# Patient Record
Sex: Female | Born: 1935 | Race: White | Hispanic: No | Smoking: Former smoker
Health system: Southern US, Community
[De-identification: ages and names within clinical notes are randomized; demographics above are authoritative.]

## PROBLEM LIST (undated history)

## (undated) DIAGNOSIS — K759 Inflammatory liver disease, unspecified: Secondary | ICD-10-CM

## (undated) DIAGNOSIS — F419 Anxiety disorder, unspecified: Secondary | ICD-10-CM

## (undated) DIAGNOSIS — K219 Gastro-esophageal reflux disease without esophagitis: Secondary | ICD-10-CM

## (undated) DIAGNOSIS — I10 Essential (primary) hypertension: Secondary | ICD-10-CM

## (undated) DIAGNOSIS — F32A Depression, unspecified: Secondary | ICD-10-CM

## (undated) DIAGNOSIS — M1990 Unspecified osteoarthritis, unspecified site: Secondary | ICD-10-CM

## (undated) DIAGNOSIS — J45909 Unspecified asthma, uncomplicated: Secondary | ICD-10-CM

## (undated) DIAGNOSIS — N390 Urinary tract infection, site not specified: Secondary | ICD-10-CM

## (undated) DIAGNOSIS — R011 Cardiac murmur, unspecified: Secondary | ICD-10-CM

## (undated) DIAGNOSIS — F329 Major depressive disorder, single episode, unspecified: Secondary | ICD-10-CM

## (undated) DIAGNOSIS — J189 Pneumonia, unspecified organism: Secondary | ICD-10-CM

## (undated) HISTORY — PX: OTHER SURGICAL HISTORY: SHX169

## (undated) HISTORY — PX: JOINT REPLACEMENT: SHX530

## (undated) HISTORY — PX: EYE SURGERY: SHX253

---

## 2013-10-04 DIAGNOSIS — J189 Pneumonia, unspecified organism: Secondary | ICD-10-CM

## 2013-10-04 HISTORY — DX: Pneumonia, unspecified organism: J18.9

## 2014-04-25 ENCOUNTER — Ambulatory Visit (INDEPENDENT_AMBULATORY_CARE_PROVIDER_SITE_OTHER): Payer: Medicare Other | Admitting: Sports Medicine

## 2014-04-25 ENCOUNTER — Telehealth: Payer: Self-pay | Admitting: *Deleted

## 2014-04-25 ENCOUNTER — Ambulatory Visit (INDEPENDENT_AMBULATORY_CARE_PROVIDER_SITE_OTHER): Payer: Medicare Other

## 2014-04-25 ENCOUNTER — Encounter: Payer: Self-pay | Admitting: Sports Medicine

## 2014-04-25 VITALS — BP 125/85 | HR 86 | Ht 66.0 in | Wt 167.0 lb

## 2014-04-25 DIAGNOSIS — M47816 Spondylosis without myelopathy or radiculopathy, lumbar region: Secondary | ICD-10-CM | POA: Insufficient documentation

## 2014-04-25 DIAGNOSIS — M47817 Spondylosis without myelopathy or radiculopathy, lumbosacral region: Secondary | ICD-10-CM

## 2014-04-25 DIAGNOSIS — M1710 Unilateral primary osteoarthritis, unspecified knee: Secondary | ICD-10-CM

## 2014-04-25 DIAGNOSIS — M25469 Effusion, unspecified knee: Secondary | ICD-10-CM

## 2014-04-25 DIAGNOSIS — M1712 Unilateral primary osteoarthritis, left knee: Secondary | ICD-10-CM | POA: Insufficient documentation

## 2014-04-25 DIAGNOSIS — M545 Low back pain, unspecified: Secondary | ICD-10-CM

## 2014-04-25 DIAGNOSIS — M439 Deforming dorsopathy, unspecified: Secondary | ICD-10-CM

## 2014-04-25 DIAGNOSIS — IMO0002 Reserved for concepts with insufficient information to code with codable children: Secondary | ICD-10-CM

## 2014-04-25 DIAGNOSIS — M5137 Other intervertebral disc degeneration, lumbosacral region: Secondary | ICD-10-CM

## 2014-04-25 DIAGNOSIS — Z299 Encounter for prophylactic measures, unspecified: Secondary | ICD-10-CM | POA: Insufficient documentation

## 2014-04-25 MED ORDER — PREGABALIN 300 MG PO CAPS: 300.0000 mg | ORAL_CAPSULE | Freq: Two times a day (BID) | ORAL | Status: DC

## 2014-04-25 MED ORDER — OXYCODONE-ACETAMINOPHEN 10-325 MG PO TABS: 1.0000 | ORAL_TABLET | Freq: Three times a day (TID) | ORAL | Status: DC | PRN

## 2014-04-25 NOTE — Telephone Encounter (Signed)
[facility] [id number] [facility] valid 04/25/14-06/09/14. [phone]. Corliss SkainsJamie Kaori Jumper, CMA

## 2014-04-25 NOTE — Progress Notes (Signed)
  Subjective:    CC: Establish care.   HPI:  Low back pain: Carney BernJean has a history of lumbar degenerative disc disease, she has had epidurals in the past in her radicular symptoms have resolved, she is unable to tell me where they were placed. Unfortunately she continues to have axial back pain it's worse when standing up and better with sitting and flexion. Pain is moderate, persistent without radiation.  Left knee osteoarthritis : history of a right knee replacement, continues to have pain and swelling in the left knee, moderate, persistent, hasn't had an injection for years. Pain is localized to the joint line, without mechanical symptoms.  Past medical history, Surgical history, Family history not pertinant except as noted below, Social history, Allergies, and medications have been entered into the medical record, reviewed, and no changes needed.   Review of Systems: No headache, visual changes, nausea, vomiting, diarrhea, constipation, dizziness, abdominal pain, skin rash, fevers, chills, night sweats, swollen lymph nodes, weight loss, chest pain, body aches, joint swelling, muscle aches, shortness of breath, mood changes, visual or auditory hallucinations.  Objective:    General: Well Developed, well nourished, and in no acute distress.  Neuro: Alert and oriented x3, extra-ocular muscles intact, sensation grossly intact.  HEENT: Normocephalic, atraumatic, pupils equal round reactive to light, neck supple, no masses, no lymphadenopathy, thyroid nonpalpable.  Skin: Warm and dry, no rashes noted.  Cardiac: Regular rate and rhythm, no murmurs rubs or gallops.  Respiratory: Clear to auscultation bilaterally. Not using accessory muscles, speaking in full sentences.  Abdominal: Soft, nontender, nondistended, positive bowel sounds, no masses, no organomegaly.  Left Knee: Normal to inspection with no erythema or effusion or obvious bony abnormalities. Palpation normal with no warmth or joint line  tenderness or patellar tenderness or condyle tenderness. ROM normal in flexion and extension and lower leg rotation. Ligaments with solid consistent endpoints including ACL, PCL, LCL, MCL. Negative Mcmurray's and provocative meniscal tests. Non painful patellar compression. Patellar and quadriceps tendons unremarkable. Hamstring and quadriceps strength is normal.  Procedure: Real-time Ultrasound Guided Injection of left knee Device: GE Logiq E  Verbal informed consent obtained.  Time-out conducted.  Noted no overlying erythema, induration, or other signs of local infection.  Skin prepped in a sterile fashion.  Local anesthesia: Topical Ethyl chloride.  With sterile technique and under real time ultrasound guidance:  2 cc kenalog 40, 4 cc lidocaine injected easily, syringe switched and 30 mg/2 mL of OrthoVisc (sodium hyaluronate) in a prefilled syringe was injected easily into the knee through a 22-gauge needle. Completed without difficulty  Pain immediately resolved suggesting accurate placement of the medication.  Advised to call if fevers/chills, erythema, induration, drainage, or persistent bleeding.  Images permanently stored and available for review in the ultrasound unit.  Impression: Technically successful ultrasound guided injection.  Impression and Recommendations:    The patient was counselled, risk factors were discussed, anticipatory guidance given.

## 2014-04-25 NOTE — Assessment & Plan Note (Signed)
Starting Visco supplementation and steroid injection today. Return in one week for OrthoVisc injection #2.

## 2014-04-25 NOTE — Assessment & Plan Note (Signed)
Symptoms do sound like spinal stenosis. She has been taking Lyrica and oxycodone. Advised to minimize use of Percocet, we will increase Lyrica 300 mg. We are going to also obtain a new MRI, she has been getting epidurals, I do suspect there is also an element of facet arthritis and spinal stenosis. Return to follow up results of the MRI.

## 2014-04-25 NOTE — Assessment & Plan Note (Signed)
We will discuss this at a future visit 

## 2014-05-02 ENCOUNTER — Ambulatory Visit (INDEPENDENT_AMBULATORY_CARE_PROVIDER_SITE_OTHER): Payer: Medicare Other | Admitting: Sports Medicine

## 2014-05-02 ENCOUNTER — Encounter: Payer: Self-pay | Admitting: Sports Medicine

## 2014-05-02 VITALS — BP 112/75 | HR 92 | Ht 66.0 in | Wt 171.0 lb

## 2014-05-02 DIAGNOSIS — M545 Low back pain, unspecified: Secondary | ICD-10-CM

## 2014-05-02 DIAGNOSIS — M1710 Unilateral primary osteoarthritis, unspecified knee: Secondary | ICD-10-CM

## 2014-05-02 DIAGNOSIS — M1712 Unilateral primary osteoarthritis, left knee: Secondary | ICD-10-CM

## 2014-05-02 NOTE — Progress Notes (Signed)
  Procedure:  Injection of left knee Consent obtained and verified. Time-out conducted. Noted no overlying erythema, induration, or other signs of local infection. Skin prepped in a sterile fashion. Topical analgesic spray: Ethyl chloride. Completed without difficulty. Meds: 30 mg/2 mL of OrthoVisc (sodium hyaluronate) in a prefilled syringe was injected easily into the knee through a 22-gauge needle. Pain immediately improved suggesting accurate placement of the medication. Advised to call if fevers/chills, erythema, induration, drainage, or persistent bleeding.  

## 2014-05-02 NOTE — Assessment & Plan Note (Signed)
Doing extremely well on increased dose of Lyrica, we should be able to discontinue her Percocet same. Awaiting MRI. If continues to have such pain relief with Lyrica we can avoid interventional treatment.

## 2014-05-02 NOTE — Assessment & Plan Note (Signed)
Currently pain-free. OrthoVisc injection #2 of 4. Return in one week for #3.

## 2014-05-10 ENCOUNTER — Ambulatory Visit (INDEPENDENT_AMBULATORY_CARE_PROVIDER_SITE_OTHER): Payer: Medicare Other | Admitting: Sports Medicine

## 2014-05-10 ENCOUNTER — Encounter: Payer: Self-pay | Admitting: Sports Medicine

## 2014-05-10 VITALS — BP 120/78 | HR 82 | Wt 172.0 lb

## 2014-05-10 DIAGNOSIS — R059 Cough, unspecified: Secondary | ICD-10-CM

## 2014-05-10 DIAGNOSIS — M1710 Unilateral primary osteoarthritis, unspecified knee: Secondary | ICD-10-CM

## 2014-05-10 DIAGNOSIS — M1712 Unilateral primary osteoarthritis, left knee: Secondary | ICD-10-CM

## 2014-05-10 DIAGNOSIS — Z299 Encounter for prophylactic measures, unspecified: Secondary | ICD-10-CM

## 2014-05-10 DIAGNOSIS — L821 Other seborrheic keratosis: Secondary | ICD-10-CM | POA: Insufficient documentation

## 2014-05-10 DIAGNOSIS — R05 Cough: Secondary | ICD-10-CM

## 2014-05-10 MED ORDER — GUAIFENESIN ER 1200 MG PO TB12: 1.0000 | ORAL_TABLET | Freq: Two times a day (BID) | ORAL | Status: DC

## 2014-05-10 NOTE — Progress Notes (Signed)
  Subjective:    CC: Knee arthritis  HPI: Left knee osteoarthritis: Doing well, here for third OrthoVisc injection.  Skin lesion: Localized on the face, present for years, not changing. Nontender.  Cough: Desires prescription for maximum strength Mucinex.  Past medical history, Surgical history, Family history not pertinant except as noted below, Social history, Allergies, and medications have been entered into the medical record, reviewed, and no changes needed.   Review of Systems: No fevers, chills, night sweats, weight loss, chest pain, or shortness of breath.   Objective:    General: Well Developed, well nourished, and in no acute distress.  Neuro: Alert and oriented x3, extra-ocular muscles intact, sensation grossly intact.  HEENT: Normocephalic, atraumatic, pupils equal round reactive to light, neck supple, no masses, no lymphadenopathy, thyroid nonpalpable.  Skin: Warm and dry, no rashes. Cardiac: Regular rate and rhythm, no murmurs rubs or gallops, no lower extremity edema.  Respiratory: Clear to auscultation bilaterally. Not using accessory muscles, speaking in full sentences.  Procedure: Real-time Ultrasound Guided Injection of left knee Device: GE Logiq E  Verbal informed consent obtained.  Time-out conducted.  Noted no overlying erythema, induration, or other signs of local infection.  Skin prepped in a sterile fashion.  Local anesthesia: Topical Ethyl chloride.  With sterile technique and under real time ultrasound guidance:   30 mg/2 mL of OrthoVisc (sodium hyaluronate) in a prefilled syringe was injected easily into the knee through a 22-gauge needle. Completed without difficulty  Pain immediately resolved suggesting accurate placement of the medication.  Advised to call if fevers/chills, erythema, induration, drainage, or persistent bleeding.  Images permanently stored and available for review in the ultrasound unit.  Impression: Technically successful ultrasound  guided injection.  Impression and Recommendations:

## 2014-05-10 NOTE — Assessment & Plan Note (Signed)
Cryotherapy at the next visit.

## 2014-05-10 NOTE — Assessment & Plan Note (Signed)
Calling in Mucinex per patient request.

## 2014-05-10 NOTE — Assessment & Plan Note (Signed)
Doing well, OrthoVisc injection #3. Return in one week for #4.

## 2014-05-15 ENCOUNTER — Telehealth: Payer: Self-pay

## 2014-05-15 NOTE — Telephone Encounter (Signed)
Patient daughter called stated that her mother was doubling up the Oxycodone and the Lyrica she has appt on 05/17/2014, she is requesting a refill on both medication. Please advise both Rx were filled on 04/25/2014. Rhonda Cunningham,CMA

## 2014-05-15 NOTE — Telephone Encounter (Signed)
No refills on the oxycodone, I am happy to help her down taper the medication, also it is too soon to refill the Lyrica, I will see her on the 14th.

## 2014-05-15 NOTE — Telephone Encounter (Signed)
Spoke to patient daughter gave her instructions as noted below. Kwinton Maahs,CMA

## 2014-05-17 ENCOUNTER — Encounter: Payer: Self-pay | Admitting: Sports Medicine

## 2014-05-17 ENCOUNTER — Ambulatory Visit (INDEPENDENT_AMBULATORY_CARE_PROVIDER_SITE_OTHER): Payer: Medicare Other | Admitting: Sports Medicine

## 2014-05-17 VITALS — BP 124/77 | HR 95 | Wt 174.0 lb

## 2014-05-17 DIAGNOSIS — L909 Atrophic disorder of skin, unspecified: Secondary | ICD-10-CM

## 2014-05-17 DIAGNOSIS — M1710 Unilateral primary osteoarthritis, unspecified knee: Secondary | ICD-10-CM

## 2014-05-17 DIAGNOSIS — M545 Low back pain, unspecified: Secondary | ICD-10-CM

## 2014-05-17 DIAGNOSIS — L821 Other seborrheic keratosis: Secondary | ICD-10-CM

## 2014-05-17 DIAGNOSIS — M1712 Unilateral primary osteoarthritis, left knee: Secondary | ICD-10-CM

## 2014-05-17 DIAGNOSIS — L918 Other hypertrophic disorders of the skin: Secondary | ICD-10-CM | POA: Insufficient documentation

## 2014-05-17 DIAGNOSIS — L919 Hypertrophic disorder of the skin, unspecified: Secondary | ICD-10-CM

## 2014-05-17 MED ORDER — PREGABALIN 300 MG PO CAPS
300.0000 mg | ORAL_CAPSULE | Freq: Three times a day (TID) | ORAL | Status: DC
Start: 2014-05-17 — End: 2014-05-21

## 2014-05-17 NOTE — Assessment & Plan Note (Signed)
OrthoVisc injection #4 into the left knee. Return in one month.

## 2014-05-17 NOTE — Progress Notes (Signed)
  Subjective:    CC:  Followup  HPI: Left knee osteoarthritis: Here for OrthoVisc injection #4, doing well.  Seborrheic keratoses: One on the right and left sides of her face, desires treatment.  Skin tags: Desires removal.  Low back pain: Tells me that they lost some of her Lyrica and her oxycodone, I advised I did refill the Lyrica but we would not be refilling her oxycodone, I want her off of this medication anyway.  Past medical history, Surgical history, Family history not pertinant except as noted below, Social history, Allergies, and medications have been entered into the medical record, reviewed, and no changes needed.   Review of Systems: No fevers, chills, night sweats, weight loss, chest pain, or shortness of breath.   Objective:    General: Well Developed, well nourished, and in no acute distress.  Neuro: Alert and oriented x3, extra-ocular muscles intact, sensation grossly intact.  HEENT: Normocephalic, atraumatic, pupils equal round reactive to light, neck supple, no masses, no lymphadenopathy, thyroid nonpalpable.  Skin: Warm and dry, no rashes. Several skin tags on the neck and two seborrheic keratoses on the face. Cardiac: Regular rate and rhythm, no murmurs rubs or gallops, no lower extremity edema.  Respiratory: Clear to auscultation bilaterally. Not using accessory muscles, speaking in full sentences.  Procedure: Real-time Ultrasound Guided Injection of left knee Device: GE Logiq E  Verbal informed consent obtained.  Time-out conducted.  Noted no overlying erythema, induration, or other signs of local infection.  Skin prepped in a sterile fashion.  Local anesthesia: Topical Ethyl chloride.  With sterile technique and under real time ultrasound guidance:  20 cc of straw-colored fluid was aspirated, switched and 30 mg/2 mL of OrthoVisc (sodium hyaluronate) in a prefilled syringe was injected easily into the knee through a 18-gauge needle. Completed without  difficulty  Pain immediately resolved suggesting accurate placement of the medication.  Advised to call if fevers/chills, erythema, induration, drainage, or persistent bleeding.  Images permanently stored and available for review in the ultrasound unit.  Impression: Technically successful ultrasound guided injection.  Procedure:  Cryodestruction of 2 seborrheic keratoses on the face. Consent obtained and verified. Time-out conducted. Noted no overlying erythema, induration, or other signs of local infection. Completed without difficulty using Cryo-Gun. Advised to call if fevers/chills, erythema, induration, drainage, or persistent bleeding.  Impression and Recommendations:

## 2014-05-17 NOTE — Assessment & Plan Note (Signed)
Cryotherapy of 2 seborrheic keratoses on her face.

## 2014-05-17 NOTE — Assessment & Plan Note (Signed)
She will make an appointment for excision at a future date.

## 2014-05-17 NOTE — Assessment & Plan Note (Signed)
Discontinue oxycodone, refilling Lyrica

## 2014-05-20 ENCOUNTER — Other Ambulatory Visit: Payer: Self-pay | Admitting: Sports Medicine

## 2014-05-21 ENCOUNTER — Other Ambulatory Visit: Payer: Self-pay | Admitting: Sports Medicine

## 2014-05-21 ENCOUNTER — Telehealth: Payer: Self-pay | Admitting: Sports Medicine

## 2014-05-21 DIAGNOSIS — M545 Low back pain, unspecified: Secondary | ICD-10-CM

## 2014-05-21 NOTE — Telephone Encounter (Signed)
Insurance company is only willing to pay for 300 mg twice a day, this is acceptable, I have already sent a message to the pharmacy, go and pick it up for use twice a day.

## 2014-05-21 NOTE — Telephone Encounter (Signed)
Ms. Ericka PontiffMontgomery called today and said that her mom is on Lyrica for back pain and now the insurance wants a authorization before the script can be filled. Ms. Ericka PontiffMontgomery stated that her mom has been without the medicine for a few days and can not get out of bed due to pain. She is asking if another pain medicine can be described for her mom until they can get the lyrica through the insurance. -cf

## 2014-05-22 ENCOUNTER — Telehealth: Payer: Self-pay | Admitting: *Deleted

## 2014-05-22 ENCOUNTER — Other Ambulatory Visit: Payer: Self-pay | Admitting: Sports Medicine

## 2014-05-22 ENCOUNTER — Telehealth: Payer: Self-pay

## 2014-05-22 ENCOUNTER — Ambulatory Visit (INDEPENDENT_AMBULATORY_CARE_PROVIDER_SITE_OTHER): Payer: Medicare Other

## 2014-05-22 DIAGNOSIS — M545 Low back pain, unspecified: Secondary | ICD-10-CM

## 2014-05-22 DIAGNOSIS — M5126 Other intervertebral disc displacement, lumbar region: Secondary | ICD-10-CM

## 2014-05-22 DIAGNOSIS — M47817 Spondylosis without myelopathy or radiculopathy, lumbosacral region: Secondary | ICD-10-CM

## 2014-05-22 MED ORDER — TRAMADOL HCL 50 MG PO TABS: ORAL_TABLET | ORAL | Status: DC

## 2014-05-22 NOTE — Telephone Encounter (Signed)
Cindy Briggs's script for Lyrica was denied from her insurance company. She describes that she needs something else to help her. Lyrica is not on her formulary. Please advise. Cindy Briggs, CMA

## 2014-05-22 NOTE — Telephone Encounter (Signed)
spoek to patient advised her about the lyrica and to take it twice a day.Bjorn Loser. Rhonda Cunningham,CMA

## 2014-05-22 NOTE — Telephone Encounter (Signed)
Rx for tramadol as in my box.

## 2014-05-22 NOTE — Telephone Encounter (Signed)
Spoke to patient advised her that no Oxycodone would be prescribed and she wanted to know if she could get any pain medication called in that will help with her back pain. Estelle JuneRhonda Jkwon Treptow,CMA '

## 2014-05-22 NOTE — Telephone Encounter (Signed)
This will be handled through the other phone note today.

## 2014-05-22 NOTE — Telephone Encounter (Signed)
We had discussed discontinuing the oxycodone, is not a good long-term solution, Lyrica is on the formulary but they will only cover it for use twice a day, she needs to use Lyrica twice a day, I can provide a new prescription if needed. They simply do not want to cover where at 3 times a day.

## 2014-05-22 NOTE — Telephone Encounter (Signed)
Rx has been faxed to [facility]. Madoline Bhatt,CMA  

## 2014-05-22 NOTE — Telephone Encounter (Signed)
Cindy Briggs was inquiring about a script for oxycodone to help with the back pain she experiences. She already picked up the Lyrica before I called her. Corliss SkainsJamie Mase Dhondt, CMA

## 2014-05-22 NOTE — Telephone Encounter (Signed)
Patient request a Rx for Oxycodone. Cindy Briggs,CMA

## 2014-05-22 NOTE — Telephone Encounter (Signed)
Lyrica is on the formulary but they will only cover it for use twice a day, she needs to use Lyrica twice a day, I can provide a new prescription if needed. They simply do not want to cover where at 3 times a day.

## 2014-05-27 ENCOUNTER — Ambulatory Visit (INDEPENDENT_AMBULATORY_CARE_PROVIDER_SITE_OTHER): Payer: Medicare Other | Admitting: Sports Medicine

## 2014-05-27 ENCOUNTER — Encounter: Payer: Self-pay | Admitting: Sports Medicine

## 2014-05-27 VITALS — BP 103/68 | HR 82 | Ht 66.0 in | Wt 172.0 lb

## 2014-05-27 DIAGNOSIS — L919 Hypertrophic disorder of the skin, unspecified: Secondary | ICD-10-CM

## 2014-05-27 DIAGNOSIS — M545 Low back pain, unspecified: Secondary | ICD-10-CM

## 2014-05-27 DIAGNOSIS — L909 Atrophic disorder of skin, unspecified: Secondary | ICD-10-CM

## 2014-05-27 DIAGNOSIS — L918 Other hypertrophic disorders of the skin: Secondary | ICD-10-CM

## 2014-05-27 NOTE — Progress Notes (Signed)
  Procedure:  Removal of  15 cutaneous skin tag(s). Risks, benefits, alternatives explained to patient. Consent obtained. Time out conducted. Noted no overlying induration or erythema at site of injection. A small amount of lidocaine with epinephrine infiltrated under the skin tag(s) for local anesthesia. Hemostat used to clamp the neck of the skin tag(s). Scalpel then used to excise the skin tag(s), and subsequent electrocautery with a Hyfrecator used to control minor bleeding. Antibiotic ointment applied. Wound dressed. Advised to return if increased redness, swelling, drainage, fevers, or chills.

## 2014-05-27 NOTE — Assessment & Plan Note (Signed)
Removal of approximately 15 skin tags around the neck, return as needed for this.

## 2014-05-27 NOTE — Assessment & Plan Note (Signed)
Return to go over MRI results and for interventional injection planning.

## 2014-06-03 ENCOUNTER — Ambulatory Visit: Payer: Medicare Other | Admitting: Sports Medicine

## 2014-06-13 ENCOUNTER — Ambulatory Visit (INDEPENDENT_AMBULATORY_CARE_PROVIDER_SITE_OTHER): Payer: Medicare Other | Admitting: Sports Medicine

## 2014-06-13 ENCOUNTER — Other Ambulatory Visit: Payer: Self-pay | Admitting: Sports Medicine

## 2014-06-13 ENCOUNTER — Encounter: Payer: Self-pay | Admitting: Sports Medicine

## 2014-06-13 VITALS — BP 106/69 | HR 91 | Wt 174.0 lb

## 2014-06-13 DIAGNOSIS — M545 Low back pain, unspecified: Secondary | ICD-10-CM

## 2014-06-13 DIAGNOSIS — M1710 Unilateral primary osteoarthritis, unspecified knee: Secondary | ICD-10-CM

## 2014-06-13 DIAGNOSIS — M1712 Unilateral primary osteoarthritis, left knee: Secondary | ICD-10-CM

## 2014-06-13 NOTE — Progress Notes (Signed)
  Subjective:    CC: Followup MRI  HPI: He has severe scoliosis and lumbar radiculitis, bilateral but left worse than right, radiating down the left leg and L4 versus L5 distribution, she doesn't worse a burning searing sensation in the left anterolateral lower leg as well as in the heel and bottom of her foot. She has failed physical therapy, steroids, NSAIDs, muscle relaxers, Lyrica.  Past medical history, Surgical history, Family history not pertinant except as noted below, Social history, Allergies, and medications have been entered into the medical record, reviewed, and no changes needed.   Review of Systems: No fevers, chills, night sweats, weight loss, chest pain, or shortness of breath.   Objective:    General: Well Developed, well nourished, and in no acute distress.  Neuro: Alert and oriented x3, extra-ocular muscles intact, sensation grossly intact.  HEENT: Normocephalic, atraumatic, pupils equal round reactive to light, neck supple, no masses, no lymphadenopathy, thyroid nonpalpable.  Skin: Warm and dry, no rashes. Cardiac: Regular rate and rhythm, no murmurs rubs or gallops, no lower extremity edema.  Respiratory: Clear to auscultation bilaterally. Not using accessory muscles, speaking in full sentences.  Lumbar spine MRI shows severe scoliosis with multilevel degenerative changes and facet arthrosis and nearly every level. Certainly there is a left-sided L5-S1 disc protrusion with a foraminal component.  Impression and Recommendations:

## 2014-06-13 NOTE — Assessment & Plan Note (Signed)
Persistent left-sided L5 radicular symptoms, at this point she is failed conservative measures and we are going to proceed with an L5-S1 interlaminar epidural. Return to see me 2 weeks after the injection.

## 2014-06-13 NOTE — Assessment & Plan Note (Signed)
Overall doing well after OrthoVisc.

## 2014-06-17 ENCOUNTER — Ambulatory Visit
Admission: RE | Admit: 2014-06-17 | Discharge: 2014-06-17 | Disposition: A | Discharge status: Home / Self Care | Payer: Medicare Other | Source: Ambulatory Visit | Attending: Sports Medicine | Admitting: Sports Medicine

## 2014-06-17 VITALS — BP 132/64 | HR 79

## 2014-06-17 DIAGNOSIS — M5442 Lumbago with sciatica, left side: Secondary | ICD-10-CM

## 2014-06-17 MED ORDER — METHYLPREDNISOLONE ACETATE 40 MG/ML INJ SUSP (RADIOLOG
120.0000 mg | Freq: Once | INTRAMUSCULAR | Status: AC
  Administered 2014-06-17: 120 mg via EPIDURAL

## 2014-06-17 MED ORDER — IOHEXOL 180 MG/ML  SOLN
1.0000 mL | Freq: Once | INTRAMUSCULAR | Status: AC | PRN
  Administered 2014-06-17: 1 mL via EPIDURAL

## 2014-06-17 NOTE — Discharge Instructions (Signed)

## 2014-06-27 ENCOUNTER — Telehealth: Payer: Self-pay

## 2014-06-27 DIAGNOSIS — M545 Low back pain, unspecified: Secondary | ICD-10-CM

## 2014-06-27 MED ORDER — PREGABALIN 300 MG PO CAPS: 300.0000 mg | ORAL_CAPSULE | Freq: Two times a day (BID) | ORAL | Status: DC

## 2014-06-27 NOTE — Telephone Encounter (Signed)
Patient request refill for Lyrica. Rhonda Cunningham,CMA   

## 2014-06-27 NOTE — Telephone Encounter (Signed)
Rx in box. 

## 2014-06-28 NOTE — Telephone Encounter (Signed)
Rx has been faxed to [facility]. Mercy Malena,CMA  

## 2014-07-05 ENCOUNTER — Other Ambulatory Visit: Payer: Self-pay | Admitting: Sports Medicine

## 2014-07-06 ENCOUNTER — Other Ambulatory Visit: Payer: Self-pay | Admitting: Sports Medicine

## 2014-07-11 ENCOUNTER — Ambulatory Visit: Payer: Medicare Other | Admitting: Sports Medicine

## 2014-07-15 ENCOUNTER — Telehealth: Payer: Self-pay

## 2014-07-15 MED ORDER — TRAMADOL HCL 50 MG PO TABS: 50.0000 mg | ORAL_TABLET | Freq: Three times a day (TID) | ORAL | Status: DC | PRN

## 2014-07-15 NOTE — Telephone Encounter (Signed)
Patient daughter has been informed. Alvy Alsop,CMA 0-

## 2014-07-15 NOTE — Telephone Encounter (Signed)
Patient  daughter called wanted a refill of Tramadol for her mother. I advised patient that 90 tablets were given on 06/13/2014 and it was too early to refill patient stated that she will continue to give her Ibuprofen. Rhonda Cunningham,CMA

## 2014-07-15 NOTE — Telephone Encounter (Signed)
Tramadol is ok, it was written as 1-2 tabs TID, so #90 should last her about a month.  I will refill it.

## 2014-07-29 ENCOUNTER — Other Ambulatory Visit: Payer: Self-pay | Admitting: Sports Medicine

## 2014-07-29 ENCOUNTER — Other Ambulatory Visit: Payer: Self-pay

## 2014-07-29 DIAGNOSIS — M545 Low back pain, unspecified: Secondary | ICD-10-CM

## 2014-07-29 MED ORDER — PREGABALIN 300 MG PO CAPS: 300.0000 mg | ORAL_CAPSULE | Freq: Two times a day (BID) | ORAL | Status: DC

## 2014-07-31 ENCOUNTER — Ambulatory Visit (INDEPENDENT_AMBULATORY_CARE_PROVIDER_SITE_OTHER): Payer: Medicare Other | Admitting: Sports Medicine

## 2014-07-31 ENCOUNTER — Encounter: Payer: Self-pay | Admitting: Sports Medicine

## 2014-07-31 VITALS — BP 114/74 | HR 95 | Ht 66.0 in | Wt 170.0 lb

## 2014-07-31 DIAGNOSIS — M1712 Unilateral primary osteoarthritis, left knee: Secondary | ICD-10-CM

## 2014-07-31 DIAGNOSIS — M79671 Pain in right foot: Secondary | ICD-10-CM | POA: Insufficient documentation

## 2014-07-31 DIAGNOSIS — M47896 Other spondylosis, lumbar region: Secondary | ICD-10-CM

## 2014-07-31 DIAGNOSIS — M79672 Pain in left foot: Secondary | ICD-10-CM

## 2014-07-31 NOTE — Assessment & Plan Note (Signed)
Good response to radicular symptoms with a left-sided L5-S1 interlaminar epidural, radicular symptoms have resolved. She does have multilevel facet arthritis, and persistent axial low back pain worse with extension. Next line we are now going to proceed with facet blocks of the lower 3 levels. We will be looking for concordant pain. Return to see me to go over experience 2 weeks after injection, and eligibility for medial branch blocks and radiofrequency ablation.

## 2014-07-31 NOTE — Assessment & Plan Note (Signed)
Failed steroid injections and viscous supplementation. At this point she is a candidate for total knee arthroplasty. She would be a candidate for mesenchymal stem cell implantation here in the office however this is too expensive at this time.

## 2014-07-31 NOTE — Assessment & Plan Note (Signed)
Return for custom orthotics and likely trimming of hypertrophic calluses.

## 2014-07-31 NOTE — Progress Notes (Signed)
  Subjective:    CC: Followup  HPI: Carney BernJean returns, she had an L5/S1 epidural, she reports relief of her radicular pain but persistent axial symptoms.  Pain is moderate, persistent, worse with standing.  Knee OA:  Only had a month or so of response to viscosupplementation.  Now amenable to considering TKA.  Bilateral foot pain:  Has been seeing a podiatrist, orthotics are ~78 years old.  They have been shaving hypertrophic callouses.  Amenable to continue care here for that.  Past medical history, Surgical history, Family history not pertinant except as noted below, Social history, Allergies, and medications have been entered into the medical record, reviewed, and no changes needed.   Review of Systems: No fevers, chills, night sweats, weight loss, chest pain, or shortness of breath.   Objective:    General: Well Developed, well nourished, and in no acute distress.  Neuro: Alert and oriented x3, extra-ocular muscles intact, sensation grossly intact.  HEENT: Normocephalic, atraumatic, pupils equal round reactive to light, neck supple, no masses, no lymphadenopathy, thyroid nonpalpable.  Skin: Warm and dry, no rashes. Cardiac: Regular rate and rhythm, no murmurs rubs or gallops, no lower extremity edema.  Respiratory: Clear to auscultation bilaterally. Not using accessory muscles, speaking in full sentences.  Impression and Recommendations:

## 2014-08-01 ENCOUNTER — Other Ambulatory Visit: Payer: Self-pay | Admitting: Sports Medicine

## 2014-08-06 DIAGNOSIS — Z96651 Presence of right artificial knee joint: Secondary | ICD-10-CM | POA: Diagnosis not present

## 2014-08-06 DIAGNOSIS — M1712 Unilateral primary osteoarthritis, left knee: Secondary | ICD-10-CM | POA: Diagnosis not present

## 2014-08-20 ENCOUNTER — Ambulatory Visit (INDEPENDENT_AMBULATORY_CARE_PROVIDER_SITE_OTHER): Payer: Medicare Other | Admitting: Sports Medicine

## 2014-08-20 ENCOUNTER — Encounter: Payer: Self-pay | Admitting: Sports Medicine

## 2014-08-20 VITALS — BP 102/67 | HR 91 | Ht 66.0 in | Wt 173.0 lb

## 2014-08-20 DIAGNOSIS — M79671 Pain in right foot: Secondary | ICD-10-CM

## 2014-08-20 DIAGNOSIS — M79672 Pain in left foot: Secondary | ICD-10-CM

## 2014-08-20 NOTE — Assessment & Plan Note (Signed)
Custom orthotics as above. Return in one month. 

## 2014-08-20 NOTE — Progress Notes (Signed)

## 2014-09-02 ENCOUNTER — Other Ambulatory Visit: Payer: Self-pay | Admitting: Sports Medicine

## 2014-09-03 ENCOUNTER — Other Ambulatory Visit: Payer: Self-pay | Admitting: Sports Medicine

## 2014-09-05 ENCOUNTER — Inpatient Hospital Stay (HOSPITAL_COMMUNITY)
Admission: RE | Admit: 2014-09-05 | Discharge: 2014-09-05 | Disposition: A | Discharge status: Home / Self Care | Payer: Medicare Other | Source: Ambulatory Visit

## 2014-09-05 ENCOUNTER — Other Ambulatory Visit: Payer: Self-pay | Admitting: Orthopedic Surgery

## 2014-09-05 NOTE — Pre-Procedure Instructions (Signed)
Earnstine RegalJean Chap  09/05/2014   Your procedure is scheduled on:  Friday, Dec. 11th   [facility]:30 AM.   Call this number if you have problems the morning of surgery: [phone]   Remember:   Do not eat food or drink liquids after midnight Thursday.   Take these medicines the morning of surgery with A SIP OF WATER: Cymbalta, Hydrocodone, Prevacid, Lyrica               STOP TAKING ANY ANTI-INFLAMMATORIES, ASPIRIN, HERBAL MEDICATIONS 4-5 DAYS PRIOR TO SURGERY.   Do not wear jewelry, make-up or nail polish.  Do not wear lotions, powders, or perfumes. You may wear deodorant.  Do not shave underarms & legs 48 hours prior to surgery.   Do not bring valuables to the hospital.  [facility] not [facility] for any belongings or valuables.               Contacts, dentures or bridgework may not be worn into surgery.  Leave suitcase in the car. After surgery it may be brought to your room.  For patients admitted to the hospital, discharge time is determined by your treatment team.    Name and phone number of your driver:    Special Instructions: "Preparing for Surgery" instruction sheet.   Please read over the following fact sheets that you were given: Pain Booklet, Coughing and Deep Breathing, MRSA Information and Surgical Site Infection Prevention

## 2014-09-16 ENCOUNTER — Telehealth: Payer: Self-pay

## 2014-09-16 ENCOUNTER — Other Ambulatory Visit: Payer: Self-pay | Admitting: Sports Medicine

## 2014-09-16 NOTE — Telephone Encounter (Signed)
Gabapentin, or amitriptyline.

## 2014-09-16 NOTE — Telephone Encounter (Signed)
Patient stopped paying her medical insurance and they have discontinued her coverage. She cannot afford Lyrica. Is there something cheaper?

## 2014-09-17 NOTE — Telephone Encounter (Signed)
Patient advised. She will call back after she checks the price.

## 2014-10-03 ENCOUNTER — Other Ambulatory Visit: Payer: Self-pay | Admitting: Sports Medicine

## 2014-10-14 ENCOUNTER — Inpatient Hospital Stay (HOSPITAL_COMMUNITY): Admission: RE | Admit: 2014-10-14 | Payer: Medicare Other | Source: Ambulatory Visit

## 2014-10-16 ENCOUNTER — Encounter (HOSPITAL_COMMUNITY): Payer: Self-pay

## 2014-10-16 ENCOUNTER — Encounter (HOSPITAL_COMMUNITY)
Admission: RE | Admit: 2014-10-16 | Discharge: 2014-10-16 | Disposition: A | Discharge status: Home / Self Care | Payer: Medicare Other | Source: Ambulatory Visit | Attending: Orthopedic Surgery | Admitting: Orthopedic Surgery

## 2014-10-16 HISTORY — DX: Inflammatory liver disease, unspecified: K75.9

## 2014-10-16 HISTORY — DX: Unspecified asthma, uncomplicated: J45.909

## 2014-10-16 HISTORY — DX: Gastro-esophageal reflux disease without esophagitis: K21.9

## 2014-10-16 HISTORY — DX: Essential (primary) hypertension: I10

## 2014-10-16 HISTORY — DX: Unspecified osteoarthritis, unspecified site: M19.90

## 2014-10-16 NOTE — Progress Notes (Signed)
Pt called and states that she will not be able to make it today for her pre-op visit. Instructions given over the phone along with health history taken. Pt Voiced understanding of instructions. Informed her she will still need to come in for lab work.  PCP is Dr Fulton Reekhekkekardam Denies seeing a cardiologist. Denies ever having a card cath, stress test, or echo.

## 2014-10-16 NOTE — Pre-Procedure Instructions (Signed)
Earnstine RegalJean Waldschmidt  10/16/2014   Your procedure is scheduled on:  Jan. 22  [facility] at 800[facility]  Call this number if you have problems the morning of surgery: [phone]   Remember:   Do not eat food or drink liquids after midnight.   Take these medicines the morning of surgery with A SIP OF WATER: Chlorthalidone (Hygroton), Duloxetine (Cymbalta), Hydrocodone-acetaminophen (Norco) or tramadol (Ultram) if needed, Ondansetron (Zofran) if needed, pregabalin (Lyrica)  Stop taking Aspirin, Ibuprofen, Aleve, BC's, Goody's Herbal medications, Fish Oil, Meloxicam (mobic) 7 days before your surgery.    Do not wear jewelry, make-up or nail polish.  Do not wear lotions, powders, or perfumes. You may wear deodorant.  Do not shave 48 hours prior to surgery. Men may shave face and neck.  Do not bring valuables to the hospital.  [facility] is not responsible  for any belongings or valuables.               Contacts, dentures or bridgework may not be worn into surgery.  Leave suitcase in the car. After surgery it may be brought to your room.  For patients admitted to the hospital, discharge time is determined by your  treatment team.               Patients discharged the day of surgery will not be allowed to drive home.    Special Instructions: [place] - Preparing for Surgery  Before surgery, you can play an important role.  Because skin is not sterile, your skin needs to be as free of germs as possible.  You can reduce the number of germs on you skin by washing with CHG (chlorahexidine gluconate) soap before surgery.  CHG is an antiseptic cleaner which kills germs and bonds with the skin to continue killing germs even after washing.  Please DO NOT use if you have an allergy to CHG or antibacterial soaps.  If your skin becomes reddened/irritated stop using the CHG and inform your nurse when you arrive at Short Stay.  Do not shave (including legs and underarms) for at least  48 hours prior to the first CHG shower.  You may shave your face.  Please follow these instructions carefully:   1.  Shower with CHG Soap the night before surgery and the  morning of Surgery.  2.  If you choose to wash your hair, wash your hair first as usual with your normal shampoo.  3.  After you shampoo, rinse your hair and body thoroughly to remove the  Shampoo.  4.  Use CHG as you would any other liquid soap.  You can apply chg directly to the skin and wash gently with scrungie or a clean washcloth.  5.  Apply the CHG Soap to your body ONLY FROM THE NECK DOWN. Do not use on open wounds or open sores.  Avoid contact with your eyes, ears, mouth and genitals (private parts).  Wash genitals (private parts)       with your normal soap.  6.  Wash thoroughly, paying special attention to the area where your surgery  will be performed.  7.  Thoroughly rinse your body with warm water from the neck down.  8.  DO NOT shower/wash with your normal soap after using and rinsing off the CHG Soap.  9.  Pat yourself dry with a clean towel.            10.  Wear clean pajamas.  11.  Place clean sheets on your bed the night of your first shower and do not sleep with pets.  Day of Surgery  Do not apply any lotions/deoderants the morning of surgery.  Please wear clean clothes to the hospital/surgery center.      Please read over the following fact sheets that you were given: Pain Booklet, Coughing and Deep Breathing, Blood Transfusion Information, MRSA Information and Surgical Site Infection Prevention

## 2014-10-17 ENCOUNTER — Ambulatory Visit: Payer: Medicare Other | Admitting: Sports Medicine

## 2014-10-17 DIAGNOSIS — Z0289 Encounter for other administrative examinations: Secondary | ICD-10-CM

## 2014-10-18 ENCOUNTER — Ambulatory Visit (HOSPITAL_COMMUNITY)
Admission: RE | Admit: 2014-10-18 | Discharge: 2014-10-18 | Disposition: A | Discharge status: Home / Self Care | Payer: Medicare Other | Source: Ambulatory Visit | Attending: Orthopedic Surgery | Admitting: Orthopedic Surgery

## 2014-10-18 ENCOUNTER — Encounter (HOSPITAL_COMMUNITY): Payer: Self-pay

## 2014-10-18 ENCOUNTER — Encounter (HOSPITAL_COMMUNITY)
Admission: RE | Admit: 2014-10-18 | Discharge: 2014-10-18 | Disposition: A | Discharge status: Home / Self Care | Payer: Medicare Other | Source: Ambulatory Visit | Attending: Orthopedic Surgery | Admitting: Orthopedic Surgery

## 2014-10-18 DIAGNOSIS — M1712 Unilateral primary osteoarthritis, left knee: Secondary | ICD-10-CM | POA: Insufficient documentation

## 2014-10-18 DIAGNOSIS — Z01818 Encounter for other preprocedural examination: Secondary | ICD-10-CM | POA: Diagnosis not present

## 2014-10-18 DIAGNOSIS — J984 Other disorders of lung: Secondary | ICD-10-CM | POA: Diagnosis not present

## 2014-10-18 LAB — URINALYSIS, ROUTINE W REFLEX MICROSCOPIC
Bilirubin Urine: NEGATIVE
Glucose, UA: NEGATIVE mg/dL
Hgb urine dipstick: NEGATIVE
Ketones, ur: NEGATIVE mg/dL
LEUKOCYTES UA: NEGATIVE
Nitrite: NEGATIVE
PH: 5 (ref 5.0–8.0)
Protein, ur: NEGATIVE mg/dL
Specific Gravity, Urine: 1.019 (ref 1.005–1.030)
UROBILINOGEN UA: 0.2 mg/dL (ref 0.0–1.0)

## 2014-10-18 LAB — CBC WITH DIFFERENTIAL/PLATELET
BASOS PCT: 1 % (ref 0–1)
Basophils Absolute: 0.1 10*3/uL (ref 0.0–0.1)
Eosinophils Absolute: 0.4 10*3/uL (ref 0.0–0.7)
Eosinophils Relative: 5 % (ref 0–5)
HEMATOCRIT: 44.8 % (ref 36.0–46.0)
Hemoglobin: 14.6 g/dL (ref 12.0–15.0)
LYMPHS PCT: 28 % (ref 12–46)
Lymphs Abs: 2.4 10*3/uL (ref 0.7–4.0)
MCH: 29.8 pg (ref 26.0–34.0)
MCHC: 32.6 g/dL (ref 30.0–36.0)
MCV: 91.4 fL (ref 78.0–100.0)
Monocytes Absolute: 0.5 10*3/uL (ref 0.1–1.0)
Monocytes Relative: 6 % (ref 3–12)
Neutro Abs: 5.1 10*3/uL (ref 1.7–7.7)
Neutrophils Relative %: 60 % (ref 43–77)
PLATELETS: 296 10*3/uL (ref 150–400)
RBC: 4.9 MIL/uL (ref 3.87–5.11)
RDW: 14 % (ref 11.5–15.5)
WBC: 8.4 10*3/uL (ref 4.0–10.5)

## 2014-10-18 LAB — COMPREHENSIVE METABOLIC PANEL
ALBUMIN: 4.1 g/dL (ref 3.5–5.2)
ALT: 16 U/L (ref 0–35)
AST: 25 U/L (ref 0–37)
Alkaline Phosphatase: 107 U/L (ref 39–117)
Anion gap: 12 (ref 5–15)
BILIRUBIN TOTAL: 0.5 mg/dL (ref 0.3–1.2)
BUN: 21 mg/dL (ref 6–23)
CO2: 28 mmol/L (ref 19–32)
CREATININE: 0.89 mg/dL (ref 0.50–1.10)
Calcium: 9.8 mg/dL (ref 8.4–10.5)
Chloride: 104 mEq/L (ref 96–112)
GFR calc Af Amer: 69 mL/min — ABNORMAL LOW (ref >90)
GFR, EST NON AFRICAN AMERICAN: 59 mL/min — AB (ref >90)
GLUCOSE: 101 mg/dL — AB (ref 70–99)
Potassium: 3.8 mmol/L (ref 3.5–5.1)
Sodium: 144 mmol/L (ref 135–145)
Total Protein: 6.5 g/dL (ref 6.0–8.3)

## 2014-10-18 LAB — ABO/RH: ABO/RH(D): O POS

## 2014-10-18 LAB — APTT: APTT: 42 s — AB (ref 24–37)

## 2014-10-18 LAB — SURGICAL PCR SCREEN
MRSA, PCR: NEGATIVE
STAPHYLOCOCCUS AUREUS: NEGATIVE

## 2014-10-18 LAB — PROTIME-INR
INR: 1.05 (ref 0.00–1.49)
PROTHROMBIN TIME: 13.8 s (ref 11.6–15.2)

## 2014-10-18 LAB — TYPE AND SCREEN
ABO/RH(D): O POS
ANTIBODY SCREEN: NEGATIVE

## 2014-10-18 MED ORDER — CHLORHEXIDINE GLUCONATE 4 % EX LIQD: 60.0000 mL | Freq: Once | CUTANEOUS | Status: DC

## 2014-10-19 ENCOUNTER — Other Ambulatory Visit: Payer: Self-pay | Admitting: Sports Medicine

## 2014-10-21 ENCOUNTER — Encounter (HOSPITAL_COMMUNITY): Payer: Self-pay | Admitting: Anesthesiology

## 2014-10-21 ENCOUNTER — Encounter (HOSPITAL_COMMUNITY): Payer: Self-pay | Admitting: Vascular Surgery

## 2014-10-21 NOTE — Progress Notes (Signed)
Anesthesia Chart Review:  Patient is a 81 year old female scheduled for left TKR on 10/25/14 by Dr. Luiz BlareGraves.  History includes recent former smoker (quit 10/09/14), HTN, asthma, GERD, arthritis, hepatitis B, right TKA. PCP is [facility]    10/18/14 EKG: NSR, minimal voltage for LVH, may be a normal variant. Anterior infarct (age undetermined).  Currently, there is no comparison EKGs available. No CV symptoms documented from her PAT visit.   10/18/14 CXR: 1. No radiographic evidence of acute cardiopulmonary disease. 2. Nodular density in the right mid to upper lung. This could represent an area of scarring, however, the possibility of a neoplastic nodule warrants consideration. Comparison with any prior outside chest x-rays is recommended. If none are available, further evaluation with noncontrast chest CT would be suggested in the near future. 3. Atherosclerosis in the thoracic aorta. 4. Old granulomatous disease. I spoke with Darl PikesSusan at Dr. Luiz BlareGraves' office.  They are aware of results and will try to schedule her CT prior to her surgery date--otherwise may be done during her admission.  Preoperative labs noted.  PT/INR WNL.  PTT elevated at 42.  PLT count, AST/ALT WNL. Will recheck PTT on the day of surgery to confirm. No anti-coagulation medications (only ASA 81 mg) are listed on her medication list.  No reported blood dyscrasias.    If no acute cardiopulmonary symptoms then anesthesiology does not typically require preoperative chest CT for evaluations of possible lung nodules prior to surgery, so will defer timing of further evaluation and recommendations for her abnormal chest CT to Dr. Luiz BlareGraves.    Velna Ochsllison Glynna Failla, PA-C [facility]/Anesthesiology Phone [phone] 10/21/2014 6:45 PM

## 2014-10-23 ENCOUNTER — Other Ambulatory Visit: Payer: Self-pay | Admitting: Orthopedic Surgery

## 2014-10-23 DIAGNOSIS — R9389 Abnormal findings on diagnostic imaging of other specified body structures: Secondary | ICD-10-CM

## 2014-10-24 ENCOUNTER — Ambulatory Visit
Admission: RE | Admit: 2014-10-24 | Discharge: 2014-10-24 | Disposition: A | Discharge status: Home / Self Care | Payer: Medicare Other | Source: Ambulatory Visit | Attending: Orthopedic Surgery | Admitting: Orthopedic Surgery

## 2014-10-24 DIAGNOSIS — J8410 Pulmonary fibrosis, unspecified: Secondary | ICD-10-CM | POA: Diagnosis not present

## 2014-10-24 DIAGNOSIS — R9389 Abnormal findings on diagnostic imaging of other specified body structures: Secondary | ICD-10-CM

## 2014-10-24 DIAGNOSIS — R911 Solitary pulmonary nodule: Secondary | ICD-10-CM | POA: Diagnosis not present

## 2014-10-24 DIAGNOSIS — R918 Other nonspecific abnormal finding of lung field: Secondary | ICD-10-CM | POA: Diagnosis not present

## 2014-10-24 MED ORDER — CEFAZOLIN SODIUM-DEXTROSE 2-3 GM-% IV SOLR: 2.0000 g | INTRAVENOUS | Status: DC

## 2014-10-25 ENCOUNTER — Inpatient Hospital Stay (HOSPITAL_COMMUNITY): Admission: RE | Admit: 2014-10-25 | Payer: Medicare Other | Source: Ambulatory Visit | Admitting: Orthopedic Surgery

## 2014-10-25 ENCOUNTER — Encounter (HOSPITAL_COMMUNITY): Admission: RE | Payer: Self-pay | Source: Ambulatory Visit

## 2014-10-25 SURGERY — ARTHROPLASTY, KNEE, TOTAL
Anesthesia: General | Site: Knee | Laterality: Left

## 2014-10-28 ENCOUNTER — Telehealth: Payer: Self-pay | Admitting: Sports Medicine

## 2014-10-28 ENCOUNTER — Encounter (HOSPITAL_COMMUNITY)
Admission: AD | Disposition: A | Discharge status: Home / Self Care | Payer: Self-pay | Source: Ambulatory Visit | Attending: Orthopedic Surgery

## 2014-10-28 ENCOUNTER — Ambulatory Visit (HOSPITAL_COMMUNITY)
Admission: AD | Admit: 2014-10-28 | Discharge: 2014-10-28 | Disposition: A | Discharge status: Home / Self Care | Payer: Medicare Other | Source: Ambulatory Visit | Attending: Orthopedic Surgery | Admitting: Orthopedic Surgery

## 2014-10-28 ENCOUNTER — Encounter: Payer: Self-pay | Admitting: Sports Medicine

## 2014-10-28 ENCOUNTER — Encounter (HOSPITAL_COMMUNITY): Payer: Self-pay | Admitting: Certified Registered Nurse Anesthetist

## 2014-10-28 DIAGNOSIS — M1712 Unilateral primary osteoarthritis, left knee: Secondary | ICD-10-CM | POA: Insufficient documentation

## 2014-10-28 DIAGNOSIS — R911 Solitary pulmonary nodule: Secondary | ICD-10-CM | POA: Insufficient documentation

## 2014-10-28 DIAGNOSIS — Z87891 Personal history of nicotine dependence: Secondary | ICD-10-CM | POA: Diagnosis not present

## 2014-10-28 DIAGNOSIS — Z79899 Other long term (current) drug therapy: Secondary | ICD-10-CM | POA: Diagnosis not present

## 2014-10-28 DIAGNOSIS — Z96651 Presence of right artificial knee joint: Secondary | ICD-10-CM | POA: Insufficient documentation

## 2014-10-28 DIAGNOSIS — M25562 Pain in left knee: Secondary | ICD-10-CM | POA: Diagnosis present

## 2014-10-28 DIAGNOSIS — Z91040 Latex allergy status: Secondary | ICD-10-CM | POA: Diagnosis not present

## 2014-10-28 DIAGNOSIS — Z5309 Procedure and treatment not carried out because of other contraindication: Secondary | ICD-10-CM | POA: Insufficient documentation

## 2014-10-28 DIAGNOSIS — K219 Gastro-esophageal reflux disease without esophagitis: Secondary | ICD-10-CM | POA: Diagnosis not present

## 2014-10-28 DIAGNOSIS — I10 Essential (primary) hypertension: Secondary | ICD-10-CM | POA: Diagnosis not present

## 2014-10-28 DIAGNOSIS — N390 Urinary tract infection, site not specified: Secondary | ICD-10-CM | POA: Insufficient documentation

## 2014-10-28 DIAGNOSIS — J45909 Unspecified asthma, uncomplicated: Secondary | ICD-10-CM | POA: Insufficient documentation

## 2014-10-28 DIAGNOSIS — Z7982 Long term (current) use of aspirin: Secondary | ICD-10-CM | POA: Insufficient documentation

## 2014-10-28 LAB — URINE MICROSCOPIC-ADD ON

## 2014-10-28 LAB — SYNOVIAL CELL COUNT + DIFF, W/ CRYSTALS
Crystals, Fluid: NONE SEEN
Eosinophils-Synovial: NONE SEEN % (ref 0–1)
LYMPHOCYTES-SYNOVIAL FLD: 7 % (ref 0–20)
Monocyte-Macrophage-Synovial Fluid: 24 % — ABNORMAL LOW (ref 50–90)
Neutrophil, Synovial: 69 % — ABNORMAL HIGH (ref 0–25)
WBC, Synovial: 7963 /mm3 — ABNORMAL HIGH (ref 0–200)

## 2014-10-28 LAB — POCT I-STAT 4, (NA,K, GLUC, HGB,HCT)
GLUCOSE: 103 mg/dL — AB (ref 70–99)
HCT: 37 % (ref 36.0–46.0)
HEMOGLOBIN: 12.6 g/dL (ref 12.0–15.0)
Potassium: 3.9 mmol/L (ref 3.5–5.1)
Sodium: 138 mmol/L (ref 135–145)

## 2014-10-28 LAB — URINALYSIS, ROUTINE W REFLEX MICROSCOPIC
Bilirubin Urine: NEGATIVE
Glucose, UA: NEGATIVE mg/dL
Hgb urine dipstick: NEGATIVE
KETONES UR: NEGATIVE mg/dL
Nitrite: NEGATIVE
PH: 7 (ref 5.0–8.0)
Protein, ur: NEGATIVE mg/dL
Specific Gravity, Urine: 1.011 (ref 1.005–1.030)
Urobilinogen, UA: 0.2 mg/dL (ref 0.0–1.0)

## 2014-10-28 LAB — POCT I-STAT, CHEM 8
BUN: 10 mg/dL (ref 6–23)
CALCIUM ION: 0.99 mmol/L — AB (ref 1.13–1.30)
Chloride: 100 mmol/L (ref 96–112)
Creatinine, Ser: 0.8 mg/dL (ref 0.50–1.10)
GLUCOSE: 103 mg/dL — AB (ref 70–99)
HEMATOCRIT: 38 % (ref 36.0–46.0)
HEMOGLOBIN: 12.9 g/dL (ref 12.0–15.0)
POTASSIUM: 3.9 mmol/L (ref 3.5–5.1)
Sodium: 137 mmol/L (ref 135–145)
TCO2: 26 mmol/L (ref 0–100)

## 2014-10-28 LAB — GRAM STAIN: Special Requests: NORMAL

## 2014-10-28 LAB — CBC WITH DIFFERENTIAL/PLATELET
BASOS ABS: 0 10*3/uL (ref 0.0–0.1)
BASOS PCT: 0 % (ref 0–1)
Eosinophils Absolute: 0 10*3/uL (ref 0.0–0.7)
Eosinophils Relative: 0 % (ref 0–5)
HEMATOCRIT: 38.8 % (ref 36.0–46.0)
HEMOGLOBIN: 12.3 g/dL (ref 12.0–15.0)
Lymphocytes Relative: 10 % — ABNORMAL LOW (ref 12–46)
Lymphs Abs: 1.4 10*3/uL (ref 0.7–4.0)
MCH: 29 pg (ref 26.0–34.0)
MCHC: 31.7 g/dL (ref 30.0–36.0)
MCV: 91.5 fL (ref 78.0–100.0)
MONO ABS: 1.1 10*3/uL — AB (ref 0.1–1.0)
MONOS PCT: 8 % (ref 3–12)
Neutro Abs: 11.3 10*3/uL — ABNORMAL HIGH (ref 1.7–7.7)
Neutrophils Relative %: 82 % — ABNORMAL HIGH (ref 43–77)
Platelets: 222 10*3/uL (ref 150–400)
RBC: 4.24 MIL/uL (ref 3.87–5.11)
RDW: 14.6 % (ref 11.5–15.5)
WBC: 13.8 10*3/uL — ABNORMAL HIGH (ref 4.0–10.5)

## 2014-10-28 LAB — PROTIME-INR
INR: 1.35 (ref 0.00–1.49)
Prothrombin Time: 16.8 seconds — ABNORMAL HIGH (ref 11.6–15.2)

## 2014-10-28 LAB — APTT: aPTT: 44 seconds — ABNORMAL HIGH (ref 24–37)

## 2014-10-28 SURGERY — ARTHROPLASTY, KNEE, TOTAL
Anesthesia: General | Laterality: Left

## 2014-10-28 MED ORDER — HYDROMORPHONE HCL 1 MG/ML IJ SOLN
INTRAMUSCULAR | Status: AC
  Filled 2014-10-28: qty 1

## 2014-10-28 MED ORDER — FENTANYL CITRATE 0.05 MG/ML IJ SOLN
INTRAMUSCULAR | Status: AC
  Filled 2014-10-28: qty 5

## 2014-10-28 MED ORDER — LIDOCAINE HCL (PF) 1 % IJ SOLN
INTRAMUSCULAR | Status: AC
  Filled 2014-10-28: qty 30

## 2014-10-28 MED ORDER — HYDROMORPHONE HCL 1 MG/ML IJ SOLN: 0.5000 mg | Freq: Once | INTRAMUSCULAR | Status: DC

## 2014-10-28 MED ORDER — LACTATED RINGERS IV SOLN
INTRAVENOUS | Status: DC
  Administered 2014-10-28: 14:00:00 via INTRAVENOUS

## 2014-10-28 MED ORDER — ONDANSETRON HCL 4 MG/2ML IJ SOLN
INTRAMUSCULAR | Status: AC
  Filled 2014-10-28: qty 2

## 2014-10-28 MED ORDER — LACTATED RINGERS IV BOLUS (SEPSIS): 1000.0000 mL | Freq: Once | INTRAVENOUS | Status: DC

## 2014-10-28 MED ORDER — LIDOCAINE HCL (CARDIAC) 20 MG/ML IV SOLN
INTRAVENOUS | Status: AC
  Filled 2014-10-28: qty 5

## 2014-10-28 MED ORDER — HYDROMORPHONE HCL 1 MG/ML IJ SOLN
0.5000 mg | Freq: Once | INTRAMUSCULAR | Status: AC
  Administered 2014-10-28: 0.5 mg via INTRAVENOUS

## 2014-10-28 MED ORDER — PROPOFOL 10 MG/ML IV BOLUS
INTRAVENOUS | Status: AC
  Filled 2014-10-28: qty 20

## 2014-10-28 SURGICAL SUPPLY — 55 items
BANDAGE ESMARK 6X9 LF (GAUZE/BANDAGES/DRESSINGS) ×1 IMPLANT
BENZOIN TINCTURE PRP APPL 2/3 (GAUZE/BANDAGES/DRESSINGS) ×3 IMPLANT
BLADE SAGITTAL 25.0X1.19X90 (BLADE) ×2 IMPLANT
BLADE SAGITTAL 25.0X1.19X90MM (BLADE) ×1
BLADE SAW SAG 90X13X1.27 (BLADE) ×3 IMPLANT
BNDG ESMARK 6X9 LF (GAUZE/BANDAGES/DRESSINGS) ×3
BOWL SMART MIX CTS (DISPOSABLE) ×3 IMPLANT
CLOSURE WOUND 1/2 X4 (GAUZE/BANDAGES/DRESSINGS) ×1
COVER SURGICAL LIGHT HANDLE (MISCELLANEOUS) ×3 IMPLANT
CUFF TOURNIQUET SINGLE 34IN LL (TOURNIQUET CUFF) ×3 IMPLANT
CUFF TOURNIQUET SINGLE 44IN (TOURNIQUET CUFF) IMPLANT
DRAPE EXTREMITY T 121X128X90 (DRAPE) ×3 IMPLANT
DRAPE IMP U-DRAPE 54X76 (DRAPES) ×3 IMPLANT
DRAPE U-SHAPE 47X51 STRL (DRAPES) ×3 IMPLANT
DURAPREP 26ML APPLICATOR (WOUND CARE) ×3 IMPLANT
ELECT REM PT RETURN 9FT ADLT (ELECTROSURGICAL) ×3
ELECTRODE REM PT RTRN 9FT ADLT (ELECTROSURGICAL) ×1 IMPLANT
EVACUATOR 1/8 PVC DRAIN (DRAIN) ×3 IMPLANT
FACESHIELD WRAPAROUND (MASK) ×3 IMPLANT
GAUZE SPONGE 4X4 12PLY STRL (GAUZE/BANDAGES/DRESSINGS) ×3 IMPLANT
GAUZE XEROFORM 5X9 LF (GAUZE/BANDAGES/DRESSINGS) ×3 IMPLANT
GLOVE BIOGEL PI IND STRL 8 (GLOVE) ×2 IMPLANT
GLOVE BIOGEL PI INDICATOR 8 (GLOVE) ×4
GLOVE ECLIPSE 7.5 STRL STRAW (GLOVE) ×6 IMPLANT
GOWN STRL REUS W/ TWL LRG LVL3 (GOWN DISPOSABLE) ×1 IMPLANT
GOWN STRL REUS W/ TWL XL LVL3 (GOWN DISPOSABLE) ×2 IMPLANT
GOWN STRL REUS W/TWL LRG LVL3 (GOWN DISPOSABLE) ×2
GOWN STRL REUS W/TWL XL LVL3 (GOWN DISPOSABLE) ×4
HANDPIECE INTERPULSE COAX TIP (DISPOSABLE) ×2
HOOD PEEL AWAY FACE SHEILD DIS (HOOD) ×9 IMPLANT
IMMOBILIZER KNEE 20 (SOFTGOODS) IMPLANT
IMMOBILIZER KNEE 22 UNIV (SOFTGOODS) ×3 IMPLANT
KIT BASIN OR (CUSTOM PROCEDURE TRAY) ×3 IMPLANT
KIT ROOM TURNOVER OR (KITS) ×3 IMPLANT
MANIFOLD NEPTUNE II (INSTRUMENTS) ×3 IMPLANT
NEEDLE SPNL 22GX3.5 QUINCKE BK (NEEDLE) ×3 IMPLANT
NS IRRIG 1000ML POUR BTL (IV SOLUTION) ×3 IMPLANT
PACK TOTAL JOINT (CUSTOM PROCEDURE TRAY) ×3 IMPLANT
PACK UNIVERSAL I (CUSTOM PROCEDURE TRAY) ×3 IMPLANT
PAD ARMBOARD 7.5X6 YLW CONV (MISCELLANEOUS) ×6 IMPLANT
PAD CAST 4YDX4 CTTN HI CHSV (CAST SUPPLIES) ×1 IMPLANT
PADDING CAST COTTON 4X4 STRL (CAST SUPPLIES) ×2
SET HNDPC FAN SPRY TIP SCT (DISPOSABLE) ×1 IMPLANT
STAPLER VISISTAT 35W (STAPLE) IMPLANT
STRIP CLOSURE SKIN 1/2X4 (GAUZE/BANDAGES/DRESSINGS) ×2 IMPLANT
SUCTION FRAZIER TIP 10 FR DISP (SUCTIONS) ×3 IMPLANT
SUT MNCRL AB 3-0 PS2 18 (SUTURE) IMPLANT
SUT VIC AB 0 CTB1 27 (SUTURE) ×6 IMPLANT
SUT VIC AB 1 CT1 27 (SUTURE) ×4
SUT VIC AB 1 CT1 27XBRD ANBCTR (SUTURE) ×2 IMPLANT
SUT VIC AB 2-0 CTB1 (SUTURE) ×6 IMPLANT
SYR 50ML LL SCALE MARK (SYRINGE) ×3 IMPLANT
TOWEL OR 17X24 6PK STRL BLUE (TOWEL DISPOSABLE) ×3 IMPLANT
TOWEL OR 17X26 10 PK STRL BLUE (TOWEL DISPOSABLE) ×3 IMPLANT
TRAY FOLEY CATH 16FRSI W/METER (SET/KITS/TRAYS/PACK) IMPLANT

## 2014-10-28 NOTE — Anesthesia Preprocedure Evaluation (Deleted)
Anesthesia Evaluation  Patient identified by MRN, date of birth, ID band Patient awake    Reviewed: Allergy & Precautions, NPO status , Patient's Chart, lab work & pertinent test results  Airway Mallampati: II  TM Distance: >3 FB Neck ROM: Full    Dental no notable dental hx.    Pulmonary asthma , former smoker,  breath sounds clear to auscultation  Pulmonary exam normal       Cardiovascular hypertension, Pt. on medications Rhythm:Regular Rate:Normal     Neuro/Psych negative neurological ROS  negative psych ROS   GI/Hepatic GERD-  ,(+) Hepatitis -  Endo/Other  negative endocrine ROS  Renal/GU negative Renal ROS     Musculoskeletal  (+) Arthritis -,   Abdominal   Peds  Hematology negative hematology ROS (+)   Anesthesia Other Findings   Reproductive/Obstetrics negative OB ROS                             Anesthesia Physical Anesthesia Plan  ASA: III  Anesthesia Plan:    Post-op Pain Management:    Induction: Intravenous  Airway Management Planned:   Additional Equipment:   Intra-op Plan:   Post-operative Plan: Extubation in OR  Informed Consent: I have reviewed the patients History and Physical, chart, labs and discussed the procedure including the risks, benefits and alternatives for the proposed anesthesia with the patient or authorized representative who has indicated his/her understanding and acceptance.   Dental advisory given  Plan Discussed with: CRNA  Anesthesia Plan Comments:         Anesthesia Quick Evaluation

## 2014-10-28 NOTE — Progress Notes (Signed)
Pt. Arrived in room 31, she reports ( with daughter's reinforcement in report) that the last 2 days she has not been feeling well; remarks, pain in neck & L arm.  Pt. Unclear why, states there  has been a lot "pulling" with her not feeling strong enough to sit upright, feeling weak & falling & not being able to walk.   Pt. Feeling pain in L knee, reports a score of 10. Dr. Luiz BlareGraves into see pt., temp. Noted to be ^, orders rec'd to do lab work, give IV bolus of fluid, do EKG.

## 2014-10-28 NOTE — Progress Notes (Signed)
Fayrene FearingJames, [place] has re-evaluted patient. Family and patient feels safe to go home. Fayrene FearingJames, [place] has given patient discharge instructions.

## 2014-10-28 NOTE — Progress Notes (Signed)
Attempted to ambulated patient per Marshia LyJames Bethune, PA patient unsteady on feet. Ambulated patient 2 steps and then returned patient to bed. Family reports that they do not feel safe taking patient home with current condition. Re-notified Fayrene FearingJames regarding same he will be in to re- evaluate.

## 2014-10-28 NOTE — Telephone Encounter (Signed)
Treated with sulfamethoxazole and trimethoprim by orthopedic surgery. Can you please call the main hospital lab to add a urine culture.

## 2014-10-28 NOTE — Progress Notes (Addendum)
Patient assisted to car with family. Per NT patient was able to get into with significantly less assistance than when she come into hospital. IV removed from Right wrist prior to d/c cath intact.

## 2014-10-28 NOTE — H&P (Addendum)
TOTAL KNEE ADMISSION H&P  Patient is being admitted for left total knee arthroplasty.  Subjective:  Chief Complaint:left knee pain.  HPI: Cindy Briggs, 81 y.o. female, has a history of pain and functional disability in the left knee due to arthritis and has failed non-surgical conservative treatments for greater than 12 weeks to includeNSAID's and/or analgesics, flexibility and strengthening excercises, use of assistive devices and activity modification.  Onset of symptoms was gradual, starting 5 years ago with gradually worsening course since that time. The patient noted no past surgery on the left knee(s).  Patient currently rates pain in the left knee(s) at 8 out of 10 with activity. Patient has night pain, worsening of pain with activity and weight bearing, pain that interferes with activities of daily living, pain with passive range of motion, crepitus and joint swelling.  Patient has evidence of subchondral cysts, subchondral sclerosis, periarticular osteophytes and joint space narrowing by imaging studies. This patient has had recent feeling poorly and shows up today with temp elevation and worsening knee paine. There is no active infection.  Patient Active Problem List   Diagnosis Date Noted  . Foot pain, bilateral 07/31/2014  . Skin tag 05/17/2014  . Seborrheic keratosis 05/10/2014  . Lumbar spondylosis 04/25/2014  . Osteoarthritis of left knee 04/25/2014  . Preventive measure 04/25/2014   Past Medical History  Diagnosis Date  . Hypertension   . Asthma     as a child  . GERD (gastroesophageal reflux disease)   . Arthritis   . Hepatitis     hepB 30 years ago    Past Surgical History  Procedure Laterality Date  . Joint replacement      right knee  . Eye surgery Bilateral   . Hammer toes      10-15 years ago both feet    Prescriptions prior to admission  Medication Sig Dispense Refill Last Dose  . acetaminophen (TYLENOL) 325 MG tablet Take 650 mg by mouth every 6 (six) hours  as needed (pain).   Past Week at Unknown time  . aspirin EC 81 MG tablet Take 81 mg by mouth daily.   Past Week at Unknown time  . chlorthalidone (HYGROTON) 25 MG tablet Take 25 mg by mouth daily.   Past Week at Unknown time  . DULoxetine (CYMBALTA) 60 MG capsule Take 60 mg by mouth daily.   10/27/2014 at Unknown time  . furosemide (LASIX) 20 MG tablet TAKE 1 TABLET BY MOUTH EVERY DAY 30 tablet 0 10/27/2014 at Unknown time  . Guaifenesin (MUCINEX MAXIMUM STRENGTH) 1200 MG TB12 Take 1 tablet (1,200 mg total) by mouth 2 (two) times daily. 60 each 0 10/27/2014 at Unknown time  . lansoprazole (PREVACID) 30 MG capsule Take 30 mg by mouth daily.    10/27/2014 at Unknown time  . lisinopril (PRINIVIL,ZESTRIL) 10 MG tablet TAKE 1 TABLET BY MOUTH EVERY DAY 30 tablet 0 10/27/2014 at Unknown time  . meloxicam (MOBIC) 15 MG tablet Take 15 mg by mouth daily.   Past Week at Unknown time  . potassium chloride (K-DUR) 10 MEQ tablet Take 10 mEq by mouth 2 (two) times daily.    10/28/2014 at Unknown time  . pravastatin (PRAVACHOL) 20 MG tablet TAKE 1 TABLET BY MOUTH EVERY DAY 30 tablet 0 10/27/2014 at Unknown time  . pregabalin (LYRICA) 300 MG capsule Take 1 capsule (300 mg total) by mouth 2 (two) times daily. 60 capsule 0 10/28/2014 at Unknown time  . traMADol (ULTRAM) 50 MG tablet TAKE  1 TABLET BY MOUTH EVERY 8 HOURS AS NEEDED FOR MODERATE PAIN 90 tablet 0 10/28/2014 at Unknown time  . furosemide (LASIX) 20 MG tablet TAKE 1 TABLET BY MOUTH DAILY (Patient not taking: Reported on 10/18/2014) 30 tablet 0 Not Taking at Unknown time  . HYDROcodone-acetaminophen (NORCO/VICODIN) 5-325 MG per tablet Take 1 tablet by mouth every 6 (six) hours as needed (pain).   0   . ibuprofen (ADVIL,MOTRIN) 200 MG tablet Take 800 mg by mouth every 6 (six) hours as needed (pain).     Marland Kitchen lisinopril (PRINIVIL,ZESTRIL) 10 MG tablet TAKE 1 TABLET BY MOUTH EVERY DAY (Patient not taking: Reported on 09/04/2014) 30 tablet 0 Not Taking at Unknown time  .  Multiple Vitamin (MULTIVITAMIN WITH MINERALS) TABS tablet Take 1 tablet by mouth daily.     Marland Kitchen trimethoprim (TRIMPEX) 100 MG tablet Take 100 mg by mouth 2 (two) times a week. Wednesday and Friday   Taking  . Vitamin D, Ergocalciferol, (DRISDOL) 50000 UNITS CAPS capsule Take 50,000 Units by mouth every 7 (seven) days. Saturdays   Taking   Allergies  Allergen Reactions  . Latex Dermatitis and Rash    History  Substance Use Topics  . Smoking status: Former Smoker -- 10 years    Types: Cigarettes    Quit date: 10/09/2014  . Smokeless tobacco: Not on file  . Alcohol Use: No    History reviewed. No pertinent family history.   ROS ROS: I have reviewed the patient's review of systems thoroughly and there are no positive responses as relates to the HPI. Objective:  Physical Exam  Vital signs in last 24 hours: Temp:  [100.1 F (37.8 C)] 100.1 F (37.8 C) (01/25 1215) Pulse Rate:  [100] 100 (01/25 1215) Resp:  [16] 16 (01/25 1215) BP: (171)/(90) 171/90 mmHg (01/25 1215) SpO2:  [100 %] 100 % (01/25 1215) Well-developed well-nourished patient in no acute distress. Alert and oriented x3 HEENT:within normal limits Cardiac: Regular rate and rhythm Pulmonary: Lungs clear to auscultation Abdomen: Soft and nontender.  Normal active bowel sounds  Musculoskeletal: (l knee no erythema or effusion.  Mod pain on rom.  No instability Labs: Recent Results (from the past 2160 hour(s))  Surgical pcr screen     Status: None   Collection Time: 10/18/14  2:28 PM  Result Value Ref Range   MRSA, PCR NEGATIVE NEGATIVE   Staphylococcus aureus NEGATIVE NEGATIVE    Comment:        The Xpert SA Assay (FDA approved for NASAL specimens in patients over 80 years of age), is one component of a comprehensive surveillance program.  Test performance has been validated by [facility] for patients greater than or equal to 48 year old. It is not intended to diagnose infection nor to guide or monitor  treatment.   APTT     Status: Abnormal   Collection Time: 10/18/14  2:28 PM  Result Value Ref Range   aPTT 42 (H) 24 - 37 seconds    Comment:        IF BASELINE aPTT IS ELEVATED, SUGGEST PATIENT RISK ASSESSMENT BE USED TO DETERMINE APPROPRIATE ANTICOAGULANT THERAPY.   CBC WITH DIFFERENTIAL     Status: None   Collection Time: 10/18/14  2:28 PM  Result Value Ref Range   WBC 8.4 4.0 - 10.5 K/uL   RBC 4.90 3.87 - 5.11 MIL/uL   Hemoglobin 14.6 12.0 - 15.0 g/dL   HCT 44.8 36.0 - 46.0 %   MCV 91.4 78.0 -  100.0 fL   MCH 29.8 26.0 - 34.0 pg   MCHC 32.6 30.0 - 36.0 g/dL   RDW 14.0 11.5 - 15.5 %   Platelets 296 150 - 400 K/uL   Neutrophils Relative % 60 43 - 77 %   Neutro Abs 5.1 1.7 - 7.7 K/uL   Lymphocytes Relative 28 12 - 46 %   Lymphs Abs 2.4 0.7 - 4.0 K/uL   Monocytes Relative 6 3 - 12 %   Monocytes Absolute 0.5 0.1 - 1.0 K/uL   Eosinophils Relative 5 0 - 5 %   Eosinophils Absolute 0.4 0.0 - 0.7 K/uL   Basophils Relative 1 0 - 1 %   Basophils Absolute 0.1 0.0 - 0.1 K/uL  Comprehensive metabolic panel     Status: Abnormal   Collection Time: 10/18/14  2:28 PM  Result Value Ref Range   Sodium 144 135 - 145 mmol/L    Comment: Please note change in reference range.   Potassium 3.8 3.5 - 5.1 mmol/L    Comment: Please note change in reference range.   Chloride 104 96 - 112 mEq/L   CO2 28 19 - 32 mmol/L   Glucose, Bld 101 (H) 70 - 99 mg/dL   BUN 21 6 - 23 mg/dL   Creatinine, Ser 0.89 0.50 - 1.10 mg/dL   Calcium 9.8 8.4 - 10.5 mg/dL   Total Protein 6.5 6.0 - 8.3 g/dL   Albumin 4.1 3.5 - 5.2 g/dL   AST 25 0 - 37 U/L   ALT 16 0 - 35 U/L   Alkaline Phosphatase 107 39 - 117 U/L   Total Bilirubin 0.5 0.3 - 1.2 mg/dL   GFR calc non Af Amer 59 (L) >90 mL/min   GFR calc Af Amer 69 (L) >90 mL/min    Comment: (NOTE) The eGFR has been calculated using the CKD EPI equation. This calculation has not been validated in all clinical situations. eGFR's persistently <90 mL/min signify  possible Chronic Kidney Disease.    Anion gap 12 5 - 15  Protime-INR     Status: None   Collection Time: 10/18/14  2:28 PM  Result Value Ref Range   Prothrombin Time 13.8 11.6 - 15.2 seconds   INR 1.05 0.00 - 1.49  Type and screen     Status: None   Collection Time: 10/18/14  2:28 PM  Result Value Ref Range   ABO/RH(D) O POS    Antibody Screen NEG    Sample Expiration 11/01/2014   Urinalysis, Routine w reflex microscopic     Status: Abnormal   Collection Time: 10/18/14  2:28 PM  Result Value Ref Range   Color, Urine YELLOW YELLOW   APPearance CLOUDY (A) CLEAR   Specific Gravity, Urine 1.019 1.005 - 1.030   pH 5.0 5.0 - 8.0   Glucose, UA NEGATIVE NEGATIVE mg/dL   Hgb urine dipstick NEGATIVE NEGATIVE   Bilirubin Urine NEGATIVE NEGATIVE   Ketones, ur NEGATIVE NEGATIVE mg/dL   Protein, ur NEGATIVE NEGATIVE mg/dL   Urobilinogen, UA 0.2 0.0 - 1.0 mg/dL   Nitrite NEGATIVE NEGATIVE   Leukocytes, UA NEGATIVE NEGATIVE    Comment: MICROSCOPIC NOT DONE ON URINES WITH NEGATIVE PROTEIN, BLOOD, LEUKOCYTES, NITRITE, OR GLUCOSE <1000 mg/dL.  ABO/Rh     Status: None   Collection Time: 10/18/14  2:28 PM  Result Value Ref Range   ABO/RH(D) O POS   I-STAT 4, (NA,K, GLUC, HGB,HCT)     Status: Abnormal   Collection Time: 10/28/14 12:50  PM  Result Value Ref Range   Sodium 138 135 - 145 mmol/L   Potassium 3.9 3.5 - 5.1 mmol/L   Glucose, Bld 103 (H) 70 - 99 mg/dL   HCT 37.0 36.0 - 46.0 %   Hemoglobin 12.6 12.0 - 15.0 g/dL    Estimated body mass index is 26.09 kg/(m^2) as calculated from the following:   Height as of this encounter: '5\' 6"'  (1.676 m).   Weight as of 10/18/14: 161 lb 9.6 oz (73.3 kg).   Imaging Review Plain radiographs demonstrate severe degenerative joint disease of the left knee(s). The overall alignment issignificant valgus. The bone quality appears to be fair for age and reported activity level.  Assessment/Plan:  End stage arthritis, left knee   The patient history,  physical examination, clinical judgment of the provider and imaging studies are consistent with end stage degenerative joint disease of the left knee(s) and total knee arthroplasty is deemed medically necessary. The treatment options including medical management, injection therapy arthroscopy and arthroplasty were discussed at length. The risks and benefits of total knee arthroplasty were presented and reviewed. The risks due to aseptic loosening, infection, stiffness, patella tracking problems, thromboembolic complications and other imponderables were discussed. The patient acknowledged the explanation, agreed to proceed with the plan and consent was signed. Patient is being admitted for inpatient treatment for surgery, pain control, PT, OT, prophylactic antibiotics, VTE prophylaxis, progressive ambulation and ADL's and discharge planning. The patient is planning to be discharged home with home health services.  Pt currently has elevated temp and sudden worsening pain l knee and difficulty walking.  Will await results of CBC and if WBC elevated will need to cancel and reschedule.    Addendum:  Pt has repeat EKG which is unchanged.  WBC is elevated to >13k.  Will cancel surgery and aspirate knee to ensure no evidence of infection.  Will treat with pain meds to see if she is able to walk better and be discharged home and if not she may need admission and further work up.

## 2014-10-28 NOTE — Progress Notes (Signed)
All review of medicines with pt. & daughter reveals that pt. doesn't have all her  Meds., she has not returned to PCP per schedule for  the refills.

## 2014-10-28 NOTE — Progress Notes (Signed)
Give additional 0.5 mg dilaudid IV per order

## 2014-10-30 ENCOUNTER — Other Ambulatory Visit: Payer: Self-pay

## 2014-10-30 ENCOUNTER — Other Ambulatory Visit: Payer: Self-pay | Admitting: Sports Medicine

## 2014-10-30 MED ORDER — DULOXETINE HCL 60 MG PO CPEP: 60.0000 mg | ORAL_CAPSULE | Freq: Every day | ORAL | Status: DC

## 2014-10-30 MED ORDER — LISINOPRIL 10 MG PO TABS: 10.0000 mg | ORAL_TABLET | Freq: Every day | ORAL | Status: DC

## 2014-10-30 NOTE — Telephone Encounter (Signed)
Patient request refill for Lisinopril and Cymbalta 60 mg.  [facility] Spoke to patient and she was aware that appt is needed for further refills, she was transferred to front office to schedule appt. Rhonda Cunningham,CMA

## 2014-11-01 LAB — BODY FLUID CULTURE
Culture: NO GROWTH
SPECIAL REQUESTS: NORMAL

## 2014-11-03 LAB — ANAEROBIC CULTURE

## 2014-11-04 ENCOUNTER — Ambulatory Visit: Payer: Medicare Other | Admitting: Sports Medicine

## 2014-11-08 ENCOUNTER — Ambulatory Visit: Payer: Medicare Other | Admitting: Sports Medicine

## 2014-11-12 ENCOUNTER — Ambulatory Visit (INDEPENDENT_AMBULATORY_CARE_PROVIDER_SITE_OTHER): Payer: Medicare Other | Admitting: Sports Medicine

## 2014-11-12 ENCOUNTER — Encounter: Payer: Self-pay | Admitting: Sports Medicine

## 2014-11-12 VITALS — BP 148/91 | HR 92 | Wt 170.0 lb

## 2014-11-12 DIAGNOSIS — M1712 Unilateral primary osteoarthritis, left knee: Secondary | ICD-10-CM

## 2014-11-12 DIAGNOSIS — R600 Localized edema: Secondary | ICD-10-CM | POA: Insufficient documentation

## 2014-11-12 DIAGNOSIS — R911 Solitary pulmonary nodule: Secondary | ICD-10-CM

## 2014-11-12 DIAGNOSIS — N390 Urinary tract infection, site not specified: Secondary | ICD-10-CM | POA: Diagnosis not present

## 2014-11-12 MED ORDER — AMBULATORY NON FORMULARY MEDICATION: Status: DC

## 2014-11-12 MED ORDER — TRIMETHOPRIM 100 MG PO TABS: 100.0000 mg | ORAL_TABLET | ORAL | Status: DC

## 2014-11-12 MED ORDER — FUROSEMIDE 40 MG PO TABS: 40.0000 mg | ORAL_TABLET | Freq: Every day | ORAL | Status: DC

## 2014-11-12 MED ORDER — POTASSIUM CHLORIDE ER 10 MEQ PO TBCR: 10.0000 meq | EXTENDED_RELEASE_TABLET | Freq: Two times a day (BID) | ORAL | Status: DC

## 2014-11-12 NOTE — Assessment & Plan Note (Signed)
We will obtain a repeat CT in 3 months for further evaluation of the pulmonary nodule

## 2014-11-12 NOTE — Assessment & Plan Note (Signed)
Continue chlorthalidone, increasing furosemide, refilling potassium. Also need to restart lower extremity compression stockings

## 2014-11-12 NOTE — Progress Notes (Signed)
  Subjective:    CC: follow-up  HPI: Solitary pulmonary nodule: We will need a repeat CT scan in 3 months.  Left knee osteoarthritis: Unfortunately had to cancel her surgery due to a urinary tract infection, initially she was on trimethoprim twice a week but has run out of this, she currently is being treated with Bactrim, and symptoms have improved significantly. She is eager to proceed with her surgery.  Lower extremity edema: On chlorthalidone, she continues to take furosemide 20 mg daily as well as potassium, she has however discontinued her lower extremity compression stockings.  Past medical history, Surgical history, Family history not pertinant except as noted below, Social history, Allergies, and medications have been entered into the medical record, reviewed, and no changes needed.   Review of Systems: No fevers, chills, night sweats, weight loss, chest pain, or shortness of breath.   Objective:    General: Well Developed, well nourished, and in no acute distress.  Neuro: Alert and oriented x3, extra-ocular muscles intact, sensation grossly intact.  HEENT: Normocephalic, atraumatic, pupils equal round reactive to light, neck supple, no masses, no lymphadenopathy, thyroid nonpalpable.  Skin: Warm and dry, no rashes. Cardiac: Regular rate and rhythm, no murmurs rubs or gallops, 3+ symmetric lower extremity edema.  Respiratory: Clear to auscultation bilaterally. Not using accessory muscles, speaking in full sentences.  Impression and Recommendations:

## 2014-11-12 NOTE — Assessment & Plan Note (Signed)
Unfortunately had to cancel her surgery due to urinary tract infection, she is treated and everything has resolved. She is again cleared for total knee arthroplasty.

## 2014-11-12 NOTE — Assessment & Plan Note (Signed)
Restarting 2/week trimethoprim.

## 2014-11-14 ENCOUNTER — Other Ambulatory Visit: Payer: Self-pay | Admitting: *Deleted

## 2014-11-14 MED ORDER — CHLORTHALIDONE 25 MG PO TABS: 25.0000 mg | ORAL_TABLET | Freq: Every day | ORAL | Status: DC

## 2014-11-25 ENCOUNTER — Other Ambulatory Visit: Payer: Self-pay | Admitting: Sports Medicine

## 2014-12-05 ENCOUNTER — Other Ambulatory Visit: Payer: Self-pay | Admitting: Orthopedic Surgery

## 2014-12-09 ENCOUNTER — Encounter (HOSPITAL_COMMUNITY): Payer: Self-pay

## 2014-12-09 ENCOUNTER — Ambulatory Visit (HOSPITAL_COMMUNITY)
Admission: RE | Admit: 2014-12-09 | Discharge: 2014-12-09 | Disposition: A | Discharge status: Home / Self Care | Payer: Medicare Other | Source: Ambulatory Visit | Attending: Orthopedic Surgery | Admitting: Orthopedic Surgery

## 2014-12-09 ENCOUNTER — Encounter (HOSPITAL_COMMUNITY)
Admission: RE | Admit: 2014-12-09 | Discharge: 2014-12-09 | Disposition: A | Discharge status: Home / Self Care | Payer: Medicare Other | Source: Ambulatory Visit | Attending: Orthopedic Surgery | Admitting: Orthopedic Surgery

## 2014-12-09 ENCOUNTER — Encounter (HOSPITAL_COMMUNITY): Payer: Self-pay | Admitting: Vascular Surgery

## 2014-12-09 DIAGNOSIS — Z01818 Encounter for other preprocedural examination: Secondary | ICD-10-CM

## 2014-12-09 DIAGNOSIS — Z01812 Encounter for preprocedural laboratory examination: Secondary | ICD-10-CM | POA: Diagnosis not present

## 2014-12-09 DIAGNOSIS — R05 Cough: Secondary | ICD-10-CM | POA: Diagnosis not present

## 2014-12-09 DIAGNOSIS — M179 Osteoarthritis of knee, unspecified: Secondary | ICD-10-CM | POA: Insufficient documentation

## 2014-12-09 DIAGNOSIS — M1712 Unilateral primary osteoarthritis, left knee: Secondary | ICD-10-CM | POA: Diagnosis not present

## 2014-12-09 HISTORY — DX: Pneumonia, unspecified organism: J18.9

## 2014-12-09 HISTORY — DX: Cardiac murmur, unspecified: R01.1

## 2014-12-09 HISTORY — DX: Urinary tract infection, site not specified: N39.0

## 2014-12-09 HISTORY — DX: Unspecified asthma, uncomplicated: J45.909

## 2014-12-09 LAB — COMPREHENSIVE METABOLIC PANEL
ALK PHOS: 126 U/L — AB (ref 39–117)
ALT: 17 U/L (ref 0–35)
ANION GAP: 9 (ref 5–15)
AST: 30 U/L (ref 0–37)
Albumin: 4.2 g/dL (ref 3.5–5.2)
BILIRUBIN TOTAL: 0.9 mg/dL (ref 0.3–1.2)
BUN: 32 mg/dL — ABNORMAL HIGH (ref 6–23)
CO2: 30 mmol/L (ref 19–32)
Calcium: 9.5 mg/dL (ref 8.4–10.5)
Chloride: 100 mmol/L (ref 96–112)
Creatinine, Ser: 1.34 mg/dL — ABNORMAL HIGH (ref 0.50–1.10)
GFR calc non Af Amer: 36 mL/min — ABNORMAL LOW (ref >90)
GFR, EST AFRICAN AMERICAN: 42 mL/min — AB (ref >90)
GLUCOSE: 101 mg/dL — AB (ref 70–99)
POTASSIUM: 3.8 mmol/L (ref 3.5–5.1)
Sodium: 139 mmol/L (ref 135–145)
TOTAL PROTEIN: 7.4 g/dL (ref 6.0–8.3)

## 2014-12-09 LAB — CBC WITH DIFFERENTIAL/PLATELET
BASOS ABS: 0 10*3/uL (ref 0.0–0.1)
Basophils Relative: 0 % (ref 0–1)
EOS PCT: 8 % — AB (ref 0–5)
Eosinophils Absolute: 1 10*3/uL — ABNORMAL HIGH (ref 0.0–0.7)
HCT: 42.7 % (ref 36.0–46.0)
Hemoglobin: 13.2 g/dL (ref 12.0–15.0)
LYMPHS ABS: 3.9 10*3/uL (ref 0.7–4.0)
Lymphocytes Relative: 33 % (ref 12–46)
MCH: 28.7 pg (ref 26.0–34.0)
MCHC: 30.9 g/dL (ref 30.0–36.0)
MCV: 92.8 fL (ref 78.0–100.0)
Monocytes Absolute: 0.8 10*3/uL (ref 0.1–1.0)
Monocytes Relative: 7 % (ref 3–12)
NEUTROS PCT: 52 % (ref 43–77)
Neutro Abs: 6.4 10*3/uL (ref 1.7–7.7)
PLATELETS: 284 10*3/uL (ref 150–400)
RBC: 4.6 MIL/uL (ref 3.87–5.11)
RDW: 15.6 % — AB (ref 11.5–15.5)
WBC: 12.1 10*3/uL — ABNORMAL HIGH (ref 4.0–10.5)

## 2014-12-09 LAB — URINE MICROSCOPIC-ADD ON

## 2014-12-09 LAB — URINALYSIS, ROUTINE W REFLEX MICROSCOPIC
Bilirubin Urine: NEGATIVE
Glucose, UA: NEGATIVE mg/dL
HGB URINE DIPSTICK: NEGATIVE
Ketones, ur: NEGATIVE mg/dL
Nitrite: NEGATIVE
PH: 5.5 (ref 5.0–8.0)
Protein, ur: NEGATIVE mg/dL
Specific Gravity, Urine: 1.025 (ref 1.005–1.030)
Urobilinogen, UA: 0.2 mg/dL (ref 0.0–1.0)

## 2014-12-09 LAB — SURGICAL PCR SCREEN
MRSA, PCR: NEGATIVE
Staphylococcus aureus: NEGATIVE

## 2014-12-09 LAB — PROTIME-INR
INR: 1.06 (ref 0.00–1.49)
Prothrombin Time: 13.9 seconds (ref 11.6–15.2)

## 2014-12-09 LAB — TYPE AND SCREEN
ABO/RH(D): O POS
Antibody Screen: NEGATIVE

## 2014-12-09 LAB — APTT: aPTT: 49 seconds — ABNORMAL HIGH (ref 24–37)

## 2014-12-09 NOTE — Pre-Procedure Instructions (Addendum)
Cindy Briggs  12/09/2014   Your procedure is scheduled on:  12/20/14  [facility] short [facility] at 530 AM.  Call this number if you have problems the morning of surgery: [phone]   Remember:   Do not eat food or drink liquids after midnight.   Take these medicines the morning of surgery with A SIP OF WATER: cymbalta, pain med if needed,prevacid,lyrica     STOP all herbel meds, nsaids (aleve,naproxen,advil,ibuprofen) 5 days prior to surgery starting sun 12/15/14 including vitamins,aspirin, meloxicam    Do not wear jewelry, make-up or nail polish.  Do not wear lotions, powders, or perfumes. You may wear deodorant.  Do not shave 48 hours prior to surgery. Men may shave face and neck.  Do not bring valuables to the hospital.  [facility] is not responsible                  for any belongings or valuables.               Contacts, dentures or bridgework may not be worn into surgery.  Leave suitcase in the car. After surgery it may be brought to your room.  For patients admitted to the hospital, discharge time is determined by your                treatment team.               Patients discharged the day of surgery will not be allowed to drive  home.  Name and phone number of your driver:   Special Instructions:  Special Instructions: [place] - Preparing for Surgery  Before surgery, you can play an important role.  Because skin is not sterile, your skin needs to be as free of germs as possible.  You can reduce the number of germs on you skin by washing with CHG (chlorahexidine gluconate) soap before surgery.  CHG is an antiseptic cleaner which kills germs and bonds with the skin to continue killing germs even after washing.  Please DO NOT use if you have an allergy to CHG or antibacterial soaps.  If your skin becomes reddened/irritated stop using the CHG and inform your nurse when you arrive at Short Stay.  Do not shave (including legs and underarms) for at least 48 hours  prior to the first CHG shower.  You may shave your face.  Please follow these instructions carefully:   1.  Shower with CHG Soap the night before surgery and the morning of Surgery.  2.  If you choose to wash your hair, wash your hair first as usual with your normal shampoo.  3.  After you shampoo, rinse your hair and body thoroughly to remove the Shampoo.  4.  Use CHG as you would any other liquid soap.  You can apply chg directly  to the skin and wash gently with scrungie or a clean washcloth.  5.  Apply the CHG Soap to your body ONLY FROM THE NECK DOWN.  Do not use on open wounds or open sores.  Avoid contact with your eyes ears, mouth and genitals (private parts).  Wash genitals (private parts)       with your normal soap.  6.  Wash thoroughly, paying special attention to the area where your surgery will be performed.  7.  Thoroughly rinse your body with warm water from the neck down.  8.  DO NOT shower/wash with your normal soap after using and rinsing off the  CHG Soap.  9.  Pat yourself dry with a clean towel.            10.  Wear clean pajamas.            11.  Place clean sheets on your bed the night of your first shower and do not sleep with pets.  Day of Surgery  Do not apply any lotions/deodorants the morning of surgery.  Please wear clean clothes to the hospital/surgery center.   Please read over the following fact sheets that you were given: Pain Booklet, Coughing and Deep Breathing, Blood Transfusion Information, Total Joint Packet, MRSA Information and Surgical Site Infection Prevention

## 2014-12-10 ENCOUNTER — Ambulatory Visit (INDEPENDENT_AMBULATORY_CARE_PROVIDER_SITE_OTHER): Payer: Medicare Other | Admitting: Sports Medicine

## 2014-12-10 ENCOUNTER — Encounter: Payer: Self-pay | Admitting: Sports Medicine

## 2014-12-10 VITALS — BP 120/76 | HR 96 | Ht 64.0 in | Wt 164.0 lb

## 2014-12-10 DIAGNOSIS — J449 Chronic obstructive pulmonary disease, unspecified: Secondary | ICD-10-CM | POA: Insufficient documentation

## 2014-12-10 DIAGNOSIS — J410 Simple chronic bronchitis: Secondary | ICD-10-CM

## 2014-12-10 DIAGNOSIS — J438 Other emphysema: Secondary | ICD-10-CM | POA: Diagnosis not present

## 2014-12-10 MED ORDER — DOXYCYCLINE HYCLATE 100 MG PO TABS: 100.0000 mg | ORAL_TABLET | Freq: Two times a day (BID) | ORAL | Status: AC

## 2014-12-10 MED ORDER — PREDNISONE 50 MG PO TABS: 50.0000 mg | ORAL_TABLET | Freq: Every day | ORAL | Status: DC

## 2014-12-10 MED ORDER — VARENICLINE TARTRATE 0.5 MG X 11 & 1 MG X 42 PO MISC: ORAL | Status: DC

## 2014-12-10 MED ORDER — IPRATROPIUM-ALBUTEROL 20-100 MCG/ACT IN AERS
1.0000 | INHALATION_SPRAY | Freq: Four times a day (QID) | RESPIRATORY_TRACT | Status: DC | PRN
Start: 2014-12-10 — End: 2018-10-09

## 2014-12-10 NOTE — Progress Notes (Signed)
  Subjective:    CC:  cough  HPI:  Patient presents with complaint of 5 days of cough productive of yellow sputum. She denies any sore throat, fever, chills, nausea, vomiting, changes in bowel or bladder habits. She denies any known sick contacts. She reports that she had asthma as a child, and was once told that she has COPD. She admits to recently smoking cigarettes. She is worried that her symptoms will prevent her from having her knee replacement in 10 days.  Past medical history, Surgical history, Family history not pertinant except as noted below, Social history, Allergies, and medications have been entered into the medical record, reviewed, and no changes needed.   Review of Systems: No fevers, chills, night sweats, weight loss, chest pain, or shortness of breath.   Objective:    General: Well Developed, well nourished, and in no acute distress.  Neuro: Alert and oriented x3, extra-ocular muscles intact, sensation grossly intact.  HEENT: Normocephalic, atraumatic, pupils equal round reactive to light, neck supple, no masses, no lymphadenopathy, thyroid nonpalpable.  Skin: Warm and dry, no rashes. Cardiac: Regular rate and rhythm, no murmurs rubs or gallops, 1+ pitting lower extremity edema.  Respiratory: Expiratory wheezes and coarse rhonchi throughout. Not using accessory muscles, speaking in full sentences.   Impression and Recommendations:    # COPD exacerbation - Likely due to her recent resumption of cigarette smoking. - Begin Prednisone 50 mg burst for 5 days - Begin Doxycycline 100 mg BID for 7 days - Begin Combivent inhaler Q6H prn - Patient counseled regarding abstinence from cigarettes, begin Chantix to assist with quitting smoking.  Follow up in 2 weeks or sooner as needed

## 2014-12-10 NOTE — Assessment & Plan Note (Signed)
Currently in acute exacerbation. Prednisone, doxycycline, inhaled Combivent. Chantix, it does sound as though she had a cigarette leading to her exacerbation. She does have a knee replacement coming up in 10 days, she needs to be asymptomatic or they may cancel the surgery.

## 2014-12-10 NOTE — Progress Notes (Signed)
Anesthesia Chart Review:  Patient is a 81 year old female scheduled for left TKR on 12/20/14 by Dr. Luiz BlareGraves.  Procedure was initially scheduled for 10/25/14, but was postponed due to UTI.    History includes recent former smoker (quit 10/09/14), HTN, asthma, GERD, arthritis, hepatitis B, right TKA. PCP is Dr. Deitra Mayohekkedandem [facility] who cleared patient for this procedure. However, he saw patient again this afternoon for an acute COPD exacerbation and prescribed prednisone, doxycycline (X 7 days), and inhaled Combivent.  He told her that she should be asymptomatic by her surgery in 10 days, or her surgery may be canceled. He also started her on Chantix.   Other meds include ASA 81mg , chlorthalidone, Cymbalta, Lasix, Norco, lansoprazole, lisinopril, Kdur, Pravastatin, Lyrica.   10/18/14 EKG: NSR, minimal voltage for LVH, may be a normal variant. Anterior infarct (age undetermined). Currently, there is no comparison EKGs available.   12/09/14 CXR (done at PAT due to reports of cough): No active disease.  10/24/14 Chest CT w/o contrast: 2 cm reticular and ground-glass opacity seen laterally in superior segment of right lower lobe. Initial follow-up by chest CT without contrast is recommended in 3 months to confirm persistence. Dr. Deitra Mayohekkedandem is planning to repeat a chest CT in three months to further evaluate her pulmonary nodule.  Preoperative labs noted. WBC 12.1. PLT count 284K. Cr 1.34, up from 0.8.Marland Kitchen. PT/INR WNL. PTT elevated at 49, was 44 last month. AST/ALT WNL. Earlier today I notified Darl PikesSusan at Dr. Luiz BlareGraves office of elevated PTT and that patient was seeing her PCP today for respiratory symptoms. Since patient is not on anti-coagulation therapy and PTT has consistently been in the 40 range X 3 since 10/2014, I will not plan to repeat unless ordered by Dr. Luiz BlareGraves. Consider checking an ISTAT8 on the day of surgery to re-evaluate her renal function.   Plans to proceed as  scheduled will depend on if her COPD exacerbation symptoms have resolved.  She has already been told that she should be better for surgery.  Further evaluation by her assigned anesthesiologist on the day of surgery.  Velna Ochsllison Frederic Tones, PA-C [facility]/Anesthesiology Phone [phone] 12/10/2014 4:21 PM

## 2014-12-17 ENCOUNTER — Other Ambulatory Visit: Payer: Self-pay | Admitting: Orthopedic Surgery

## 2014-12-20 ENCOUNTER — Encounter: Payer: Self-pay | Admitting: Sports Medicine

## 2014-12-20 ENCOUNTER — Ambulatory Visit (INDEPENDENT_AMBULATORY_CARE_PROVIDER_SITE_OTHER): Payer: Medicare Other | Admitting: Sports Medicine

## 2014-12-20 ENCOUNTER — Encounter (HOSPITAL_COMMUNITY): Admission: RE | Payer: Self-pay | Source: Ambulatory Visit

## 2014-12-20 ENCOUNTER — Inpatient Hospital Stay (HOSPITAL_COMMUNITY): Admission: RE | Admit: 2014-12-20 | Payer: Medicare Other | Source: Ambulatory Visit | Admitting: Orthopedic Surgery

## 2014-12-20 VITALS — BP 115/64 | HR 89 | Wt 164.0 lb

## 2014-12-20 DIAGNOSIS — J410 Simple chronic bronchitis: Secondary | ICD-10-CM

## 2014-12-20 DIAGNOSIS — J438 Other emphysema: Secondary | ICD-10-CM

## 2014-12-20 SURGERY — ARTHROPLASTY, KNEE, TOTAL
Anesthesia: General | Site: Knee | Laterality: Left

## 2014-12-20 MED ORDER — BUDESONIDE-FORMOTEROL FUMARATE 160-4.5 MCG/ACT IN AERO: 1.0000 | INHALATION_SPRAY | Freq: Two times a day (BID) | RESPIRATORY_TRACT | Status: DC

## 2014-12-20 NOTE — Assessment & Plan Note (Signed)
Exacerbation has resolved. There is still some shortness of breath and a wet cough. Adding Symbicort. Return in 3 weeks. Unfortunately arthroplasty was rescheduled again. We will wait until her symptoms are completely gone before rescheduling joint replacement. She also had some nausea and vomiting with the increased dose of Chantix, we will drop her down to 1 pill daily.

## 2014-12-20 NOTE — Progress Notes (Signed)
  Subjective:    CC: Follow-up  HPI: COPD: Exacerbation has resolved however her knee replacement was canceled again due to persistent cough. She has no shortness of breath at this time. Symptoms are mild, improving. Also giving some nausea from twice a day Chantix.  Past medical history, Surgical history, Family history not pertinant except as noted below, Social history, Allergies, and medications have been entered into the medical record, reviewed, and no changes needed.   Review of Systems: No fevers, chills, night sweats, weight loss, chest pain, or shortness of breath.   Objective:    General: Well Developed, well nourished, and in no acute distress.  Neuro: Alert and oriented x3, extra-ocular muscles intact, sensation grossly intact.  HEENT: Normocephalic, atraumatic, pupils equal round reactive to light, neck supple, no masses, no lymphadenopathy, thyroid nonpalpable.  Skin: Warm and dry, no rashes. Cardiac: Regular rate and rhythm, no murmurs rubs or gallops, no lower extremity edema.  Respiratory: Coarse sounds bilaterally without crackles or wheezes.. Not using accessory muscles, speaking in full sentences.  Impression and Recommendations:

## 2014-12-24 ENCOUNTER — Other Ambulatory Visit: Payer: Self-pay | Admitting: Sports Medicine

## 2014-12-24 ENCOUNTER — Ambulatory Visit: Payer: Medicare Other | Admitting: Sports Medicine

## 2014-12-29 ENCOUNTER — Other Ambulatory Visit: Payer: Self-pay | Admitting: Sports Medicine

## 2015-01-10 ENCOUNTER — Encounter: Payer: Self-pay | Admitting: Sports Medicine

## 2015-01-10 ENCOUNTER — Ambulatory Visit (INDEPENDENT_AMBULATORY_CARE_PROVIDER_SITE_OTHER): Payer: Medicare Other | Admitting: Sports Medicine

## 2015-01-10 VITALS — BP 100/64 | HR 108 | Ht 64.0 in | Wt 171.0 lb

## 2015-01-10 DIAGNOSIS — N390 Urinary tract infection, site not specified: Secondary | ICD-10-CM

## 2015-01-10 DIAGNOSIS — M4716 Other spondylosis with myelopathy, lumbar region: Secondary | ICD-10-CM | POA: Diagnosis not present

## 2015-01-10 DIAGNOSIS — J410 Simple chronic bronchitis: Secondary | ICD-10-CM | POA: Diagnosis not present

## 2015-01-10 DIAGNOSIS — M1712 Unilateral primary osteoarthritis, left knee: Secondary | ICD-10-CM

## 2015-01-10 MED ORDER — BUDESONIDE-FORMOTEROL FUMARATE 160-4.5 MCG/ACT IN AERO: 1.0000 | INHALATION_SPRAY | Freq: Two times a day (BID) | RESPIRATORY_TRACT | Status: DC

## 2015-01-10 MED ORDER — HYDROCODONE-ACETAMINOPHEN 5-325 MG PO TABS: 1.0000 | ORAL_TABLET | Freq: Four times a day (QID) | ORAL | Status: DC | PRN

## 2015-01-10 MED ORDER — TRIMETHOPRIM 100 MG PO TABS: 100.0000 mg | ORAL_TABLET | ORAL | Status: DC

## 2015-01-10 NOTE — Progress Notes (Signed)
  Subjective:    CC: Follow-up  HPI: Recurrent UTIs: Needs a refill on antibiotics, she uses it 3 times per day.  COPD: Unfortunately has only been using her Symbicort occasionally, for some reason she didn't understand that she needed to take it every day twice a day. Overall symptoms have improved.  Knee osteoarthritis: I have asked her to go ahead and reschedule her knee replacement.  Lumbar spondylosis: Did well with resolution of radicular pain after an epidural, we did plan for several facet injections to try and reduce her axial pain however she has not yet followed through with this.  Past medical history, Surgical history, Family history not pertinant except as noted below, Social history, Allergies, and medications have been entered into the medical record, reviewed, and no changes needed.   Review of Systems: No fevers, chills, night sweats, weight loss, chest pain, or shortness of breath.   Objective:    General: Well Developed, well nourished, and in no acute distress.  Neuro: Alert and oriented x3, extra-ocular muscles intact, sensation grossly intact.  HEENT: Normocephalic, atraumatic, pupils equal round reactive to light, neck supple, no masses, no lymphadenopathy, thyroid nonpalpable.  Skin: Warm and dry, no rashes. Cardiac: Regular rate and rhythm, no murmurs rubs or gallops, no lower extremity edema.  Respiratory: Somewhat coarse but overall good air movement without wheezes or rhonchi. Not using accessory muscles, speaking in full sentences.  Impression and Recommendations:

## 2015-01-10 NOTE — Assessment & Plan Note (Signed)
Unfortunately patient has not yet proceeded with her facet injections. She did have a good response to a left-sided L5-S1 interlaminar epidural with resolution of radicular pain. Return to see me 3 weeks after facet injections.

## 2015-01-10 NOTE — Assessment & Plan Note (Signed)
Refilling antibiotics, she uses these 3 times per week.

## 2015-01-10 NOTE — Assessment & Plan Note (Signed)
Needs to go ahead and reschedule her knee replacement.

## 2015-01-10 NOTE — Assessment & Plan Note (Signed)
Unfortunately has not been using her Symbicort as directed, she will start using it twice a day every day.

## 2015-01-16 ENCOUNTER — Telehealth: Payer: Self-pay | Admitting: *Deleted

## 2015-01-16 MED ORDER — DIPHENOXYLATE-ATROPINE 2.5-0.025 MG PO TABS: ORAL_TABLET | ORAL | Status: DC

## 2015-01-16 NOTE — Telephone Encounter (Signed)
Pt left vm stating that she has had diarrhea x2 weeks and wanted to know if there was something you can send in for her.

## 2015-01-16 NOTE — Telephone Encounter (Signed)
LMOM notifying pt of rx & to make an appt if needed.

## 2015-01-16 NOTE — Telephone Encounter (Signed)
She has been on several courses of antibiotics, and diarrhea has been present for 2 weeks, we need to get some stool cultures. I am going to print a prescription for Lomotil, this will be in my box, she should also consider seeing one of my partners over the next several days for further evaluation of her diarrhea, considering antibiotics and duration she will probably need cultures/C. difficile toxin testing.

## 2015-01-20 ENCOUNTER — Other Ambulatory Visit: Payer: Self-pay | Admitting: *Deleted

## 2015-01-20 ENCOUNTER — Other Ambulatory Visit: Payer: Self-pay | Admitting: Sports Medicine

## 2015-01-20 ENCOUNTER — Ambulatory Visit
Admission: RE | Admit: 2015-01-20 | Discharge: 2015-01-20 | Disposition: A | Discharge status: Home / Self Care | Payer: Medicare Other | Source: Ambulatory Visit | Attending: Sports Medicine | Admitting: Sports Medicine

## 2015-01-20 VITALS — BP 136/67 | HR 84

## 2015-01-20 DIAGNOSIS — M47896 Other spondylosis, lumbar region: Secondary | ICD-10-CM

## 2015-01-20 DIAGNOSIS — M4716 Other spondylosis with myelopathy, lumbar region: Secondary | ICD-10-CM

## 2015-01-20 DIAGNOSIS — M47817 Spondylosis without myelopathy or radiculopathy, lumbosacral region: Secondary | ICD-10-CM | POA: Diagnosis not present

## 2015-01-20 DIAGNOSIS — M545 Low back pain, unspecified: Secondary | ICD-10-CM

## 2015-01-20 MED ORDER — IOHEXOL 180 MG/ML  SOLN
1.0000 mL | Freq: Once | INTRAMUSCULAR | Status: AC | PRN
  Administered 2015-01-20: 1 mL via INTRA_ARTICULAR

## 2015-01-20 MED ORDER — METHYLPREDNISOLONE ACETATE 40 MG/ML INJ SUSP (RADIOLOG
120.0000 mg | Freq: Once | INTRAMUSCULAR | Status: AC
  Administered 2015-01-20: 120 mg via INTRA_ARTICULAR

## 2015-01-20 MED ORDER — PREGABALIN 300 MG PO CAPS: 300.0000 mg | ORAL_CAPSULE | Freq: Two times a day (BID) | ORAL | Status: DC

## 2015-01-20 NOTE — Discharge Instructions (Signed)

## 2015-01-21 ENCOUNTER — Telehealth: Payer: Self-pay

## 2015-01-21 NOTE — Telephone Encounter (Signed)
Daughter called and reports; Carney BernJean still has diarrhea even after a week of the Lomotil. Denies fever, chills, sweats or weakness. She is drinking fluids. Advised to go on a BRAT diet and take Imodium OTC as directed and to drink half water and half Gatorade. Advised to take her to the ED if symptoms worsen or she starts having fever, chills, sweats or weakness.

## 2015-01-21 NOTE — Telephone Encounter (Signed)
I agree she needs appointment. She/or her daughter worse told this about 4 days ago. She has been on several courses of antibiotics so it's possible she could have C. difficile colitis. Please see if we have anything this week that we could get her in for an acute appointment.

## 2015-01-22 ENCOUNTER — Ambulatory Visit: Payer: Medicare Other | Admitting: Family Medicine

## 2015-01-22 NOTE — Telephone Encounter (Signed)
Scheduled patient to come in for follow up.

## 2015-01-23 ENCOUNTER — Other Ambulatory Visit: Payer: Self-pay | Admitting: Orthopedic Surgery

## 2015-01-24 ENCOUNTER — Telehealth: Payer: Self-pay

## 2015-01-24 DIAGNOSIS — M4716 Other spondylosis with myelopathy, lumbar region: Secondary | ICD-10-CM

## 2015-01-24 NOTE — Telephone Encounter (Signed)
Patient request Hydrocodone  Refill. Patient stated that her surgery is not scheduled til 02/07/2015. Trishna Cwik,CMA

## 2015-01-26 MED ORDER — HYDROCODONE-ACETAMINOPHEN 5-325 MG PO TABS: 1.0000 | ORAL_TABLET | Freq: Four times a day (QID) | ORAL | Status: DC | PRN

## 2015-01-26 NOTE — Telephone Encounter (Signed)
Rx in box. 

## 2015-01-27 NOTE — Telephone Encounter (Signed)
Left detailed message on patient vm advising that Hydrocodone was ready for pickup here at the office. Rhonda Cunningham,CMA

## 2015-01-28 ENCOUNTER — Other Ambulatory Visit (HOSPITAL_COMMUNITY): Payer: Medicare Other

## 2015-01-29 ENCOUNTER — Encounter (HOSPITAL_COMMUNITY)
Admission: RE | Admit: 2015-01-29 | Discharge: 2015-01-29 | Disposition: A | Discharge status: Home / Self Care | Payer: Medicare Other | Source: Ambulatory Visit | Attending: Orthopedic Surgery | Admitting: Orthopedic Surgery

## 2015-01-29 ENCOUNTER — Encounter (HOSPITAL_COMMUNITY): Payer: Self-pay

## 2015-01-29 DIAGNOSIS — Z96651 Presence of right artificial knee joint: Secondary | ICD-10-CM | POA: Diagnosis not present

## 2015-01-29 DIAGNOSIS — L603 Nail dystrophy: Secondary | ICD-10-CM | POA: Diagnosis not present

## 2015-01-29 DIAGNOSIS — L97519 Non-pressure chronic ulcer of other part of right foot with unspecified severity: Secondary | ICD-10-CM | POA: Diagnosis not present

## 2015-01-29 DIAGNOSIS — I739 Peripheral vascular disease, unspecified: Secondary | ICD-10-CM | POA: Diagnosis not present

## 2015-01-29 DIAGNOSIS — Z01812 Encounter for preprocedural laboratory examination: Secondary | ICD-10-CM | POA: Diagnosis present

## 2015-01-29 DIAGNOSIS — Z0183 Encounter for blood typing: Secondary | ICD-10-CM | POA: Diagnosis not present

## 2015-01-29 DIAGNOSIS — M179 Osteoarthritis of knee, unspecified: Secondary | ICD-10-CM | POA: Diagnosis not present

## 2015-01-29 DIAGNOSIS — N390 Urinary tract infection, site not specified: Secondary | ICD-10-CM | POA: Diagnosis not present

## 2015-01-29 DIAGNOSIS — R197 Diarrhea, unspecified: Secondary | ICD-10-CM | POA: Diagnosis not present

## 2015-01-29 DIAGNOSIS — K219 Gastro-esophageal reflux disease without esophagitis: Secondary | ICD-10-CM | POA: Diagnosis not present

## 2015-01-29 DIAGNOSIS — M79609 Pain in unspecified limb: Secondary | ICD-10-CM | POA: Diagnosis not present

## 2015-01-29 DIAGNOSIS — I10 Essential (primary) hypertension: Secondary | ICD-10-CM | POA: Diagnosis not present

## 2015-01-29 DIAGNOSIS — Z87891 Personal history of nicotine dependence: Secondary | ICD-10-CM | POA: Diagnosis not present

## 2015-01-29 DIAGNOSIS — F329 Major depressive disorder, single episode, unspecified: Secondary | ICD-10-CM | POA: Diagnosis not present

## 2015-01-29 DIAGNOSIS — J449 Chronic obstructive pulmonary disease, unspecified: Secondary | ICD-10-CM | POA: Diagnosis not present

## 2015-01-29 DIAGNOSIS — M1712 Unilateral primary osteoarthritis, left knee: Secondary | ICD-10-CM | POA: Diagnosis not present

## 2015-01-29 HISTORY — DX: Major depressive disorder, single episode, unspecified: F32.9

## 2015-01-29 HISTORY — DX: Depression, unspecified: F32.A

## 2015-01-29 HISTORY — DX: Anxiety disorder, unspecified: F41.9

## 2015-01-29 LAB — CBC WITH DIFFERENTIAL/PLATELET
BASOS ABS: 0.1 10*3/uL (ref 0.0–0.1)
BASOS PCT: 1 % (ref 0–1)
Eosinophils Absolute: 0.4 10*3/uL (ref 0.0–0.7)
Eosinophils Relative: 3 % (ref 0–5)
HCT: 35.5 % — ABNORMAL LOW (ref 36.0–46.0)
HEMOGLOBIN: 11.2 g/dL — AB (ref 12.0–15.0)
Lymphocytes Relative: 30 % (ref 12–46)
Lymphs Abs: 3.7 10*3/uL (ref 0.7–4.0)
MCH: 30.1 pg (ref 26.0–34.0)
MCHC: 31.5 g/dL (ref 30.0–36.0)
MCV: 95.4 fL (ref 78.0–100.0)
Monocytes Absolute: 0.7 10*3/uL (ref 0.1–1.0)
Monocytes Relative: 6 % (ref 3–12)
NEUTROS PCT: 60 % (ref 43–77)
Neutro Abs: 7.4 10*3/uL (ref 1.7–7.7)
Platelets: 265 10*3/uL (ref 150–400)
RBC: 3.72 MIL/uL — ABNORMAL LOW (ref 3.87–5.11)
RDW: 15.3 % (ref 11.5–15.5)
WBC: 12.3 10*3/uL — ABNORMAL HIGH (ref 4.0–10.5)

## 2015-01-29 LAB — SURGICAL PCR SCREEN
MRSA, PCR: NEGATIVE
Staphylococcus aureus: NEGATIVE

## 2015-01-29 LAB — URINALYSIS, ROUTINE W REFLEX MICROSCOPIC
Bilirubin Urine: NEGATIVE
Glucose, UA: NEGATIVE mg/dL
Hgb urine dipstick: NEGATIVE
Ketones, ur: NEGATIVE mg/dL
NITRITE: POSITIVE — AB
PROTEIN: NEGATIVE mg/dL
SPECIFIC GRAVITY, URINE: 1.02 (ref 1.005–1.030)
UROBILINOGEN UA: 0.2 mg/dL (ref 0.0–1.0)
pH: 5.5 (ref 5.0–8.0)

## 2015-01-29 LAB — COMPREHENSIVE METABOLIC PANEL
ALBUMIN: 3.1 g/dL — AB (ref 3.5–5.2)
ALT: 16 U/L (ref 0–35)
AST: 19 U/L (ref 0–37)
Alkaline Phosphatase: 114 U/L (ref 39–117)
Anion gap: 8 (ref 5–15)
BUN: 28 mg/dL — ABNORMAL HIGH (ref 6–23)
CALCIUM: 8.9 mg/dL (ref 8.4–10.5)
CO2: 24 mmol/L (ref 19–32)
CREATININE: 1.4 mg/dL — AB (ref 0.50–1.10)
Chloride: 110 mmol/L (ref 96–112)
GFR calc Af Amer: 40 mL/min — ABNORMAL LOW (ref >90)
GFR, EST NON AFRICAN AMERICAN: 34 mL/min — AB (ref >90)
Glucose, Bld: 109 mg/dL — ABNORMAL HIGH (ref 70–99)
Potassium: 4.3 mmol/L (ref 3.5–5.1)
Sodium: 142 mmol/L (ref 135–145)
TOTAL PROTEIN: 5.7 g/dL — AB (ref 6.0–8.3)
Total Bilirubin: 0.5 mg/dL (ref 0.3–1.2)

## 2015-01-29 LAB — TYPE AND SCREEN
ABO/RH(D): O POS
ANTIBODY SCREEN: NEGATIVE

## 2015-01-29 LAB — PROTIME-INR
INR: 1.06 (ref 0.00–1.49)
PROTHROMBIN TIME: 13.9 s (ref 11.6–15.2)

## 2015-01-29 LAB — URINE MICROSCOPIC-ADD ON

## 2015-01-29 LAB — APTT: APTT: 42 s — AB (ref 24–37)

## 2015-01-29 NOTE — Pre-Procedure Instructions (Signed)
Earnstine RegalJean Boback  01/29/2015   Your procedure is scheduled on:  02/07/15  [facility] at [facility] AM.  Call this number if you have problems the morning of surgery: [phone]   Remember:   Do not eat food or drink liquids after midnight.   Take these medicines the morning of surgery with A SIP OF WATER: cymbalta,vicodan,prevacid,lyrica,hygroton   Do not wear jewelry, make-up or nail polish.  Do not wear lotions, powders, or perfumes. You may wear deodorant.  Do not shave 48 hours prior to surgery. Men may shave face and neck.  Do not bring valuables to the hospital.  [facility] is not responsible                  for any belongings or valuables.               Contacts, dentures or bridgework may not be worn into surgery.  Leave suitcase in the car. After surgery it may be brought to your room.  For patients admitted to the hospital, discharge time is determined by your                treatment team.               Patients discharged the day of surgery will not be allowed to drive  home.  Name and phone number of your driver: family  Special Instructions: Incentive Spirometry - Practice and bring it with you on the day of surgery.   Please read over the following fact sheets that you were given: Pain Booklet, Coughing and Deep Breathing, Blood Transfusion Information, MRSA Information and Surgical Site Infection Prevention

## 2015-01-30 ENCOUNTER — Telehealth: Payer: Self-pay | Admitting: Sports Medicine

## 2015-01-30 ENCOUNTER — Other Ambulatory Visit: Payer: Self-pay | Admitting: *Deleted

## 2015-01-30 DIAGNOSIS — I739 Peripheral vascular disease, unspecified: Secondary | ICD-10-CM | POA: Diagnosis not present

## 2015-01-30 DIAGNOSIS — M79609 Pain in unspecified limb: Secondary | ICD-10-CM | POA: Diagnosis not present

## 2015-01-30 DIAGNOSIS — L603 Nail dystrophy: Secondary | ICD-10-CM | POA: Diagnosis not present

## 2015-01-30 DIAGNOSIS — L97519 Non-pressure chronic ulcer of other part of right foot with unspecified severity: Secondary | ICD-10-CM | POA: Diagnosis not present

## 2015-01-30 MED ORDER — LEVOFLOXACIN 750 MG PO TABS: 750.0000 mg | ORAL_TABLET | Freq: Every day | ORAL | Status: DC

## 2015-01-30 MED ORDER — PREDNISONE 10 MG (21) PO TBPK: ORAL_TABLET | ORAL | Status: DC

## 2015-01-30 MED ORDER — DULOXETINE HCL 60 MG PO CPEP: ORAL_CAPSULE | ORAL | Status: DC

## 2015-01-30 NOTE — Assessment & Plan Note (Signed)
With urinary tract infection. Prednisone taper,

## 2015-01-30 NOTE — Telephone Encounter (Addendum)
[facility], patient has nitrites and leukocytes on urinalysis. Knee replacement is coming up, in an effort to avoid another cancellation, I'm going to add antibiotics, and steroids to avoid an additional COPD exacerbation. Prednisone taper, levofloxacin.

## 2015-01-31 ENCOUNTER — Encounter: Payer: Self-pay | Admitting: Sports Medicine

## 2015-01-31 ENCOUNTER — Ambulatory Visit (INDEPENDENT_AMBULATORY_CARE_PROVIDER_SITE_OTHER): Payer: Medicare Other | Admitting: Sports Medicine

## 2015-01-31 VITALS — BP 108/70 | HR 99 | Temp 97.8°F | Ht 66.0 in | Wt 175.0 lb

## 2015-01-31 DIAGNOSIS — N390 Urinary tract infection, site not specified: Secondary | ICD-10-CM

## 2015-01-31 DIAGNOSIS — R197 Diarrhea, unspecified: Secondary | ICD-10-CM | POA: Diagnosis not present

## 2015-01-31 NOTE — Progress Notes (Signed)
  Subjective:    CC: Follow-up  HPI: Urinary tract infection: Diagnosed at the orthopedic office, I was contacted by Gus PumaJim Bethune PA-C, and called in levofloxacin for the patient. She has not yet picked up the antibiotics.  Diarrhea: Is actually improving however has been on several courses of antibiotics. Typically is explosive without blood, some mucus is present. No major abdominal pain, or constitutional symptoms.  Past medical history, Surgical history, Family history not pertinant except as noted below, Social history, Allergies, and medications have been entered into the medical record, reviewed, and no changes needed.   Review of Systems: No fevers, chills, night sweats, weight loss, chest pain, or shortness of breath.   Objective:    General: Well Developed, well nourished, and in no acute distress.  Neuro: Alert and oriented x3, extra-ocular muscles intact, sensation grossly intact.  HEENT: Normocephalic, atraumatic, pupils equal round reactive to light, neck supple, no masses, no lymphadenopathy, thyroid nonpalpable.  Skin: Warm and dry, no rashes. Cardiac: Regular rate and rhythm, no murmurs rubs or gallops, no lower extremity edema.  Respiratory: Clear to auscultation bilaterally. Not using accessory muscles, speaking in full sentences. Abdomen: Soft, minimally tender to palpation in the epigastrium, nondistended, normal bowel sounds, no palpable masses, no costovertebral angle pain.  Impression and Recommendations:

## 2015-01-31 NOTE — Assessment & Plan Note (Signed)
Has had several courses of antibiotics. Diarrhea is improving, checking C. Difficile and stool cultures.

## 2015-01-31 NOTE — Assessment & Plan Note (Signed)
Having a recurrence. Call the levofloxacin. She will go downstairs for a urine culture and urinalysis, has not yet picked up the antibiotic.

## 2015-02-01 LAB — URINALYSIS
Bilirubin Urine: NEGATIVE
Glucose, UA: NEGATIVE mg/dL
Hgb urine dipstick: NEGATIVE
Ketones, ur: NEGATIVE mg/dL
Leukocytes, UA: NEGATIVE
Nitrite: NEGATIVE
Protein, ur: NEGATIVE mg/dL
Specific Gravity, Urine: 1.013 (ref 1.005–1.030)
Urobilinogen, UA: 0.2 mg/dL (ref 0.0–1.0)
pH: 5 (ref 5.0–8.0)

## 2015-02-03 ENCOUNTER — Other Ambulatory Visit: Payer: Self-pay | Admitting: Sports Medicine

## 2015-02-03 LAB — URINE CULTURE: Colony Count: 30000

## 2015-02-04 LAB — C. DIFFICILE GDH AND TOXIN A/B
C. difficile GDH: NOT DETECTED
C. difficile Toxin A/B: NOT DETECTED

## 2015-02-06 MED ORDER — CHLORHEXIDINE GLUCONATE 4 % EX LIQD
60.0000 mL | CUTANEOUS | Status: DC
  Filled 2015-02-06: qty 60

## 2015-02-06 MED ORDER — CEFAZOLIN SODIUM-DEXTROSE 2-3 GM-% IV SOLR
2.0000 g | INTRAVENOUS | Status: AC
  Administered 2015-02-07: 2 g via INTRAVENOUS
  Filled 2015-02-06: qty 50

## 2015-02-07 ENCOUNTER — Inpatient Hospital Stay (HOSPITAL_COMMUNITY): Payer: Medicare Other | Admitting: Anesthesiology

## 2015-02-07 ENCOUNTER — Inpatient Hospital Stay (HOSPITAL_COMMUNITY)
Admission: RE | Admit: 2015-02-07 | Discharge: 2015-02-10 | DRG: 470 | Disposition: A | Discharge status: Skilled Nursing Facility | Payer: Medicare Other | Source: Ambulatory Visit | Attending: Orthopedic Surgery | Admitting: Orthopedic Surgery

## 2015-02-07 ENCOUNTER — Encounter (HOSPITAL_COMMUNITY): Payer: Self-pay | Admitting: *Deleted

## 2015-02-07 ENCOUNTER — Encounter (HOSPITAL_COMMUNITY)
Admission: RE | Disposition: A | Discharge status: Skilled Nursing Facility | Payer: Self-pay | Source: Ambulatory Visit | Attending: Orthopedic Surgery

## 2015-02-07 ENCOUNTER — Ambulatory Visit: Payer: Medicare Other | Admitting: Sports Medicine

## 2015-02-07 DIAGNOSIS — I10 Essential (primary) hypertension: Secondary | ICD-10-CM | POA: Diagnosis not present

## 2015-02-07 DIAGNOSIS — Z87891 Personal history of nicotine dependence: Secondary | ICD-10-CM | POA: Diagnosis not present

## 2015-02-07 DIAGNOSIS — F329 Major depressive disorder, single episode, unspecified: Secondary | ICD-10-CM | POA: Diagnosis present

## 2015-02-07 DIAGNOSIS — J449 Chronic obstructive pulmonary disease, unspecified: Secondary | ICD-10-CM | POA: Diagnosis not present

## 2015-02-07 DIAGNOSIS — Z96652 Presence of left artificial knee joint: Secondary | ICD-10-CM | POA: Diagnosis present

## 2015-02-07 DIAGNOSIS — M47817 Spondylosis without myelopathy or radiculopathy, lumbosacral region: Secondary | ICD-10-CM | POA: Diagnosis not present

## 2015-02-07 DIAGNOSIS — Z96651 Presence of right artificial knee joint: Secondary | ICD-10-CM | POA: Diagnosis present

## 2015-02-07 DIAGNOSIS — K219 Gastro-esophageal reflux disease without esophagitis: Secondary | ICD-10-CM | POA: Diagnosis present

## 2015-02-07 DIAGNOSIS — Z471 Aftercare following joint replacement surgery: Secondary | ICD-10-CM | POA: Diagnosis not present

## 2015-02-07 DIAGNOSIS — F419 Anxiety disorder, unspecified: Secondary | ICD-10-CM | POA: Diagnosis not present

## 2015-02-07 DIAGNOSIS — M25562 Pain in left knee: Secondary | ICD-10-CM | POA: Diagnosis present

## 2015-02-07 DIAGNOSIS — Z7982 Long term (current) use of aspirin: Secondary | ICD-10-CM | POA: Diagnosis not present

## 2015-02-07 DIAGNOSIS — M1712 Unilateral primary osteoarthritis, left knee: Secondary | ICD-10-CM

## 2015-02-07 DIAGNOSIS — M179 Osteoarthritis of knee, unspecified: Secondary | ICD-10-CM | POA: Diagnosis not present

## 2015-02-07 DIAGNOSIS — Z87440 Personal history of urinary (tract) infections: Secondary | ICD-10-CM | POA: Diagnosis not present

## 2015-02-07 DIAGNOSIS — J45909 Unspecified asthma, uncomplicated: Secondary | ICD-10-CM | POA: Diagnosis not present

## 2015-02-07 DIAGNOSIS — M06862 Other specified rheumatoid arthritis, left knee: Secondary | ICD-10-CM | POA: Diagnosis not present

## 2015-02-07 HISTORY — PX: TOTAL KNEE ARTHROPLASTY: SHX125

## 2015-02-07 LAB — STOOL CULTURE

## 2015-02-07 SURGERY — ARTHROPLASTY, KNEE, TOTAL
Anesthesia: Monitor Anesthesia Care | Site: Knee | Laterality: Left

## 2015-02-07 MED ORDER — FENTANYL CITRATE (PF) 100 MCG/2ML IJ SOLN
INTRAMUSCULAR | Status: AC
  Filled 2015-02-07: qty 2

## 2015-02-07 MED ORDER — FENTANYL CITRATE (PF) 100 MCG/2ML IJ SOLN
INTRAMUSCULAR | Status: DC | PRN
  Administered 2015-02-07 (×2): 25 ug via INTRAVENOUS

## 2015-02-07 MED ORDER — POTASSIUM CHLORIDE ER 10 MEQ PO TBCR
10.0000 meq | EXTENDED_RELEASE_TABLET | Freq: Two times a day (BID) | ORAL | Status: DC
  Administered 2015-02-07 – 2015-02-10 (×7): 10 meq via ORAL
  Filled 2015-02-07 (×8): qty 1

## 2015-02-07 MED ORDER — METHOCARBAMOL 500 MG PO TABS: 500.0000 mg | ORAL_TABLET | Freq: Four times a day (QID) | ORAL | Status: DC | PRN

## 2015-02-07 MED ORDER — METHOCARBAMOL 1000 MG/10ML IJ SOLN
500.0000 mg | Freq: Four times a day (QID) | INTRAVENOUS | Status: DC | PRN
  Filled 2015-02-07: qty 5

## 2015-02-07 MED ORDER — DOCUSATE SODIUM 100 MG PO CAPS
100.0000 mg | ORAL_CAPSULE | Freq: Two times a day (BID) | ORAL | Status: DC
  Administered 2015-02-07 – 2015-02-10 (×7): 100 mg via ORAL
  Filled 2015-02-07 (×7): qty 1

## 2015-02-07 MED ORDER — PROMETHAZINE HCL 25 MG/ML IJ SOLN
6.2500 mg | Freq: Four times a day (QID) | INTRAMUSCULAR | Status: DC | PRN
  Filled 2015-02-07: qty 1

## 2015-02-07 MED ORDER — DIPHENOXYLATE-ATROPINE 2.5-0.025 MG PO TABS: 1.0000 | ORAL_TABLET | Freq: Four times a day (QID) | ORAL | Status: DC | PRN

## 2015-02-07 MED ORDER — HYDROMORPHONE HCL 1 MG/ML IJ SOLN
0.5000 mg | INTRAMUSCULAR | Status: DC | PRN
  Administered 2015-02-07 – 2015-02-10 (×2): 1 mg via INTRAVENOUS
  Filled 2015-02-07 (×2): qty 1

## 2015-02-07 MED ORDER — OXYCODONE-ACETAMINOPHEN 5-325 MG PO TABS: 1.0000 | ORAL_TABLET | Freq: Four times a day (QID) | ORAL | Status: DC | PRN

## 2015-02-07 MED ORDER — DEXAMETHASONE SODIUM PHOSPHATE 10 MG/ML IJ SOLN
10.0000 mg | Freq: Four times a day (QID) | INTRAMUSCULAR | Status: AC
  Administered 2015-02-07 – 2015-02-08 (×4): 10 mg via INTRAVENOUS
  Filled 2015-02-07 (×3): qty 1

## 2015-02-07 MED ORDER — DULOXETINE HCL 60 MG PO CPEP
60.0000 mg | ORAL_CAPSULE | Freq: Every day | ORAL | Status: DC
  Administered 2015-02-07 – 2015-02-10 (×4): 60 mg via ORAL
  Filled 2015-02-07 (×4): qty 1

## 2015-02-07 MED ORDER — BUPIVACAINE HCL (PF) 0.5 % IJ SOLN
INTRAMUSCULAR | Status: DC | PRN
  Administered 2015-02-07: 20 mL

## 2015-02-07 MED ORDER — CEFAZOLIN SODIUM-DEXTROSE 2-3 GM-% IV SOLR
2.0000 g | Freq: Four times a day (QID) | INTRAVENOUS | Status: AC
  Administered 2015-02-07 (×2): 2 g via INTRAVENOUS
  Filled 2015-02-07 (×2): qty 50

## 2015-02-07 MED ORDER — PREGABALIN 100 MG PO CAPS
300.0000 mg | ORAL_CAPSULE | Freq: Two times a day (BID) | ORAL | Status: DC
  Administered 2015-02-07 – 2015-02-10 (×7): 300 mg via ORAL
  Filled 2015-02-07 (×7): qty 3

## 2015-02-07 MED ORDER — OXYCODONE-ACETAMINOPHEN 5-325 MG PO TABS
1.0000 | ORAL_TABLET | ORAL | Status: DC | PRN
  Administered 2015-02-07: 1 via ORAL
  Administered 2015-02-07 – 2015-02-08 (×3): 2 via ORAL
  Administered 2015-02-08: 1 via ORAL
  Administered 2015-02-08 – 2015-02-10 (×6): 2 via ORAL
  Filled 2015-02-07: qty 2
  Filled 2015-02-07: qty 1
  Filled 2015-02-07 (×8): qty 2

## 2015-02-07 MED ORDER — SODIUM CHLORIDE 0.9 % IV SOLN
1000.0000 mg | INTRAVENOUS | Status: AC
  Administered 2015-02-07: 1000 mg via INTRAVENOUS
  Filled 2015-02-07: qty 10

## 2015-02-07 MED ORDER — FENTANYL CITRATE (PF) 250 MCG/5ML IJ SOLN
INTRAMUSCULAR | Status: AC
  Filled 2015-02-07: qty 5

## 2015-02-07 MED ORDER — PHENYLEPHRINE 40 MCG/ML (10ML) SYRINGE FOR IV PUSH (FOR BLOOD PRESSURE SUPPORT)
PREFILLED_SYRINGE | INTRAVENOUS | Status: AC
  Filled 2015-02-07: qty 10

## 2015-02-07 MED ORDER — SODIUM CHLORIDE 0.9 % IV SOLN
INTRAVENOUS | Status: DC
  Administered 2015-02-07: 100 mL/h via INTRAVENOUS
  Administered 2015-02-08: 01:00:00 via INTRAVENOUS

## 2015-02-07 MED ORDER — LISINOPRIL 10 MG PO TABS
10.0000 mg | ORAL_TABLET | Freq: Every day | ORAL | Status: DC
  Administered 2015-02-07 – 2015-02-09 (×3): 10 mg via ORAL
  Filled 2015-02-07 (×4): qty 1

## 2015-02-07 MED ORDER — PROMETHAZINE HCL 25 MG/ML IJ SOLN: 6.2500 mg | INTRAMUSCULAR | Status: DC | PRN

## 2015-02-07 MED ORDER — 0.9 % SODIUM CHLORIDE (POUR BTL) OPTIME
TOPICAL | Status: DC | PRN
  Administered 2015-02-07: 1000 mL

## 2015-02-07 MED ORDER — PANTOPRAZOLE SODIUM 40 MG PO TBEC
40.0000 mg | DELAYED_RELEASE_TABLET | Freq: Every day | ORAL | Status: DC
  Administered 2015-02-07 – 2015-02-10 (×4): 40 mg via ORAL
  Filled 2015-02-07 (×4): qty 1

## 2015-02-07 MED ORDER — ASPIRIN EC 325 MG PO TBEC
325.0000 mg | DELAYED_RELEASE_TABLET | Freq: Two times a day (BID) | ORAL | Status: DC
  Administered 2015-02-07 – 2015-02-10 (×5): 325 mg via ORAL
  Filled 2015-02-07 (×5): qty 1

## 2015-02-07 MED ORDER — PRAVASTATIN SODIUM 20 MG PO TABS
20.0000 mg | ORAL_TABLET | Freq: Every day | ORAL | Status: DC
  Administered 2015-02-07 – 2015-02-10 (×4): 20 mg via ORAL
  Filled 2015-02-07 (×4): qty 1

## 2015-02-07 MED ORDER — ASPIRIN EC 325 MG PO TBEC: 325.0000 mg | DELAYED_RELEASE_TABLET | Freq: Two times a day (BID) | ORAL | Status: AC

## 2015-02-07 MED ORDER — ONDANSETRON HCL 4 MG/2ML IJ SOLN: 4.0000 mg | Freq: Four times a day (QID) | INTRAMUSCULAR | Status: DC | PRN

## 2015-02-07 MED ORDER — DEXTROSE 5 % IV SOLN
INTRAVENOUS | Status: DC | PRN
  Administered 2015-02-07: 11:00:00 via INTRAVENOUS

## 2015-02-07 MED ORDER — DIPHENHYDRAMINE HCL 12.5 MG/5ML PO ELIX: 12.5000 mg | ORAL_SOLUTION | ORAL | Status: DC | PRN

## 2015-02-07 MED ORDER — ALUM & MAG HYDROXIDE-SIMETH 200-200-20 MG/5ML PO SUSP: 30.0000 mL | ORAL | Status: DC | PRN

## 2015-02-07 MED ORDER — POLYETHYLENE GLYCOL 3350 17 G PO PACK: 17.0000 g | PACK | Freq: Every day | ORAL | Status: DC | PRN

## 2015-02-07 MED ORDER — METHOCARBAMOL 1000 MG/10ML IJ SOLN
500.0000 mg | INTRAVENOUS | Status: AC
  Administered 2015-02-07: 500 mg via INTRAVENOUS
  Filled 2015-02-07: qty 5

## 2015-02-07 MED ORDER — IPRATROPIUM-ALBUTEROL 20-100 MCG/ACT IN AERS: 1.0000 | INHALATION_SPRAY | Freq: Four times a day (QID) | RESPIRATORY_TRACT | Status: DC | PRN

## 2015-02-07 MED ORDER — IPRATROPIUM-ALBUTEROL 0.5-2.5 (3) MG/3ML IN SOLN: 3.0000 mL | Freq: Four times a day (QID) | RESPIRATORY_TRACT | Status: DC | PRN

## 2015-02-07 MED ORDER — ACETAMINOPHEN 325 MG PO TABS: 650.0000 mg | ORAL_TABLET | Freq: Four times a day (QID) | ORAL | Status: DC | PRN

## 2015-02-07 MED ORDER — SUCCINYLCHOLINE CHLORIDE 20 MG/ML IJ SOLN
INTRAMUSCULAR | Status: AC
  Filled 2015-02-07: qty 1

## 2015-02-07 MED ORDER — HYDROMORPHONE HCL 1 MG/ML IJ SOLN
INTRAMUSCULAR | Status: AC
  Filled 2015-02-07: qty 1

## 2015-02-07 MED ORDER — HYDROMORPHONE HCL 1 MG/ML IJ SOLN
0.2500 mg | INTRAMUSCULAR | Status: DC | PRN
  Administered 2015-02-07: 0.25 mg via INTRAVENOUS

## 2015-02-07 MED ORDER — LACTATED RINGERS IV SOLN
INTRAVENOUS | Status: DC
  Administered 2015-02-07 (×2): via INTRAVENOUS

## 2015-02-07 MED ORDER — BUPIVACAINE LIPOSOME 1.3 % IJ SUSP
20.0000 mL | INTRAMUSCULAR | Status: AC
  Administered 2015-02-07: 20 mL
  Filled 2015-02-07: qty 20

## 2015-02-07 MED ORDER — ACETAMINOPHEN 650 MG RE SUPP: 650.0000 mg | Freq: Four times a day (QID) | RECTAL | Status: DC | PRN

## 2015-02-07 MED ORDER — BUPIVACAINE IN DEXTROSE 0.75-8.25 % IT SOLN
INTRATHECAL | Status: DC | PRN
  Administered 2015-02-07: 13.5 mg via INTRATHECAL

## 2015-02-07 MED ORDER — ONDANSETRON HCL 4 MG PO TABS: 4.0000 mg | ORAL_TABLET | Freq: Four times a day (QID) | ORAL | Status: DC | PRN

## 2015-02-07 MED ORDER — MEPERIDINE HCL 25 MG/ML IJ SOLN: 6.2500 mg | INTRAMUSCULAR | Status: DC | PRN

## 2015-02-07 MED ORDER — BISACODYL 10 MG RE SUPP: 10.0000 mg | Freq: Every day | RECTAL | Status: DC | PRN

## 2015-02-07 MED ORDER — BUDESONIDE-FORMOTEROL FUMARATE 160-4.5 MCG/ACT IN AERO
1.0000 | INHALATION_SPRAY | Freq: Two times a day (BID) | RESPIRATORY_TRACT | Status: DC
  Administered 2015-02-07 – 2015-02-10 (×6): 1 via RESPIRATORY_TRACT
  Filled 2015-02-07: qty 6

## 2015-02-07 MED ORDER — ARTIFICIAL TEARS OP OINT
TOPICAL_OINTMENT | OPHTHALMIC | Status: AC
  Filled 2015-02-07: qty 3.5

## 2015-02-07 MED ORDER — PROPOFOL INFUSION 10 MG/ML OPTIME
INTRAVENOUS | Status: DC | PRN
  Administered 2015-02-07: 25 ug/kg/min via INTRAVENOUS

## 2015-02-07 MED ORDER — MIDAZOLAM HCL 2 MG/2ML IJ SOLN
INTRAMUSCULAR | Status: AC
  Filled 2015-02-07: qty 2

## 2015-02-07 MED ORDER — FUROSEMIDE 40 MG PO TABS
40.0000 mg | ORAL_TABLET | Freq: Every day | ORAL | Status: DC
  Administered 2015-02-07 – 2015-02-10 (×4): 40 mg via ORAL
  Filled 2015-02-07 (×4): qty 1

## 2015-02-07 MED ORDER — CHLORTHALIDONE 25 MG PO TABS
25.0000 mg | ORAL_TABLET | Freq: Every day | ORAL | Status: DC
  Administered 2015-02-07 – 2015-02-09 (×3): 25 mg via ORAL
  Filled 2015-02-07 (×4): qty 1

## 2015-02-07 MED ORDER — ONDANSETRON HCL 4 MG/2ML IJ SOLN
INTRAMUSCULAR | Status: AC
  Filled 2015-02-07: qty 2

## 2015-02-07 MED ORDER — ZOLPIDEM TARTRATE 5 MG PO TABS: 5.0000 mg | ORAL_TABLET | Freq: Every evening | ORAL | Status: DC | PRN

## 2015-02-07 MED ORDER — OXYCODONE-ACETAMINOPHEN 5-325 MG PO TABS
ORAL_TABLET | ORAL | Status: AC
  Filled 2015-02-07: qty 1

## 2015-02-07 MED ORDER — FLEET ENEMA 7-19 GM/118ML RE ENEM: 1.0000 | ENEMA | Freq: Once | RECTAL | Status: AC | PRN

## 2015-02-07 MED ORDER — METHOCARBAMOL 500 MG PO TABS: 500.0000 mg | ORAL_TABLET | Freq: Three times a day (TID) | ORAL | Status: DC | PRN

## 2015-02-07 MED ORDER — LIDOCAINE HCL (CARDIAC) 20 MG/ML IV SOLN
INTRAVENOUS | Status: AC
  Filled 2015-02-07: qty 5

## 2015-02-07 SURGICAL SUPPLY — 68 items
BANDAGE ESMARK 6X9 LF (GAUZE/BANDAGES/DRESSINGS) ×1 IMPLANT
BENZOIN TINCTURE PRP APPL 2/3 (GAUZE/BANDAGES/DRESSINGS) ×3 IMPLANT
BLADE SAGITTAL 25.0X1.19X90 (BLADE) ×2 IMPLANT
BLADE SAGITTAL 25.0X1.19X90MM (BLADE) ×1
BLADE SAW SAG 90X13X1.27 (BLADE) ×3 IMPLANT
BNDG ESMARK 6X9 LF (GAUZE/BANDAGES/DRESSINGS) ×3
BOWL SMART MIX CTS (DISPOSABLE) ×3 IMPLANT
CAP KNEE TOTAL 3 SIGMA ×3 IMPLANT
CEMENT HV SMART SET (Cement) ×6 IMPLANT
CLOSURE STERI-STRIP 1/2X4 (GAUZE/BANDAGES/DRESSINGS) ×1
CLOSURE WOUND 1/2 X4 (GAUZE/BANDAGES/DRESSINGS) ×2
CLSR STERI-STRIP ANTIMIC 1/2X4 (GAUZE/BANDAGES/DRESSINGS) ×2 IMPLANT
COVER SURGICAL LIGHT HANDLE (MISCELLANEOUS) IMPLANT
CUFF TOURNIQUET SINGLE 34IN LL (TOURNIQUET CUFF) ×3 IMPLANT
CUFF TOURNIQUET SINGLE 44IN (TOURNIQUET CUFF) IMPLANT
DRAPE EXTREMITY T 121X128X90 (DRAPE) ×3 IMPLANT
DRAPE IMP U-DRAPE 54X76 (DRAPES) IMPLANT
DRAPE U-SHAPE 47X51 STRL (DRAPES) ×3 IMPLANT
DRSG MEPILEX BORDER 4X12 (GAUZE/BANDAGES/DRESSINGS) IMPLANT
DRSG MEPILEX BORDER 4X8 (GAUZE/BANDAGES/DRESSINGS) ×3 IMPLANT
DRSG PAD ABDOMINAL 8X10 ST (GAUZE/BANDAGES/DRESSINGS) ×3 IMPLANT
DURAPREP 26ML APPLICATOR (WOUND CARE) ×3 IMPLANT
ELECT REM PT RETURN 9FT ADLT (ELECTROSURGICAL) ×3
ELECTRODE REM PT RTRN 9FT ADLT (ELECTROSURGICAL) ×1 IMPLANT
EVACUATOR 1/8 PVC DRAIN (DRAIN) ×6 IMPLANT
FACESHIELD WRAPAROUND (MASK) IMPLANT
GAUZE SPONGE 4X4 12PLY STRL (GAUZE/BANDAGES/DRESSINGS) ×3 IMPLANT
GLOVE BIOGEL PI IND STRL 6.5 (GLOVE) ×1 IMPLANT
GLOVE BIOGEL PI IND STRL 8 (GLOVE) ×2 IMPLANT
GLOVE BIOGEL PI INDICATOR 6.5 (GLOVE) ×2
GLOVE BIOGEL PI INDICATOR 8 (GLOVE) ×4
GLOVE ECLIPSE 7.5 STRL STRAW (GLOVE) IMPLANT
GLOVE SS N UNI LF 6.5 STRL (GLOVE) ×3 IMPLANT
GLOVE SS N UNI LF 7.5 STRL (GLOVE) ×12 IMPLANT
GOWN STRL REUS W/ TWL LRG LVL3 (GOWN DISPOSABLE) ×2 IMPLANT
GOWN STRL REUS W/ TWL XL LVL3 (GOWN DISPOSABLE) ×2 IMPLANT
GOWN STRL REUS W/TWL LRG LVL3 (GOWN DISPOSABLE) ×4
GOWN STRL REUS W/TWL XL LVL3 (GOWN DISPOSABLE) ×4
HANDPIECE INTERPULSE COAX TIP (DISPOSABLE) ×2
HOOD PEEL AWAY FACE SHEILD DIS (HOOD) IMPLANT
IMMOBILIZER KNEE 20 (SOFTGOODS) IMPLANT
IMMOBILIZER KNEE 22 (SOFTGOODS) ×3 IMPLANT
IMMOBILIZER KNEE 22 UNIV (SOFTGOODS) ×3 IMPLANT
KIT BASIN OR (CUSTOM PROCEDURE TRAY) ×3 IMPLANT
KIT ROOM TURNOVER OR (KITS) ×3 IMPLANT
MANIFOLD NEPTUNE II (INSTRUMENTS) ×3 IMPLANT
NEEDLE SPNL 22GX3.5 QUINCKE BK (NEEDLE) ×3 IMPLANT
NS IRRIG 1000ML POUR BTL (IV SOLUTION) ×3 IMPLANT
PACK TOTAL JOINT (CUSTOM PROCEDURE TRAY) ×3 IMPLANT
PACK UNIVERSAL I (CUSTOM PROCEDURE TRAY) ×3 IMPLANT
PAD ARMBOARD 7.5X6 YLW CONV (MISCELLANEOUS) ×6 IMPLANT
PAD CAST 4YDX4 CTTN HI CHSV (CAST SUPPLIES) ×1 IMPLANT
PADDING CAST COTTON 4X4 STRL (CAST SUPPLIES) ×2
SET HNDPC FAN SPRY TIP SCT (DISPOSABLE) ×1 IMPLANT
SPONGE GAUZE 4X4 12PLY STER LF (GAUZE/BANDAGES/DRESSINGS) ×3 IMPLANT
STAPLER VISISTAT 35W (STAPLE) ×3 IMPLANT
STRIP CLOSURE SKIN 1/2X4 (GAUZE/BANDAGES/DRESSINGS) ×4 IMPLANT
SUCTION FRAZIER TIP 10 FR DISP (SUCTIONS) ×3 IMPLANT
SUT MNCRL AB 3-0 PS2 18 (SUTURE) ×3 IMPLANT
SUT VIC AB 0 CTB1 27 (SUTURE) ×6 IMPLANT
SUT VIC AB 1 CT1 27 (SUTURE) ×4
SUT VIC AB 1 CT1 27XBRD ANBCTR (SUTURE) ×2 IMPLANT
SUT VIC AB 2-0 CTB1 (SUTURE) ×6 IMPLANT
SYR 50ML LL SCALE MARK (SYRINGE) ×3 IMPLANT
TOWEL OR 17X24 6PK STRL BLUE (TOWEL DISPOSABLE) ×3 IMPLANT
TOWEL OR 17X26 10 PK STRL BLUE (TOWEL DISPOSABLE) ×3 IMPLANT
TRAY FOLEY CATH 16FRSI W/METER (SET/KITS/TRAYS/PACK) IMPLANT
TRAY FOLEY METER SIL LF 16FR (CATHETERS) ×3 IMPLANT

## 2015-02-07 NOTE — Progress Notes (Signed)
Utilization review completed.  

## 2015-02-07 NOTE — H&P (Signed)
TOTAL KNEE ADMISSION H&P  Patient is being admitted for left total knee arthroplasty.  Subjective:  Chief Complaint:left knee pain.  HPI: Cindy Briggs, 81 y.o. female, has a history of pain and functional disability in the left knee due to arthritis and has failed non-surgical conservative treatments for greater than 12 weeks to includeNSAID's and/or analgesics, corticosteriod injections, viscosupplementation injections, weight reduction as appropriate and activity modification.  Onset of symptoms was gradual, starting 4 years ago with gradually worsening course since that time. The patient noted no past surgery on the left knee(s).  Patient currently rates pain in the left knee(s) at 8 out of 10 with activity. Patient has night pain, worsening of pain with activity and weight bearing, pain that interferes with activities of daily living, pain with passive range of motion, crepitus and joint swelling.  Patient has evidence of subchondral cysts, periarticular osteophytes and joint space narrowing by imaging studies. This patient has had  failure of all reasonable conservative care. There is no active infection.  Patient Active Problem List   Diagnosis Date Noted  . Diarrhea 01/31/2015  . COPD (chronic obstructive pulmonary disease) 12/10/2014  . Lower extremity edema 11/12/2014  . Solitary pulmonary nodule 10/28/2014  . Recurrent UTI 10/28/2014  . Foot pain, bilateral 07/31/2014  . Lumbar spondylosis 04/25/2014  . Osteoarthritis of left knee 04/25/2014  . Preventive measure 04/25/2014   Past Medical History  Diagnosis Date  . Hypertension   . Asthma     as a child  . GERD (gastroesophageal reflux disease)   . Arthritis   . Hepatitis     hepB 30 years ago  . Heart murmur     hx  . Bronchial asthma     at present time started 3 days ago  . Pneumonia 17    hx  . UTI (lower urinary tract infection)     hx  . Depression   . Anxiety     Past Surgical History  Procedure Laterality  Date  . Joint replacement      right knee  . Eye surgery Bilateral   . Hammer toes      10-15 years ago both feet    No prescriptions prior to admission   Allergies  Allergen Reactions  . Latex Dermatitis and Rash    History  Substance Use Topics  . Smoking status: Former Smoker -- 0.50 packs/day for 10 years    Types: Cigarettes    Quit date: 10/09/2014  . Smokeless tobacco: Not on file     Comment: uses vap  . Alcohol Use: No    No family history on file.   ROS ROS: I have reviewed the patient's review of systems thoroughly and there are no positive responses as relates to the HPI.  Objective:  Physical Exam  Vital signs in last 24 hours:   Well-developed well-nourished patient in no acute distress. Alert and oriented x3 HEENT:within normal limits Cardiac: Regular rate and rhythm Pulmonary: Lungs clear to auscultation Abdomen: Soft and nontender.  Normal active bowel sounds  Musculoskeletal: (Left knee: Tender to palpation over the medial joint line.  Trace effusion.  No instability.  Grinding and pain to range of motion.  Range of motion 5-95.  Labs: Recent Results (from the past 2160 hour(s))  Type and screen     Status: None   Collection Time: 12/09/14  1:38 PM  Result Value Ref Range   ABO/RH(D) O POS    Antibody Screen NEG  Sample Expiration 12/23/2014   Surgical pcr screen     Status: None   Collection Time: 12/09/14  1:41 PM  Result Value Ref Range   MRSA, PCR NEGATIVE NEGATIVE   Staphylococcus aureus NEGATIVE NEGATIVE    Comment:        The Xpert SA Assay (FDA approved for NASAL specimens in patients over 42 years of age), is one component of a comprehensive surveillance program.  Test performance has been validated by [facility] for patients greater than or equal to 43 year old. It is not intended to diagnose infection nor to guide or monitor treatment.   Comprehensive metabolic panel     Status: Abnormal   Collection Time: 12/09/14   1:41 PM  Result Value Ref Range   Sodium 139 135 - 145 mmol/L   Potassium 3.8 3.5 - 5.1 mmol/L   Chloride 100 96 - 112 mmol/L   CO2 30 19 - 32 mmol/L   Glucose, Bld 101 (H) 70 - 99 mg/dL   BUN 32 (H) 6 - 23 mg/dL   Creatinine, Ser 1.34 (H) 0.50 - 1.10 mg/dL   Calcium 9.5 8.4 - 10.5 mg/dL   Total Protein 7.4 6.0 - 8.3 g/dL   Albumin 4.2 3.5 - 5.2 g/dL   AST 30 0 - 37 U/L   ALT 17 0 - 35 U/L   Alkaline Phosphatase 126 (H) 39 - 117 U/L   Total Bilirubin 0.9 0.3 - 1.2 mg/dL   GFR calc non Af Amer 36 (L) >90 mL/min   GFR calc Af Amer 42 (L) >90 mL/min    Comment: (NOTE) The eGFR has been calculated using the CKD EPI equation. This calculation has not been validated in all clinical situations. eGFR's persistently <90 mL/min signify possible Chronic Kidney Disease.    Anion gap 9 5 - 15  APTT     Status: Abnormal   Collection Time: 12/09/14  1:41 PM  Result Value Ref Range   aPTT 49 (H) 24 - 37 seconds    Comment:        IF BASELINE aPTT IS ELEVATED, SUGGEST PATIENT RISK ASSESSMENT BE USED TO DETERMINE APPROPRIATE ANTICOAGULANT THERAPY.   CBC WITH DIFFERENTIAL     Status: Abnormal   Collection Time: 12/09/14  1:41 PM  Result Value Ref Range   WBC 12.1 (H) 4.0 - 10.5 K/uL   RBC 4.60 3.87 - 5.11 MIL/uL   Hemoglobin 13.2 12.0 - 15.0 g/dL   HCT 42.7 36.0 - 46.0 %   MCV 92.8 78.0 - 100.0 fL   MCH 28.7 26.0 - 34.0 pg   MCHC 30.9 30.0 - 36.0 g/dL   RDW 15.6 (H) 11.5 - 15.5 %   Platelets 284 150 - 400 K/uL   Neutrophils Relative % 52 43 - 77 %   Neutro Abs 6.4 1.7 - 7.7 K/uL   Lymphocytes Relative 33 12 - 46 %   Lymphs Abs 3.9 0.7 - 4.0 K/uL   Monocytes Relative 7 3 - 12 %   Monocytes Absolute 0.8 0.1 - 1.0 K/uL   Eosinophils Relative 8 (H) 0 - 5 %   Eosinophils Absolute 1.0 (H) 0.0 - 0.7 K/uL   Basophils Relative 0 0 - 1 %   Basophils Absolute 0.0 0.0 - 0.1 K/uL  Protime-INR     Status: None   Collection Time: 12/09/14  1:41 PM  Result Value Ref Range   Prothrombin Time  13.9 11.6 - 15.2 seconds   INR 1.06 0.00 -  1.49  Urinalysis, Routine w reflex microscopic     Status: Abnormal   Collection Time: 12/09/14  1:42 PM  Result Value Ref Range   Color, Urine YELLOW YELLOW   APPearance CLEAR CLEAR   Specific Gravity, Urine 1.025 1.005 - 1.030   pH 5.5 5.0 - 8.0   Glucose, UA NEGATIVE NEGATIVE mg/dL   Hgb urine dipstick NEGATIVE NEGATIVE   Bilirubin Urine NEGATIVE NEGATIVE   Ketones, ur NEGATIVE NEGATIVE mg/dL   Protein, ur NEGATIVE NEGATIVE mg/dL   Urobilinogen, UA 0.2 0.0 - 1.0 mg/dL   Nitrite NEGATIVE NEGATIVE   Leukocytes, UA TRACE (A) NEGATIVE  Urine microscopic-add on     Status: Abnormal   Collection Time: 12/09/14  1:42 PM  Result Value Ref Range   Squamous Epithelial / LPF FEW (A) RARE   WBC, UA 3-6 <3 WBC/hpf   Bacteria, UA FEW (A) RARE  Urinalysis, Routine w reflex microscopic     Status: Abnormal   Collection Time: 01/29/15  2:44 PM  Result Value Ref Range   Color, Urine YELLOW YELLOW   APPearance CLOUDY (A) CLEAR   Specific Gravity, Urine 1.020 1.005 - 1.030   pH 5.5 5.0 - 8.0   Glucose, UA NEGATIVE NEGATIVE mg/dL   Hgb urine dipstick NEGATIVE NEGATIVE   Bilirubin Urine NEGATIVE NEGATIVE   Ketones, ur NEGATIVE NEGATIVE mg/dL   Protein, ur NEGATIVE NEGATIVE mg/dL   Urobilinogen, UA 0.2 0.0 - 1.0 mg/dL   Nitrite POSITIVE (A) NEGATIVE   Leukocytes, UA MODERATE (A) NEGATIVE  Urine microscopic-add on     Status: Abnormal   Collection Time: 01/29/15  2:44 PM  Result Value Ref Range   Squamous Epithelial / LPF RARE RARE   WBC, UA 11-20 <3 WBC/hpf   Bacteria, UA MANY (A) RARE   Casts HYALINE CASTS (A) NEGATIVE   Urine-Other MUCOUS PRESENT   APTT     Status: Abnormal   Collection Time: 01/29/15  2:45 PM  Result Value Ref Range   aPTT 42 (H) 24 - 37 seconds    Comment:        IF BASELINE aPTT IS ELEVATED, SUGGEST PATIENT RISK ASSESSMENT BE USED TO DETERMINE APPROPRIATE ANTICOAGULANT THERAPY.   CBC WITH DIFFERENTIAL     Status:  Abnormal   Collection Time: 01/29/15  2:45 PM  Result Value Ref Range   WBC 12.3 (H) 4.0 - 10.5 K/uL   RBC 3.72 (L) 3.87 - 5.11 MIL/uL   Hemoglobin 11.2 (L) 12.0 - 15.0 g/dL   HCT 35.5 (L) 36.0 - 46.0 %   MCV 95.4 78.0 - 100.0 fL   MCH 30.1 26.0 - 34.0 pg   MCHC 31.5 30.0 - 36.0 g/dL   RDW 15.3 11.5 - 15.5 %   Platelets 265 150 - 400 K/uL   Neutrophils Relative % 60 43 - 77 %   Neutro Abs 7.4 1.7 - 7.7 K/uL   Lymphocytes Relative 30 12 - 46 %   Lymphs Abs 3.7 0.7 - 4.0 K/uL   Monocytes Relative 6 3 - 12 %   Monocytes Absolute 0.7 0.1 - 1.0 K/uL   Eosinophils Relative 3 0 - 5 %   Eosinophils Absolute 0.4 0.0 - 0.7 K/uL   Basophils Relative 1 0 - 1 %   Basophils Absolute 0.1 0.0 - 0.1 K/uL  Comprehensive metabolic panel     Status: Abnormal   Collection Time: 01/29/15  2:45 PM  Result Value Ref Range   Sodium 142 135 - 145  mmol/L   Potassium 4.3 3.5 - 5.1 mmol/L   Chloride 110 96 - 112 mmol/L   CO2 24 19 - 32 mmol/L   Glucose, Bld 109 (H) 70 - 99 mg/dL   BUN 28 (H) 6 - 23 mg/dL   Creatinine, Ser 1.40 (H) 0.50 - 1.10 mg/dL   Calcium 8.9 8.4 - 10.5 mg/dL   Total Protein 5.7 (L) 6.0 - 8.3 g/dL   Albumin 3.1 (L) 3.5 - 5.2 g/dL   AST 19 0 - 37 U/L   ALT 16 0 - 35 U/L   Alkaline Phosphatase 114 39 - 117 U/L   Total Bilirubin 0.5 0.3 - 1.2 mg/dL   GFR calc non Af Amer 34 (L) >90 mL/min   GFR calc Af Amer 40 (L) >90 mL/min    Comment: (NOTE) The eGFR has been calculated using the CKD EPI equation. This calculation has not been validated in all clinical situations. eGFR's persistently <90 mL/min signify possible Chronic Kidney Disease.    Anion gap 8 5 - 15  Protime-INR     Status: None   Collection Time: 01/29/15  2:45 PM  Result Value Ref Range   Prothrombin Time 13.9 11.6 - 15.2 seconds   INR 1.06 0.00 - 1.49  Type and screen     Status: None   Collection Time: 01/29/15  2:45 PM  Result Value Ref Range   ABO/RH(D) O POS    Antibody Screen NEG    Sample Expiration  02/12/2015   Surgical pcr screen     Status: None   Collection Time: 01/29/15  2:45 PM  Result Value Ref Range   MRSA, PCR NEGATIVE NEGATIVE   Staphylococcus aureus NEGATIVE NEGATIVE    Comment:        The Xpert SA Assay (FDA approved for NASAL specimens in patients over 60 years of age), is one component of a comprehensive surveillance program.  Test performance has been validated by [facility] for patients greater than or equal to 84 year old. It is not intended to diagnose infection nor to guide or monitor treatment.   Urinalysis     Status: None   Collection Time: 01/31/15  2:35 PM  Result Value Ref Range   Color, Urine YELLOW YELLOW   APPearance CLEAR CLEAR   Specific Gravity, Urine 1.013 1.005 - 1.030   pH 5.0 5.0 - 8.0   Glucose, UA NEG NEG mg/dL   Bilirubin Urine NEG NEG   Ketones, ur NEG NEG mg/dL   Hgb urine dipstick NEG NEG   Protein, ur NEG NEG mg/dL   Urobilinogen, UA 0.2 0.0 - 1.0 mg/dL   Nitrite NEG NEG   Leukocytes, UA NEG NEG  Urine culture     Status: None   Collection Time: 01/31/15  2:42 PM  Result Value Ref Range   Culture ESCHERICHIA COLI     Comment: SOURCE: URINE&URINE; SOURCE: URINE&URINE; SOURCE: STOOL&STOO COLLECTION KIT GIVEN TO PATIENT. PATIENT ADVISED TO RETURN.    Colony Count 30,000 COLONIES/ML    Organism ID, Bacteria ESCHERICHIA COLI       Susceptibility   Escherichia coli -  (no method available)    AMPICILLIN >=32 Resistant     AMOX/CLAVULANIC 16 Intermediate     AMPICILLIN/SULBACTAM >=32 Resistant     PIP/TAZO <=4 Sensitive     IMIPENEM <=0.25 Sensitive     CEFAZOLIN 16 Intermediate     CEFTRIAXONE <=1 Sensitive     CEFTAZIDIME <=1 Sensitive  CEFEPIME <=1 Sensitive     GENTAMICIN <=1 Sensitive     TOBRAMYCIN <=1 Sensitive     CIPROFLOXACIN >=4 Resistant     LEVOFLOXACIN >=8 Resistant     NITROFURANTOIN <=16 Sensitive     TRIMETH/SULFA >=320 Resistant   Stool culture     Status: None (Preliminary result)    Collection Time: 02/03/15  2:32 PM  Result Value Ref Range   Preliminary Report Culture reincubated for better growth      Estimated body mass index is 29.34 kg/(m^2) as calculated from the following:   Height as of 01/10/15: '5\' 4"'  (1.626 m).   Weight as of 01/10/15: 171 lb (77.565 kg).   Imaging Review Plain radiographs demonstrate severe degenerative joint disease of the left knee(s). The overall alignment ismild varus. The bone quality appears to be good for age and reported activity level.  Assessment/Plan:  End stage arthritis, left knee   The patient history, physical examination, clinical judgment of the provider and imaging studies are consistent with end stage degenerative joint disease of the left knee(s) and total knee arthroplasty is deemed medically necessary. The treatment options including medical management, injection therapy arthroscopy and arthroplasty were discussed at length. The risks and benefits of total knee arthroplasty were presented and reviewed. The risks due to aseptic loosening, infection, stiffness, patella tracking problems, thromboembolic complications and other imponderables were discussed. The patient acknowledged the explanation, agreed to proceed with the plan and consent was signed. Patient is being admitted for inpatient treatment for surgery, pain control, PT, OT, prophylactic antibiotics, VTE prophylaxis, progressive ambulation and ADL's and discharge planning. The patient is planning to be discharged to skilled nursing facility

## 2015-02-07 NOTE — Anesthesia Preprocedure Evaluation (Addendum)
Anesthesia Evaluation  Patient identified by MRN, date of birth, ID band Patient awake    Reviewed: Allergy & Precautions, NPO status , Patient's Chart, lab work & pertinent test results  Airway Mallampati: II  TM Distance: >3 FB Neck ROM: Full    Dental no notable dental hx. (+) Teeth Intact   Pulmonary asthma , pneumonia -, COPD COPD inhaler, former smoker,  breath sounds clear to auscultation  Pulmonary exam normal       Cardiovascular hypertension, Pt. on medications negative cardio ROS Normal cardiovascular exam+ Valvular Problems/Murmurs Rhythm:Regular Rate:Normal     Neuro/Psych PSYCHIATRIC DISORDERS Anxiety Depression negative neurological ROS     GI/Hepatic GERD-  Medicated and Controlled,(+) Hepatitis -  Endo/Other  negative endocrine ROS  Renal/GU negative Renal ROS     Musculoskeletal  (+) Arthritis -,   Abdominal   Peds  Hematology negative hematology ROS (+)   Anesthesia Other Findings   Reproductive/Obstetrics negative OB ROS                          Anesthesia Physical Anesthesia Plan  ASA: III  Anesthesia Plan: Spinal and MAC   Post-op Pain Management:    Induction:   Airway Management Planned:   Additional Equipment:   Intra-op Plan:   Post-operative Plan:   Informed Consent: I have reviewed the patients History and Physical, chart, labs and discussed the procedure including the risks, benefits and alternatives for the proposed anesthesia with the patient or authorized representative who has indicated his/her understanding and acceptance.   Dental advisory given  Plan Discussed with: CRNA  Anesthesia Plan Comments:       Anesthesia Quick Evaluation                                  Anesthesia Evaluation  Patient identified by MRN, date of birth, ID band Patient awake    Reviewed: Allergy & Precautions, NPO status , Patient's Chart, lab work &  pertinent test results  Airway Mallampati: II  TM Distance: >3 FB Neck ROM: Full    Dental no notable dental hx.    Pulmonary asthma , former smoker,  breath sounds clear to auscultation  Pulmonary exam normal       Cardiovascular hypertension, Pt. on medications Rhythm:Regular Rate:Normal     Neuro/Psych negative neurological ROS  negative psych ROS   GI/Hepatic GERD-  ,(+) Hepatitis -  Endo/Other  negative endocrine ROS  Renal/GU negative Renal ROS     Musculoskeletal  (+) Arthritis -,   Abdominal   Peds  Hematology negative hematology ROS (+)   Anesthesia Other Findings   Reproductive/Obstetrics negative OB ROS                             Anesthesia Physical Anesthesia Plan  ASA: III  Anesthesia Plan:    Post-op Pain Management:    Induction: Intravenous  Airway Management Planned:   Additional Equipment:   Intra-op Plan:   Post-operative Plan: Extubation in OR  Informed Consent: I have reviewed the patients History and Physical, chart, labs and discussed the procedure including the risks, benefits and alternatives for the proposed anesthesia with the patient or authorized representative who has indicated his/her understanding and acceptance.   Dental advisory given  Plan Discussed with: CRNA  Anesthesia Plan Comments:  Anesthesia Quick Evaluation

## 2015-02-07 NOTE — Evaluation (Signed)
Physical Therapy Evaluation Patient Details Name: Cindy Briggs MRN: [id number] DOB: 1931/09/25 Today's Date: 02/07/2015   History of Present Illness  81 y.o. female s/p Lt total knee arthroplasty. Past Hx of Rt TKA.  Clinical Impression  Pt is s/p left TKA, presenting with the deficits listed below (see PT Problem List). Ambulates slowly with min assist up to 10 feet x2, post op day zero. No significant buckling noted with knee immobilizer in place. Tolerated therapeutic exercises well and reviewed knee precautions. Will continue to follow and progress, updating d/c recommendations as appropriate. Pt will benefit from skilled PT to increase their independence and safety with mobility to allow discharge to the venue listed below.      Follow Up Recommendations SNF;Supervision for mobility/OOB    Equipment Recommendations  None recommended by PT    Recommendations for Other Services       Precautions / Restrictions Precautions Precautions: Knee Precaution Booklet Issued: Yes (comment) Precaution Comments: reveiwed precautions and exercises Required Braces or Orthoses: Knee Immobilizer - Left Knee Immobilizer - Left: On when out of bed or walking Restrictions Weight Bearing Restrictions: Yes LLE Weight Bearing: Weight bearing as tolerated      Mobility  Bed Mobility Overal bed mobility: Needs Assistance Bed Mobility: Supine to Sit     Supine to sit: Min assist;HOB elevated     General bed mobility comments: Min assist for LLE support. Requires extra time, use of rail. VC for technique.  Transfers Overall transfer level: Needs assistance Equipment used: Rolling walker (2 wheeled) Transfers: Sit to/from Stand Sit to Stand: Min assist         General transfer comment: Min assist for boost to stand from lowest bed setting and BSC. Slow to rise. VC for hand placement, anterior weight shift for leverage, and to bring LLE under trunk for support upon  standing.  Ambulation/Gait Ambulation/Gait assistance: Min assist Ambulation Distance (Feet): 10 Feet (x2) Assistive device: Rolling walker (2 wheeled) Gait Pattern/deviations: Step-to pattern;Decreased step length - right;Decreased stance time - left;Antalgic;Trunk flexed Gait velocity: slow   General Gait Details: Min assist for stability up to 10 feet x2. Demonstrating good knee stability with knee immobilizer in place, did not notice any overt buckling. Bears good amount of weight through LLE although relies heavily on RW. Cues for upright posture with significantly flexed trunk. VC for Lt knee extension in stance phase for quad activation  Stairs            Wheelchair Mobility    Modified Rankin (Stroke Patients Only)       Balance Overall balance assessment: Needs assistance Sitting-balance support: No upper extremity supported;Feet supported Sitting balance-Leahy Scale: Good     Standing balance support: Single extremity supported Standing balance-Leahy Scale: Poor Standing balance comment: Pre-gait balance training while standing with weight shifting onto LLE, accepting weight with RW for support as needed.                             Pertinent Vitals/Pain Pain Assessment: 0-10 Pain Score: 8  Pain Location: Lt knee Pain Descriptors / Indicators: Aching Pain Intervention(s): Monitored during session;Repositioned    Home Living Family/patient expects to be discharged to:: Private residence Living Arrangements: Children Available Help at Discharge: Family;Available 24 hours/day Type of Home: Independent living facility Home Access: Level entry     Home Layout: One level Home Equipment: Walker - 2 wheels;Cane - single point;Bedside commode;Shower seat - built in  Prior Function Level of Independence: Needs assistance   Gait / Transfers Assistance Needed: RW for mobility  ADL's / Homemaking Assistance Needed: needed assist exiting  shower        Hand Dominance   Dominant Hand: Right    Extremity/Trunk Assessment   Upper Extremity Assessment: Defer to OT evaluation           Lower Extremity Assessment: LLE deficits/detail   LLE Deficits / Details: decreased strength and ROM as expected post op.     Communication   Communication: No difficulties  Cognition Arousal/Alertness: Awake/alert Behavior During Therapy: WFL for tasks assessed/performed Overall Cognitive Status: Within Functional Limits for tasks assessed                      General Comments General comments (skin integrity, edema, etc.): Had well formed bowel movement in toilet.    Exercises Total Joint Exercises Ankle Circles/Pumps: AROM;Both;10 reps;Seated Quad Sets: AROM;Left;10 reps;Seated Gluteal Sets: Strengthening;Both;10 reps;Seated      Assessment/Plan    PT Assessment Patient needs continued PT services  PT Diagnosis Difficulty walking;Abnormality of gait;Acute pain   PT Problem List Decreased strength;Decreased range of motion;Decreased activity tolerance;Decreased balance;Decreased mobility;Decreased knowledge of use of DME;Decreased knowledge of precautions;Pain  PT Treatment Interventions DME instruction;Gait training;Functional mobility training;Therapeutic activities;Therapeutic exercise;Balance training;Neuromuscular re-education;Patient/family education;Modalities   PT Goals (Current goals can be found in the Care Plan section) Acute Rehab PT Goals Patient Stated Goal: None stated PT Goal Formulation: With patient Time For Goal Achievement: 02/14/15 Potential to Achieve Goals: Good    Frequency BID   Barriers to discharge        Co-evaluation               End of Session Equipment Utilized During Treatment: Gait belt;Left knee immobilizer Activity Tolerance: Patient tolerated treatment well Patient left: in chair;with call bell/phone within reach;with family/visitor present Nurse  Communication: Mobility status         Time: 1715-1815 (- 10 min non-therapeutic while RN gave meds, and pt toileted) PT Time Calculation (min) (ACUTE ONLY): 60 min   Charges:   PT Evaluation $Initial PT Evaluation Tier I: 1 Procedure PT Treatments $Gait Training: 8-22 mins $Therapeutic Activity: 8-22 mins   PT G Codes[place]:        Alexyia Guarino S 02/07/2015, 6:41 PM Sunday SpillersLogan Secor Krak[place]Bar[place], [place]PT [phone]

## 2015-02-07 NOTE — Transfer of Care (Signed)
Immediate Anesthesia Transfer of Care Note  Patient: Cindy Briggs  Procedure(s) Performed: Procedure(s): TOTAL KNEE ARTHROPLASTY (Left)  Patient Location: PACU  Anesthesia Type:Spinal  Level of Consciousness: awake, alert  and patient cooperative  Airway & Oxygen Therapy: Patient Spontanous Breathing and Patient connected to nasal cannula oxygen  Post-op Assessment: Report given to RN, Post -op Vital signs reviewed and stable and Patient moving all extremities  Post vital signs: Reviewed and stable  Last Vitals:  Filed Vitals:   02/07/15 0701  BP: 155/78  Pulse: 72  Temp: 36.6 C  Resp: 20    Complications: No apparent anesthesia complications

## 2015-02-07 NOTE — Progress Notes (Signed)
Orthopedic Tech Progress Note Patient Details:  Cindy Briggs Jul 24, 1931 [id number]  CPM Left Knee CPM Left Knee: On Left Knee Flexion (Degrees): 90 Left Knee Extension (Degrees): 0 Additional Comments: trapeze bar patient helper Viewed order from doctor's order list  Nikki DomCrawford, Ulrich Soules 02/07/2015, 1:40 PM

## 2015-02-07 NOTE — Anesthesia Postprocedure Evaluation (Signed)
Anesthesia Post Note  Patient: Cindy Briggs  Procedure(s) Performed: Procedure(s) (LRB): TOTAL KNEE ARTHROPLASTY (Left)  Anesthesia type: Spinal  Patient location: PACU  Post pain: Pain level controlled  Post assessment: Post-op Vital signs reviewed  Last Vitals: BP 148/60 mmHg  Pulse 61  Temp(Src) 36.5 C (Oral)  Resp 17  Ht 5\' 6"  (1.676 m)  Wt 175 lb (79.379 kg)  BMI 28.26 kg/m2  SpO2 100%  Post vital signs: Reviewed  Level of consciousness: sedated  Complications: No apparent anesthesia complications

## 2015-02-07 NOTE — Anesthesia Procedure Notes (Signed)
Spinal Patient location during procedure: OR Staffing Anesthesiologist: ,  Performed by: anesthesiologist  Preanesthetic Checklist Completed: patient identified, site marked, surgical consent, pre-op evaluation, timeout performed, IV checked, risks and benefits discussed and monitors and equipment checked Spinal Block Patient position: sitting Prep: Betadine Patient monitoring: heart rate, continuous pulse ox and blood pressure Approach: right paramedian Location: L3-4 Injection technique: single-shot Needle Needle type: Quincke  Needle gauge: 22 G Needle length: 9 cm Assessment Sensory level: T8 Additional Notes Expiration date of kit checked and confirmed. Patient tolerated procedure well, without complications.     

## 2015-02-07 NOTE — Discharge Instructions (Signed)

## 2015-02-07 NOTE — Brief Op Note (Signed)
02/07/2015  12:43 PM  PATIENT:  Cindy Briggs  81 y.o. female  PRE-OPERATIVE DIAGNOSIS:  osteoarthritis left knee  POST-OPERATIVE DIAGNOSIS:  osteoarthritis left knee  PROCEDURE:  Procedure(s): TOTAL KNEE ARTHROPLASTY (Left)  SURGEON:  Surgeon(s) and Role:    * Jodi GeraldsJohn Jaisen Wiltrout, MD - Primary  PHYSICIAN ASSISTANT:   ASSISTANTS: bethune   ANESTHESIA:   spinal  EBL:  Total I/O In: -  Out: 300 [Urine:250; Blood:50]  BLOOD ADMINISTERED:none  DRAINS: (1) Hemovact drain(s) in the l knee with  Suction Open   LOCAL MEDICATIONS USED:  OTHER experel  SPECIMEN:  No Specimen  DISPOSITION OF SPECIMEN:  N/A  COUNTS:  YES  TOURNIQUET:   Total Tourniquet Time Documented: Thigh (Left) - 55 minutes Total: Thigh (Left) - 55 minutes   DICTATION: .Other Dictation: Dictation Number [id number]  PLAN OF CARE: Admit to inpatient   PATIENT DISPOSITION:  PACU - hemodynamically stable.   Delay start of Pharmacological VTE agent (>24hrs) due to surgical blood loss or risk of bleeding: no

## 2015-02-08 LAB — CBC
HCT: 34.6 % — ABNORMAL LOW (ref 36.0–46.0)
Hemoglobin: 10.9 g/dL — ABNORMAL LOW (ref 12.0–15.0)
MCH: 29.6 pg (ref 26.0–34.0)
MCHC: 31.5 g/dL (ref 30.0–36.0)
MCV: 94 fL (ref 78.0–100.0)
PLATELETS: 224 10*3/uL (ref 150–400)
RBC: 3.68 MIL/uL — ABNORMAL LOW (ref 3.87–5.11)
RDW: 15.7 % — ABNORMAL HIGH (ref 11.5–15.5)
WBC: 11.6 10*3/uL — AB (ref 4.0–10.5)

## 2015-02-08 LAB — BASIC METABOLIC PANEL
ANION GAP: 9 (ref 5–15)
BUN: 29 mg/dL — ABNORMAL HIGH (ref 6–20)
CALCIUM: 8.5 mg/dL — AB (ref 8.9–10.3)
CO2: 27 mmol/L (ref 22–32)
Chloride: 102 mmol/L (ref 101–111)
Creatinine, Ser: 1.14 mg/dL — ABNORMAL HIGH (ref 0.44–1.00)
GFR, EST AFRICAN AMERICAN: 51 mL/min — AB (ref >60)
GFR, EST NON AFRICAN AMERICAN: 44 mL/min — AB (ref >60)
Glucose, Bld: 140 mg/dL — ABNORMAL HIGH (ref 70–99)
Potassium: 4.2 mmol/L (ref 3.5–5.1)
Sodium: 138 mmol/L (ref 135–145)

## 2015-02-08 NOTE — Progress Notes (Signed)
PATIENT ID: Cindy Briggs  MRN: [id number]  DOB/AGE:  05/13/1931 / 81 y.o.  1 Day Post-Op Procedure(s) (LRB): TOTAL KNEE ARTHROPLASTY (Left)    PROGRESS NOTE Subjective: Patient is alert, oriented, no Nausea, no Vomiting, yes passing gas, yes Bowel Movement. Taking PO well. Denies SOB, Chest or Calf Pain. Using Incentive Spirometer, PAS in place. Ambulate WBAT, CPM 0-40 Patient reports pain as mild  .    Objective: Vital signs in last 24 hours: Filed Vitals:   02/07/15 1433 02/07/15 1952 02/07/15 2013 02/08/15 0648  BP: 148/60 95/58  154/80  Pulse: 61 67    Temp: 97.7 F (36.5 C) 98.1 F (36.7 C)  97.8 F (36.6 C)  TempSrc: Oral Oral  Oral  Resp: 17     Height:      Weight:      SpO2: 100% 96% 92% 100%      Intake/Output from previous day: I/O last 3 completed shifts: In: 3053.3 [P.O.:150; I.V.:2748.3; IV Piggyback:155] Out: 2405 [Urine:1975; Drains:380; Blood:50]   Intake/Output this shift:     LABORATORY DATA:  Recent Labs  02/08/15 0540  WBC 11.6*  HGB 10.9*  HCT 34.6*  PLT 224  NA 138  K 4.2  CL 102  CO2 27  BUN 29*  CREATININE 1.14*  GLUCOSE 140*  CALCIUM 8.5*    Examination: Neurologically intact Neurovascular intact Sensation intact distally Intact pulses distally Dorsiflexion/Plantar flexion intact Incision: dressing C/D/I No cellulitis present Compartment soft} Blood and plasma separated in drain indicating minimal recent drainage, drain pulled without difficulty.  Assessment:   1 Day Post-Op Procedure(s) (LRB): TOTAL KNEE ARTHROPLASTY (Left) ADDITIONAL DIAGNOSIS: Expected Acute Blood Loss Anemia, COPD  Plan: PT/OT WBAT, CPM 5/hrs day until ROM 0-90 degrees, then D/C CPM DVT Prophylaxis:  SCDx72hrs, ASA 325 mg BID x 2 weeks DISCHARGE PLAN: Home, Sunday or Monday DISCHARGE NEEDS: HHPT, HHRN, CPM, Walker and 3-in-1 comode seat     Cindy Briggs R 02/08/2015, 7:39 AM

## 2015-02-08 NOTE — Progress Notes (Signed)
Physical Therapy Treatment Patient Details Name: Earnstine RegalJean Pipe MRN: [id number] DOB: 01-12-1931 Today's Date: 02/08/2015    History of Present Illness 81 y.o. female s/p Lt total knee arthroplasty. Past Hx of Rt TKA.    PT Comments    Pt continues to progress with exercises, ROM and gait. In CPM end of session .   Follow Up Recommendations  SNF;Supervision for mobility/OOB     Equipment Recommendations  None recommended by PT    Recommendations for Other Services       Precautions / Restrictions Precautions Precautions: Knee Precaution Comments: reveiwed precautions and exercises Required Braces or Orthoses: Knee Immobilizer - Left Knee Immobilizer - Left: On when out of bed or walking Restrictions LLE Weight Bearing: Weight bearing as tolerated    Mobility  Bed Mobility Overal bed mobility: Needs Assistance Bed Mobility: Sit to Supine     Supine to sit: Min guard Sit to supine: Min assist   General bed mobility comments: min assist to bring legs fully onto surface  Transfers Overall transfer level: Needs assistance   Transfers: Sit to/from Stand Sit to Stand: Supervision         General transfer comment: cues for hand placement  Ambulation/Gait Ambulation/Gait assistance: Min guard Ambulation Distance (Feet): 125 Feet Assistive device: Rolling walker (2 wheeled) Gait Pattern/deviations: Step-through pattern;Decreased stride length;Trunk flexed   Gait velocity interpretation: Below normal speed for age/gender General Gait Details: cues for posture and position in RW   Stairs            Wheelchair Mobility    Modified Rankin (Stroke Patients Only)       Balance                                    Cognition Arousal/Alertness: Awake/alert Behavior During Therapy: WFL for tasks assessed/performed Overall Cognitive Status: Within Functional Limits for tasks assessed       Memory: Decreased short-term memory               Exercises Total Joint Exercises Short Arc Quad: AROM;Left;15 reps;Supine Heel Slides: AROM;Left;15 reps;Supine CPM 0-90 end of session     General Comments        Pertinent Vitals/Pain Pain Assessment: No/denies pain     Home Living                      Prior Function            PT Goals (current goals can now be found in the care plan section) Progress towards PT goals: Progressing toward goals    Frequency  7X/week    PT Plan Current plan remains appropriate;    Co-evaluation             End of Session Equipment Utilized During Treatment: Left knee immobilizer Activity Tolerance: Patient tolerated treatment well Patient left: in bed;with call bell/phone within reach;with family/visitor present     Time: 4098-11911455-1519 PT Time Calculation (min) (ACUTE ONLY): 24 min  Charges:  $Gait Training: 8-22 mins $Therapeutic Exercise: 8-22 mins                     G Codes:      Delorse Lekabor, Novice Vrba Beth 02/08/2015, 3:45 PM Delaney MeigsMaija Tabor Carrington Mullenax, PT [phone]

## 2015-02-08 NOTE — Clinical Social Work Placement (Signed)
   CLINICAL SOCIAL WORK PLACEMENT  NOTE  Date:  02/08/2015  Patient Details  Name: Cindy Briggs MRN: [id number] Date of Birth: 04-12-1931  Clinical Social Work is seeking post-discharge placement for this patient at the Skilled  Nursing Facility level of care (*CSW will initial, date and re-position this form in  chart as items are completed):  Yes   Patient/family provided with [facility] Clinical Social Work Department's list of facilities offering this level of care within the geographic area requested by the patient (or if unable, by the patient's family).  Yes   Patient/family informed of their freedom to choose among providers that offer the needed level of care, that participate in Medicare, Medicaid or managed care program needed by the patient, have an available bed and are willing to accept the patient.  Yes   Patient/family informed of 's ownership interest in [facility] and [facility], as well as of the fact that they are under no obligation to receive care at these facilities.  PASRR submitted to EDS on       PASRR number received on       Existing PASRR number confirmed on       FL2 transmitted to all facilities in geographic area requested by pt/family on 02/08/15 [facility]    FL2 transmitted to all facilities within larger geographic area on 02/08/15  [facility]    Patient informed that his/her managed care company has contracts with or will negotiate with certain facilities, including the following:            Patient/family informed of bed offers received.  Patient chooses bed at       Physician recommends and patient chooses bed at      Patient to be transferred to   on  .  Patient to be transferred to facility by       Patient family notified on   of transfer.  Name of family member notified:        PHYSICIAN       Additional Comment:    _______________________________________________ Cristobal Goldmannrawford, Azlynn Mitnick  Bradley, LCSW 02/08/2015, 6:46 PM

## 2015-02-08 NOTE — Care Management Note (Deleted)
Case Management Note  Patient Details  Name: Earnstine RegalJean Rathje MRN: [id number] Date of Birth: [birth year]  Subjective/Objective:                    Action/Plan:   Expected Discharge Date:                  Expected Discharge Plan:  Skilled Nursing Facility  In-House Referral:  Clinical Social Work  Discharge planning Services  CM Consult  Post Acute Care Choice:    Choice offered to:     DME Arranged:    DME Agency:     HH Arranged:    HH Agency:     Status of Service:  Completed, signed off  Medicare Important Message Given:    Date Medicare IM Given:    Medicare IM give by:    Date Additional Medicare IM Given:    Additional Medicare Important Message give by:     If discussed at Long Length of Stay Meetings, dates discussed:    Additional Comments:  Yves DillJeffries, Doxie Augenstein Christine, RN 02/08/2015, 2:47 PM

## 2015-02-08 NOTE — Op Note (Signed)
NAMGust Brooms:  [place], Zayli                   ACCOUNT NO.:  [id number]  MEDICAL RECORD NO.:  [id number]  LOCATION:  [id number]                        FACILITY:  [facility]  PHYSICIAN:  Harvie JuniorJohn L. Chesley Valls, M.D.   DATE OF BIRTH:  [birth year]  DATE OF PROCEDURE:  02/07/2015 DATE OF DISCHARGE:                              OPERATIVE REPORT   PREOPERATIVE DIAGNOSIS:  End-stage degenerative joint disease, left knee.  POSTOPERATIVE DIAGNOSIS:  End-stage degenerative joint disease, left knee.  PRINCIPAL PROCEDURE:  Left total knee replacement with a Sigma system size 4 femur, size 4 tibia, 10-mm bridging bearing, and a 35-mm all- polyethylene patella.  SURGEON:  Harvie JuniorJohn L. Surina Storts, M.D.  ASSISTANT:  Marshia LyJames Bethune, P.A.  ANESTHESIA:  Spinal.  BRIEF HISTORY:  Mrs. Benjamine MolaVann is an 81 year old female with a long history of significant complaints of bilateral knee pain.  She had a right total knee done year ago and was done well with that.  She moved to this area with having severe pain in the left knee, and after failure of all conservative care, she was taken to the operating room for total knee replacement.  The patient was having light activity pain, night pain and had x-rays showing bone-on-bone change and after failure of all conservative care, she was taken to the operating room for total knee replacement.  DESCRIPTION OF PROCEDURE:  The patient was taken to the operating room. After adequate anesthesia was obtained with general anesthetic, the patient was placed supine on the operating table.  The left leg was prepped and draped in usual sterile fashion.  Following this, the leg was exsanguinated, blood pressure tourniquet was inflated to 300 mmHg. Following this, a midline incision was made in the subcutaneous tissue down the level of the extensor mechanism and medial parapatellar arthrotomy was undertaken.  Following this, synovium in the anterior aspect of the femur, retropatellar fat pad, anterior and  posterior cruciates and medial and lateral meniscus were removed.  Attention was turned to the femur, intramedullary pilot hole was drilled, 5-degree distal valgus inclination with 11 distal bone was cut.  Once this was done, the tibia was sized initially to about a 2.75.  We put the 3 block on initially and just was cutting nothing in the back, so went to a 2.5 and then posteriorlized the component and this gave us excellent fit. Did anterior and posterior cuts, chamfers and box and then I turned to the tibia, sized to a 3, it was drilled and keeled.  Attention was then turned towards placing the trial components, they replaced.  The knee was put through a range of motion with excellent stability was achieved. Attention was turned to the patella, which was severely arthritic, was cut down to the level of 13 mm and a 35 paddle was chosen.  The lugs were drilled and the trial poly was placed.  The knee was put through a range of motion, checked for stability, this was excellent.  At this point, the trial components were removed.  The knee was copiously lavaged, pulse elevators, irrigation and suctioned dry.  The final components were then placed, size 3 tibia, 2.5 femur, 10-mm bridging bearing trial was  placed and the 35 all poly patella was placed.  Once this was done, attention was turned towards allowing the cement to harden.  All cement had harden and all excess bone cement was removed. Tourniquet was let down.  At this point, all bleeders were controlled with electrocautery.  The trial poly was removed and the final poly was placed, size 10, excellent stability and range of motion were achieved. The clamp was removed from the patella.  The knee was irrigated, suctioned dry and then finally closed with 1 Vicryl running from the top down to the middle and then 1 Vicryl running from the bottom down to the middle.  The sutures were tied here.  The skin was then closed carefully with 2-0  Vicryl and staples.  Sterile compressive dressing was applied. The patient was taken to the recovery room, she was noted to be in satisfactory condition.  Estimated blood loss for the procedure was minimal.     Harvie JuniorJohn L. Raphael Espe, M.D.     Ranae PlumberJLG/MEDQ  D:  02/07/2015  T:  02/08/2015  Job:  [id number]

## 2015-02-08 NOTE — Care Management Note (Addendum)
Case Management Note  Patient Details  Name: Cindy Briggs MRN: [id number] Date of Birth: 1931/02/24  Subjective/Objective:                   TOTAL KNEE ARTHROPLASTY (Left) Action/Plan:  Discharge planning Expected Discharge Date:                  Expected Discharge Plan:  Skilled Nursing Facility  In-House Referral:  Clinical Social Work  Discharge planning Services  CM Consult  Post Acute Care Choice:    Choice offered to:     DME Arranged:    DME Agency:     HH Arranged:    HH Agency:     Status of Service:  Completed, signed off  Medicare Important Message Given:    Date Medicare IM Given:    Medicare IM give by:    Date Additional Medicare IM Given:    Additional Medicare Important Message give by:     If discussed at Long Length of Stay Meetings, dates discussed:    Additional Comments: CM spoke with pt as to disposition.  Pt states she is going to SNF for rehab; PT recc. Is for SNF.  CM called CSW to make aware.  No other CM needs were communicated.   Yves DillJeffries, Rochele Lueck Christine, RN 02/08/2015, 2:48 PM

## 2015-02-08 NOTE — Clinical Social Work Note (Signed)
Clinical Social Work Assessment  Patient Details  Name: Cindy Briggs MRN: [id number] Date of Birth: 06-10-1931  Date of referral:  02/08/15               Reason for consult:  Facility Placement                Permission sought to share information with:  Family Supports Permission granted to share information::  Yes, Verbal Permission Granted  Name::     Daughters Reatha HarpsPam Montgomery and Tawnya CrookKaren Shumake  Agency::     Relationship::  Patient's daughter's  Contact Information:   Ms. Faythe GheeShumake's phone number - [phone]). Ms. Montgomery's number listed under family contacts  Housing/Transportation Living arrangements for the past 2 months:  Apartment Source of Information:  Adult Children Patient Interpreter Needed:  None Criminal Activity/Legal Involvement Pertinent to Current Situation/Hospitalization:  No - Comment as needed Significant Relationships:  Adult Children Lives with:    Do you feel safe going back to the place where you live?  Yes Need for family participation in patient care:  Yes (Comment) (Daughter's actively involved with patient's care and decision making. )  Care giving concerns:  Family concerned about patient being able to care for herself and feel she needs rehab.    Social Worker assessment / plan:  CSW talked with patient's daughter Tawnya CrookKaren Shumake ([place] while PT worked with patient. CSW was stopped in hall by physical therapist asking for a SNF list for family and CSW talked with Ms. Shumake and completed assessment. CSW advised that patient's daughter Reatha Harpsam Montgomery lives with patient [phone]) and [place] [facility]. Patient could not remember the name, stating [facility]? Ms. Shumake indicated that they are interested in [facility]hab for patient and [facility]. She also requested to be contacted regarding facility responses as well as her sister Pam. CSW later contacted by patient's nurse and informed  that [facility].   Employment status:  Retired Database administratornsurance information:  Managed Medicare PT Recommendations:  Skilled Nursing Facility Information / Referral to community resources:  Other (Comment Required) (None needed or requested at this time.)  Patient/Family's Response to care:  Family involved and are requesting rehab for patient.  Patient/Family's Understanding of and Emotional Response to Diagnosis, Current Treatment, and Prognosis:  Daughter's involved and aware of patient's medical condition and needs.  Emotional Assessment Appearance:  Appears stated age Attitude/Demeanor/Rapport:  Other (CSW interacted with patient's daughter while PT worked with patient.) Affect (typically observed):  Appropriate Orientation:  Oriented to Self, Oriented to Place, Oriented to  Time Alcohol / Substance use:  Tobacco Use, Other (Patient former smoker-quit 10/09/14. Patient does not consume alcohol) Psych involvement (Current and /or in the community):  No (Comment)  Discharge Needs  Concerns to be addressed:  Discharge Planning Concerns (Patient will need ST rehab) Readmission within the last 30 days:  No Current discharge risk:  None Barriers to Discharge:  No Barriers Identified   Cristobal GoldmannCrawford, Kayleah Appleyard Bradley, LCSW 02/08/2015, 6:12 PM

## 2015-02-08 NOTE — Progress Notes (Addendum)
Physical Therapy Treatment Patient Details Name: Cindy Briggs MRN: [id number] DOB: 1930-10-13 Today's Date: 02/08/2015    History of Present Illness 81 y.o. female s/p Lt total knee arthroplasty. Past Hx of Rt TKA.    PT Comments    Pt moving well and able to ambulate in hall today with increased ROM. Pt educated for precaution, HEP, gait and use of DME. Pt states dgtr at home but unsure if she can assist sufficiently and other dgtrs present state continued desire for SNF. Will continue to follow to progress mobility and strength.   Follow Up Recommendations  SNF;Supervision for mobility/OOB     Equipment Recommendations  None recommended by PT    Recommendations for Other Services       Precautions / Restrictions Precautions Precautions: Knee Precaution Comments: reveiwed precautions and exercises Required Braces or Orthoses: Knee Immobilizer - Left Knee Immobilizer - Left: On when out of bed or walking Restrictions LLE Weight Bearing: Weight bearing as tolerated    Mobility  Bed Mobility Overal bed mobility: Needs Assistance Bed Mobility: Supine to Sit     Supine to sit: Min guard     General bed mobility comments: cues for sequenc with HOB 10 degrees and use of rail   Transfers Overall transfer level: Needs assistance   Transfers: Sit to/from Stand Sit to Stand: Min assist         General transfer comment: cues for hand placement, sequence and safety with standing from bed and BSC, cues for positioning LLE with transfers  Ambulation/Gait Ambulation/Gait assistance: Min guard Ambulation Distance (Feet): 110 Feet Assistive device: Rolling walker (2 wheeled) Gait Pattern/deviations: Step-through pattern;Decreased stride length;Trunk flexed   Gait velocity interpretation: Below normal speed for age/gender General Gait Details: cues for posture and position in RW   Stairs            Wheelchair Mobility    Modified Rankin (Stroke Patients Only)        Balance                                    Cognition Arousal/Alertness: Awake/alert Behavior During Therapy: WFL for tasks assessed/performed Overall Cognitive Status: Within Functional Limits for tasks assessed       Memory: Decreased short-term memory              Exercises Total Joint Exercises Short Arc Quad: AROM;Left;15 reps;Supine Heel Slides: AAROM;Left;15 reps;Supine Straight Leg Raises: AROM;Left;10 reps;Supine Goniometric ROM: 5-72    General Comments        Pertinent Vitals/Pain Pain Score: 5  Pain Location: left knee Pain Descriptors / Indicators: Aching Pain Intervention(s): Repositioned    Home Living                      Prior Function            PT Goals (current goals can now be found in the care plan section) Progress towards PT goals: Progressing toward goals    Frequency  BID    PT Plan Current plan remains appropriate    Co-evaluation             End of Session Equipment Utilized During Treatment: Left knee immobilizer Activity Tolerance: Patient tolerated treatment well Patient left: in chair;with call bell/phone within reach;with family/visitor present     Time: 4098-11911112-1155 PT Time Calculation (min) (ACUTE ONLY): 43 min  Charges:  $Gait  Training: 8-22 mins $Therapeutic Exercise: 8-22 mins $Therapeutic Activity: 8-22 mins                    G Codes:      Cindy Briggs, Cindy Briggs 02/08/2015, 12:52 PM Cindy Briggs, PT [phone]

## 2015-02-09 LAB — CBC
HCT: 31.4 % — ABNORMAL LOW (ref 36.0–46.0)
Hemoglobin: 9.8 g/dL — ABNORMAL LOW (ref 12.0–15.0)
MCH: 29.5 pg (ref 26.0–34.0)
MCHC: 31.2 g/dL (ref 30.0–36.0)
MCV: 94.6 fL (ref 78.0–100.0)
PLATELETS: 217 10*3/uL (ref 150–400)
RBC: 3.32 MIL/uL — ABNORMAL LOW (ref 3.87–5.11)
RDW: 15.9 % — AB (ref 11.5–15.5)
WBC: 14 10*3/uL — ABNORMAL HIGH (ref 4.0–10.5)

## 2015-02-09 NOTE — Progress Notes (Signed)
Physical Therapy Treatment Patient Details Name: Breelyn Icard MRN: [id number] DOB: [birth year] Today's Date: 02/09/2015    History of Present Illness 82 y.o. female s/p Lt total knee arthroplasty. Past Hx of Rt TKA.    PT Comments    Pt moving well with continued kyphotic posture, decreased mobility and memory. Pt unable to recall family plan for SNF and seems very confused with this. Pt reassured and needs supervision/assist with all mobility. Left knee ROM continues to be very good. Will follow.   Follow Up Recommendations  SNF;Supervision for mobility/OOB     Equipment Recommendations       Recommendations for Other Services       Precautions / Restrictions Precautions Precautions: Knee Precaution Comments: reveiwed precautions and exercises Required Braces or Orthoses:  (PA Eric approved no KI with gait since pt can perform SLR) Restrictions LLE Weight Bearing: Weight bearing as tolerated    Mobility  Bed Mobility         Supine to sit: Supervision     General bed mobility comments: pt able to pivot to EOB with use of rail with supervision for safety  Transfers Overall transfer level: Needs assistance   Transfers: Sit to/from Stand Sit to Stand: Supervision         General transfer comment: cues for hand placement  Ambulation/Gait Ambulation/Gait assistance: Min guard Ambulation Distance (Feet): 160 Feet Assistive device: Rolling walker (2 wheeled) Gait Pattern/deviations: Trunk flexed;Step-through pattern;Decreased stride length   Gait velocity interpretation: Below normal speed for age/gender General Gait Details: cues for posture and position in RW   Stairs            Wheelchair Mobility    Modified Rankin (Stroke Patients Only)       Balance Overall balance assessment: Needs assistance   Sitting balance-Leahy Scale: Good       Standing balance-Leahy Scale: Poor                      Cognition Arousal/Alertness:  Awake/alert Behavior During Therapy: WFL for tasks assessed/performed         Memory: Decreased short-term memory              Exercises Total Joint Exercises Ankle Circles/Pumps: AROM;Left;15 reps;Seated Long Arc Quad: AROM;Seated;Left;20 reps Goniometric ROM: 7-75 General Exercises - Lower Extremity Hip Flexion/Marching: AAROM;Left;10 reps;Seated    General Comments        Pertinent Vitals/Pain Pain Assessment: Faces Faces Pain Scale: Hurts little more Pain Location: left knee Pain Descriptors / Indicators: Aching Pain Intervention(s): Limited activity within patient's tolerance;Repositioned    Home Living                      Prior Function            PT Goals (current goals can now be found in the care plan section) Progress towards PT goals: Goals met and updated - see care plan    Frequency       PT Plan Current plan remains appropriate    Co-evaluation             End of Session   Activity Tolerance: Patient tolerated treatment well Patient left: in chair;with call bell/phone within reach     Time: 0801-0829 PT Time Calculation (min) (ACUTE ONLY): 28 min  Charges:  $Gait Training: 8-22 mins $Therapeutic Exercise: 8-22 mins  G CodesMelford Aase 03-03-15, 9:49 AM Elwyn Reach, [place]

## 2015-02-09 NOTE — Progress Notes (Signed)
OT Cancellation Note  Patient Details Name: Earnstine RegalJean Migliaccio MRN: [id number] DOB: 1931-02-17   Cancelled Treatment:    Reason Eval/Treat Not Completed: OT screened. Pt's current D/C plan is SNF. No apparent immediate acute care OT needs, therefore will defer OT to SNF. If OT eval is needed please call Acute Rehab Dept. at [phone] or text page OT at [phone].    Earlie RavelingStraub, Mead Slane L OTR/L [phone] 02/09/2015, 5:15 PM

## 2015-02-09 NOTE — Progress Notes (Signed)
PATIENT ID: Cindy Briggs  MRN: [id number]  DOB/AGE:  Jan 25, 1931 / 81 y.o.  2 Days Post-Op Procedure(s) (LRB): TOTAL KNEE ARTHROPLASTY (Left)    PROGRESS NOTE Subjective: Patient is alert, oriented, no Nausea, no Vomiting, yes passing gas, no Bowel Movement. Taking PO with small bites today. Denies SOB, Chest or Calf Pain. Using Incentive Spirometer, PAS in place. Ambulate WBAT with pt walking 125 feet with therapy, CPM 0-60 Patient reports pain as mild  .    Objective: Vital signs in last 24 hours: Filed Vitals:   02/08/15 0648 02/08/15 1242 02/08/15 2029 02/09/15 0546  BP: 154/80  109/46 122/61  Pulse:   86 71  Temp: 97.8 F (36.6 C)  98.6 F (37 C) 98.6 F (37 C)  TempSrc: Oral  Oral   Resp:   18 18  Height:      Weight:      SpO2: 100% 99% 98% 98%      Intake/Output from previous day: I/O last 3 completed shifts: In: 1831.7 [P.O.:630; I.V.:1151.7; IV Piggyback:50] Out: 1670 [Urine:1600; Drains:70]   Intake/Output this shift:     LABORATORY DATA:  Recent Labs  02/08/15 0540 02/09/15 0435  WBC 11.6* 14.0*  HGB 10.9* 9.8*  HCT 34.6* 31.4*  PLT 224 217  NA 138  --   K 4.2  --   CL 102  --   CO2 27  --   BUN 29*  --   CREATININE 1.14*  --   GLUCOSE 140*  --   CALCIUM 8.5*  --     Examination: Neurologically intact Neurovascular intact Sensation intact distally Intact pulses distally Dorsiflexion/Plantar flexion intact Incision: scant drainage No cellulitis present Compartment soft}  Assessment:   2 Days Post-Op Procedure(s) (LRB): TOTAL KNEE ARTHROPLASTY (Left) ADDITIONAL DIAGNOSIS: Expected Acute Blood Loss Anemia, COPD  Plan: PT/OT WBAT, CPM 5/hrs day until ROM 0-90 degrees, then D/C CPM DVT Prophylaxis:  SCDx72hrs, ASA 325 mg BID x 2 weeks DISCHARGE PLAN: Skilled Nursing Facility/Rehab per her family. DISCHARGE NEEDS: HHPT, HHRN, CPM, Walker and 3-in-1 comode seat     Jovonta Levit R 02/09/2015, 8:07 AM

## 2015-02-09 NOTE — Progress Notes (Addendum)
CSW received a call from patient's daughter Elita Quickam who states that [facility] [place]. She states that as her primary caregiver- she [place]. [place] [facility]- however- at this time- no bed offers in place yet. Daughter also asked for referral to [facility] [place]. The referral was sent to [facility] as they are not online with [facility].  Weekday CSW will check bed offers in the morning and let patient/daughter know results. Daughter states she will be in her mother's room.  CSW is attempting to complete [facility] but currently unable to log into their website.  Lorri Frederickonna T. Andria RheinCrowder, LCSW [phone]   Patient will require an OT Evaluation due [facility].  CSW notifed Unit Supervisor- Koleen Nimroddrian of above and she stated she will insure that order is placed.  Lorri Frederickonna T. Jaci LazierCrowder, [place]CS[place] [phone]

## 2015-02-10 ENCOUNTER — Encounter (HOSPITAL_COMMUNITY): Payer: Self-pay | Admitting: Orthopedic Surgery

## 2015-02-10 DIAGNOSIS — I10 Essential (primary) hypertension: Secondary | ICD-10-CM | POA: Diagnosis not present

## 2015-02-10 DIAGNOSIS — Z7982 Long term (current) use of aspirin: Secondary | ICD-10-CM | POA: Diagnosis not present

## 2015-02-10 DIAGNOSIS — J45909 Unspecified asthma, uncomplicated: Secondary | ICD-10-CM | POA: Diagnosis not present

## 2015-02-10 DIAGNOSIS — Z96652 Presence of left artificial knee joint: Secondary | ICD-10-CM | POA: Diagnosis not present

## 2015-02-10 DIAGNOSIS — Z471 Aftercare following joint replacement surgery: Secondary | ICD-10-CM | POA: Diagnosis not present

## 2015-02-10 DIAGNOSIS — F329 Major depressive disorder, single episode, unspecified: Secondary | ICD-10-CM | POA: Diagnosis not present

## 2015-02-10 DIAGNOSIS — Z87440 Personal history of urinary (tract) infections: Secondary | ICD-10-CM | POA: Diagnosis not present

## 2015-02-10 DIAGNOSIS — K219 Gastro-esophageal reflux disease without esophagitis: Secondary | ICD-10-CM | POA: Diagnosis not present

## 2015-02-10 DIAGNOSIS — J449 Chronic obstructive pulmonary disease, unspecified: Secondary | ICD-10-CM | POA: Diagnosis not present

## 2015-02-10 DIAGNOSIS — F419 Anxiety disorder, unspecified: Secondary | ICD-10-CM | POA: Diagnosis not present

## 2015-02-10 DIAGNOSIS — M06862 Other specified rheumatoid arthritis, left knee: Secondary | ICD-10-CM | POA: Diagnosis not present

## 2015-02-10 DIAGNOSIS — M47817 Spondylosis without myelopathy or radiculopathy, lumbosacral region: Secondary | ICD-10-CM | POA: Diagnosis not present

## 2015-02-10 LAB — CBC
HCT: 30.3 % — ABNORMAL LOW (ref 36.0–46.0)
Hemoglobin: 9.7 g/dL — ABNORMAL LOW (ref 12.0–15.0)
MCH: 30.4 pg (ref 26.0–34.0)
MCHC: 32 g/dL (ref 30.0–36.0)
MCV: 95 fL (ref 78.0–100.0)
PLATELETS: 224 10*3/uL (ref 150–400)
RBC: 3.19 MIL/uL — AB (ref 3.87–5.11)
RDW: 16.2 % — ABNORMAL HIGH (ref 11.5–15.5)
WBC: 13.2 10*3/uL — ABNORMAL HIGH (ref 4.0–10.5)

## 2015-02-10 NOTE — Progress Notes (Signed)
Physical Therapy Treatment Patient Details Name: Cindy RegalJean Denk MRN: [id number] DOB: [birth year] Today's Date: 02/10/2015    History of Present Illness 81 y.o. female s/p Lt total knee arthroplasty. Past Hx of Rt TKA.    PT Comments    Patient required increased time with all mobility. Once positioned back in bed patient [facility] Increased time for in and out of bed. Cues for safety throughout. RN aware that patient wanted pain meds now after she had declined them earlier this AM.   Follow Up Recommendations  SNF;Supervision for mobility/OOB     Equipment Recommendations  None recommended by PT    Recommendations for Other Services       Precautions / Restrictions Precautions Precautions: Knee Required Braces or Orthoses:  (see previous note/ KI DCd) Restrictions LLE Weight Bearing: Weight bearing as tolerated    Mobility  Bed Mobility Overal bed mobility: Needs Assistance         Sit to supine: Min assist   General bed mobility comments: A to lift LEs back into bed and cues for positioning once into bed.   Transfers Overall transfer level: Needs assistance Equipment used: Rolling walker (2 wheeled)   Sit to Stand: Min assist         General transfer comment: cues for hand placement and min A to power   Ambulation/Gait Ambulation/Gait assistance: Min guard Ambulation Distance (Feet): 30 Feet Assistive device: Rolling walker (2 wheeled) Gait Pattern/deviations: Step-to pattern;Decreased step length - left;Decreased stance time - right;Decreased step length - right;Decreased stance time - left Gait velocity: very decreased Gait velocity interpretation: Below normal speed for age/gender General Gait Details: Patient with very decreased speed and antalgic gait this AM limiting distance. Patient leaning on RW   Stairs            Wheelchair Mobility    Modified Rankin (Stroke Patients Only)       Balance                                    Cognition Arousal/Alertness: Awake/alert Behavior During Therapy: WFL for tasks assessed/performed Overall Cognitive Status: Within Functional Limits for tasks assessed                      Exercises      General Comments        Pertinent Vitals/Pain Pain Score: 5  Pain Location: left knee Pain Descriptors / Indicators: Aching;Sore Pain Intervention(s): Monitored during session;Repositioned    Home Living                      Prior Function            PT Goals (current goals can now be found in the care plan section) Progress towards PT goals: Progressing toward goals    Frequency  7X/week    PT Plan Current plan remains appropriate    Co-evaluation             End of Session   Activity Tolerance: Patient limited by pain Patient left: in bed;with call bell/phone within reach;with family/visitor present     Time: [id number] PT Time Calculation (min) (ACUTE ONLY): 39 min  Charges:  $Gait Training: 23-37 mins $Therapeutic Activity: 8-22 mins                    G  Codes:      Elham Fini Elizabeth 02/10/2015, 9:Fredrich Birks33 AM 02/10/2015 Fredrich Birksobinette, Tazia Illescas Elizabeth PTA [phone] pager [phone] office

## 2015-02-10 NOTE — Discharge Summary (Signed)
Patient ID: Cindy Briggs MRN: [id number] DOB/AGE: 1931/01/30 81 y.o.  Admit date: 02/07/2015 Discharge date: 02/10/2015  Admission Diagnoses:  Principal Problem:   Primary osteoarthritis of left knee   Discharge Diagnoses:  Same  Past Medical History  Diagnosis Date  . Hypertension   . Asthma     as a child  . GERD (gastroesophageal reflux disease)   . Arthritis   . Hepatitis     hepB 30 years ago  . Heart murmur     hx  . Bronchial asthma     at present time started 3 days ago  . Pneumonia 15    hx  . UTI (lower urinary tract infection)     hx  . Depression   . Anxiety     Surgeries: Procedure(s): Left TOTAL KNEE ARTHROPLASTY on 02/07/2015   Consultants:   none  Discharged Condition: Improved  Hospital Course: Cindy Briggs is an 81 y.o. female who was admitted 02/07/2015 for operative treatment ofPrimary osteoarthritis of left knee. Patient has severe unremitting pain that affects sleep, daily activities, and work/hobbies. After pre-op clearance the patient was taken to the operating room on 02/07/2015 and underwent  Procedure(s): TOTAL KNEE ARTHROPLASTY.    Patient was given perioperative antibiotics: Anti-infectives    Start     Dose/Rate Route Frequency Ordered Stop   02/07/15 1445  ceFAZolin (ANCEF) IVPB 2 g/50 mL premix     2 g 100 mL/hr over 30 Minutes Intravenous Every 6 hours 02/07/15 1429 02/07/15 2217   02/07/15 0900  ceFAZolin (ANCEF) IVPB 2 g/50 mL premix     2 g 100 mL/hr over 30 Minutes Intravenous To Surgery 02/06/15 1404 02/07/15 1105       Patient was given sequential compression devices, early ambulation, and chemoprophylaxis to prevent DVT. The patient made slow progress with physical therapy and was felt to be a candidate for skilled nursing facility.  Patient benefited maximally from hospital stay and there were no complications.    Recent vital signs: Patient Vitals for the past 24 hrs:  BP Temp Temp src Pulse Resp SpO2  02/10/15 0449 94/64 mmHg  98.3 F (36.8 C) Oral 80 18 92 %  02/09/15 2120 - - - 80 18 -  02/09/15 2106 (!) 113/50 mmHg 98.1 F (36.7 C) Oral 80 18 96 %  02/09/15 0917 - - - - - 96 %     Recent laboratory studies:  Recent Labs  02/08/15 0540 02/09/15 0435 02/10/15 0510  WBC 11.6* 14.0* 13.2*  HGB 10.9* 9.8* 9.7*  HCT 34.6* 31.4* 30.3*  PLT 224 217 224  NA 138  --   --   K 4.2  --   --   CL 102  --   --   CO2 27  --   --   BUN 29*  --   --   CREATININE 1.14*  --   --   GLUCOSE 140*  --   --   CALCIUM 8.5*  --   --      Discharge Medications:     Medication List    STOP taking these medications        AMBULATORY NON FORMULARY MEDICATION     HYDROcodone-acetaminophen 5-325 MG per tablet  Commonly known as:  NORCO/VICODIN     levofloxacin 750 MG tablet  Commonly known as:  LEVAQUIN     meloxicam 15 MG tablet  Commonly known as:  MOBIC     predniSONE 10 MG (21) Tbpk  tablet  Commonly known as:  STERAPRED UNI-PAK 21 TAB      TAKE these medications        aspirin EC 325 MG tablet  Take 1 tablet (325 mg total) by mouth 2 (two) times daily after a meal. Take x 1 month post op to decrease risk of blood clots.     beta carotene w/minerals tablet  Take 1 tablet by mouth daily. gummies     budesonide-formoterol 160-4.5 MCG/ACT inhaler  Commonly known as:  SYMBICORT  Inhale 1 puff into the lungs 2 (two) times daily.     chlorthalidone 25 MG tablet  Commonly known as:  HYGROTON  Take 1 tablet (25 mg total) by mouth daily.     diphenoxylate-atropine 2.5-0.025 MG per tablet  Commonly known as:  LOMOTIL  One to 2 tablets by mouth 4 times a day as needed for diarrhea.     DULoxetine 60 MG capsule  Commonly known as:  CYMBALTA  TAKE ONE CAPSULE BY MOUTH EVERY DAY.     furosemide 40 MG tablet  Commonly known as:  LASIX  Take 1 tablet (40 mg total) by mouth daily.     guaiFENesin 600 MG 12 hr tablet  Commonly known as:  MUCINEX  Take 600 mg by mouth every 4 (four) hours as needed for  cough or to loosen phlegm.     Ipratropium-Albuterol 20-100 MCG/ACT Aers respimat  Commonly known as:  COMBIVENT  Inhale 1 puff into the lungs every 6 (six) hours as needed for wheezing or shortness of breath.     lansoprazole 30 MG capsule  Commonly known as:  PREVACID  Take 30 mg by mouth daily.     lisinopril 10 MG tablet  Commonly known as:  PRINIVIL,ZESTRIL  TAKE 1 TABLET BY MOUTH EVERY DAY PT NEEDS APPT FOR REFILLS     methocarbamol 500 MG tablet  Commonly known as:  ROBAXIN  Take 1 tablet (500 mg total) by mouth every 8 (eight) hours as needed for muscle spasms.     oxyCODONE-acetaminophen 5-325 MG per tablet  Commonly known as:  PERCOCET/ROXICET  Take 1-2 tablets by mouth every 6 (six) hours as needed for severe pain.     potassium chloride 10 MEQ tablet  Commonly known as:  K-DUR  Take 1 tablet (10 mEq total) by mouth 2 (two) times daily.     pravastatin 20 MG tablet  Commonly known as:  PRAVACHOL  TAKE 1 TABLET BY MOUTH EVERY DAY     pregabalin 300 MG capsule  Commonly known as:  LYRICA  Take 1 capsule (300 mg total) by mouth 2 (two) times daily.     trimethoprim 100 MG tablet  Commonly known as:  TRIMPEX  Take 1 tablet (100 mg total) by mouth every Monday, Wednesday, and Friday. For frequent UTI's     Vitamin D (Ergocalciferol) 50000 UNITS Caps capsule  Commonly known as:  DRISDOL  Take 50,000 Units by mouth every 7 (seven) days. Saturdays        Diagnostic Studies: Dg Facet Jt Inj C/t 3rd Plus Level Left W/fl/ct  01/20/2015   CLINICAL DATA:  Lumbosacral spondylosis without myelopathy. Multilevel lumbar facet arthritis and scoliosis. Little relief with epidural injection. Specific request for bilateral facet injections at L3-L4, L4-L5 and L5-S1. Pain typically reported as 8-9/10.  EXAM: LUMBAR FACET INJECTION LEFT L3-L4, LEFT L4-L5, LEFT L5-S1, RIGHT L3-L4, RIGHT L4-L5, RIGHT L5-S1  FLUOROSCOPY TIME:  2 MINUTES 57 SECONDS  Dose area product:  365.76UGy*m^2   COMPARISON:  MRI 05/22/2014  PROCEDURE: Consent was obtained after a discussion of risks and benefits of the procedure. General risks included bleeding, infection, injury to nerves, blood vessels, and adjacent structures. Specific risks included nondiagnostic and nontherapeutic injection, non target injection, worsening of back pain.  The skin was prepped and draped in the usual sterile fashion. Under fluoroscopic guidance a curved 22 gauge 3 1/2-inch spinal needle was advanced into the facet joint at L3-L4, L4-L5 AND L5-S1 BILATERALLY. Injection of 0.25 mL of Omnipaque 180 was performed which showed intra-articular needle placement. No vascular uptake was observed. Subsequently 20 MG of Depo-Medrol with 1 mL of 1% lidocaine without epinephrine was injected into each facet joint.  The patient tolerated the procedure well and there were no complications. The patient was observed in the holding room for 30 minutes prior to discharge.  IMPRESSION: Technically successful bilateral at L3-L4, L4-L5 and L5-S1 facet joint injections with steroid and anesthetic. The patient could be considered for repeat facet injections in 2-3 months if 50% pain relief from today's injections for 6 weeks.   Electronically Signed   By: Andreas NewportGeoffrey  Lamke M.D.   On: 01/20/2015 14:57   Dg Facet Jt Inj C/t 3rd Plus Level Right W/fl/ct  01/20/2015   CLINICAL DATA:  Lumbosacral spondylosis without myelopathy. Multilevel lumbar facet arthritis and scoliosis. Little relief with epidural injection. Specific request for bilateral facet injections at L3-L4, L4-L5 and L5-S1. Pain typically reported as 8-9/10.  EXAM: LUMBAR FACET INJECTION LEFT L3-L4, LEFT L4-L5, LEFT L5-S1, RIGHT L3-L4, RIGHT L4-L5, RIGHT L5-S1  FLUOROSCOPY TIME:  2 MINUTES 57 SECONDS  Dose area product: 365.76UGy*m^2  COMPARISON:  MRI 05/22/2014  PROCEDURE: Consent was obtained after a discussion of risks and benefits of the procedure. General risks included bleeding, infection,  injury to nerves, blood vessels, and adjacent structures. Specific risks included nondiagnostic and nontherapeutic injection, non target injection, worsening of back pain.  The skin was prepped and draped in the usual sterile fashion. Under fluoroscopic guidance a curved 22 gauge 3 1/2-inch spinal needle was advanced into the facet joint at L3-L4, L4-L5 AND L5-S1 BILATERALLY. Injection of 0.25 mL of Omnipaque 180 was performed which showed intra-articular needle placement. No vascular uptake was observed. Subsequently 20 MG of Depo-Medrol with 1 mL of 1% lidocaine without epinephrine was injected into each facet joint.  The patient tolerated the procedure well and there were no complications. The patient was observed in the holding room for 30 minutes prior to discharge.  IMPRESSION: Technically successful bilateral at L3-L4, L4-L5 and L5-S1 facet joint injections with steroid and anesthetic. The patient could be considered for repeat facet injections in 2-3 months if 50% pain relief from today's injections for 6 weeks.   Electronically Signed   By: Andreas NewportGeoffrey  Lamke M.D.   On: 01/20/2015 14:57   Dg Facet Jt Inj L /s  2nd Level Left W/fl/ct  01/20/2015   CLINICAL DATA:  Lumbosacral spondylosis without myelopathy. Multilevel lumbar facet arthritis and scoliosis. Little relief with epidural injection. Specific request for bilateral facet injections at L3-L4, L4-L5 and L5-S1. Pain typically reported as 8-9/10.  EXAM: LUMBAR FACET INJECTION LEFT L3-L4, LEFT L4-L5, LEFT L5-S1, RIGHT L3-L4, RIGHT L4-L5, RIGHT L5-S1  FLUOROSCOPY TIME:  2 MINUTES 57 SECONDS  Dose area product: 365.76UGy*m^2  COMPARISON:  MRI 05/22/2014  PROCEDURE: Consent was obtained after a discussion of risks and benefits of the procedure. General risks included bleeding, infection, injury to nerves, blood vessels, and adjacent structures. Specific  risks included nondiagnostic and nontherapeutic injection, non target injection, worsening of back pain.   The skin was prepped and draped in the usual sterile fashion. Under fluoroscopic guidance a curved 22 gauge 3 1/2-inch spinal needle was advanced into the facet joint at L3-L4, L4-L5 AND L5-S1 BILATERALLY. Injection of 0.25 mL of Omnipaque 180 was performed which showed intra-articular needle placement. No vascular uptake was observed. Subsequently 20 MG of Depo-Medrol with 1 mL of 1% lidocaine without epinephrine was injected into each facet joint.  The patient tolerated the procedure well and there were no complications. The patient was observed in the holding room for 30 minutes prior to discharge.  IMPRESSION: Technically successful bilateral at L3-L4, L4-L5 and L5-S1 facet joint injections with steroid and anesthetic. The patient could be considered for repeat facet injections in 2-3 months if 50% pain relief from today's injections for 6 weeks.   Electronically Signed   By: Andreas Newport M.D.   On: 01/20/2015 14:57   Dg Facet Jt Inj L /s 2nd Level Right W/fl/ct  01/20/2015   CLINICAL DATA:  Lumbosacral spondylosis without myelopathy. Multilevel lumbar facet arthritis and scoliosis. Little relief with epidural injection. Specific request for bilateral facet injections at L3-L4, L4-L5 and L5-S1. Pain typically reported as 8-9/10.  EXAM: LUMBAR FACET INJECTION LEFT L3-L4, LEFT L4-L5, LEFT L5-S1, RIGHT L3-L4, RIGHT L4-L5, RIGHT L5-S1  FLUOROSCOPY TIME:  2 MINUTES 57 SECONDS  Dose area product: 365.76UGy*m^2  COMPARISON:  MRI 05/22/2014  PROCEDURE: Consent was obtained after a discussion of risks and benefits of the procedure. General risks included bleeding, infection, injury to nerves, blood vessels, and adjacent structures. Specific risks included nondiagnostic and nontherapeutic injection, non target injection, worsening of back pain.  The skin was prepped and draped in the usual sterile fashion. Under fluoroscopic guidance a curved 22 gauge 3 1/2-inch spinal needle was advanced into the facet joint at  L3-L4, L4-L5 AND L5-S1 BILATERALLY. Injection of 0.25 mL of Omnipaque 180 was performed which showed intra-articular needle placement. No vascular uptake was observed. Subsequently 20 MG of Depo-Medrol with 1 mL of 1% lidocaine without epinephrine was injected into each facet joint.  The patient tolerated the procedure well and there were no complications. The patient was observed in the holding room for 30 minutes prior to discharge.  IMPRESSION: Technically successful bilateral at L3-L4, L4-L5 and L5-S1 facet joint injections with steroid and anesthetic. The patient could be considered for repeat facet injections in 2-3 months if 50% pain relief from today's injections for 6 weeks.   Electronically Signed   By: Andreas Newport M.D.   On: 01/20/2015 14:57   Dg Facet Jt Inj L /s Single Level Left W/fl/ct  01/20/2015   CLINICAL DATA:  Lumbosacral spondylosis without myelopathy. Multilevel lumbar facet arthritis and scoliosis. Little relief with epidural injection. Specific request for bilateral facet injections at L3-L4, L4-L5 and L5-S1. Pain typically reported as 8-9/10.  EXAM: LUMBAR FACET INJECTION LEFT L3-L4, LEFT L4-L5, LEFT L5-S1, RIGHT L3-L4, RIGHT L4-L5, RIGHT L5-S1  FLUOROSCOPY TIME:  2 MINUTES 57 SECONDS  Dose area product: 365.76UGy*m^2  COMPARISON:  MRI 05/22/2014  PROCEDURE: Consent was obtained after a discussion of risks and benefits of the procedure. General risks included bleeding, infection, injury to nerves, blood vessels, and adjacent structures. Specific risks included nondiagnostic and nontherapeutic injection, non target injection, worsening of back pain.  The skin was prepped and draped in the usual sterile fashion. Under fluoroscopic guidance a curved 22 gauge 3 1/2-inch spinal needle  was advanced into the facet joint at L3-L4, L4-L5 AND L5-S1 BILATERALLY. Injection of 0.25 mL of Omnipaque 180 was performed which showed intra-articular needle placement. No vascular uptake was observed.  Subsequently 20 MG of Depo-Medrol with 1 mL of 1% lidocaine without epinephrine was injected into each facet joint.  The patient tolerated the procedure well and there were no complications. The patient was observed in the holding room for 30 minutes prior to discharge.  IMPRESSION: Technically successful bilateral at L3-L4, L4-L5 and L5-S1 facet joint injections with steroid and anesthetic. The patient could be considered for repeat facet injections in 2-3 months if 50% pain relief from today's injections for 6 weeks.   Electronically Signed   By: Andreas Newport M.D.   On: 01/20/2015 14:57   Dg Facet Jt Inj L /s Single Level Right W/fl/ct  01/20/2015   CLINICAL DATA:  Lumbosacral spondylosis without myelopathy. Multilevel lumbar facet arthritis and scoliosis. Little relief with epidural injection. Specific request for bilateral facet injections at L3-L4, L4-L5 and L5-S1. Pain typically reported as 8-9/10.  EXAM: LUMBAR FACET INJECTION LEFT L3-L4, LEFT L4-L5, LEFT L5-S1, RIGHT L3-L4, RIGHT L4-L5, RIGHT L5-S1  FLUOROSCOPY TIME:  2 MINUTES 57 SECONDS  Dose area product: 365.76UGy*m^2  COMPARISON:  MRI 05/22/2014  PROCEDURE: Consent was obtained after a discussion of risks and benefits of the procedure. General risks included bleeding, infection, injury to nerves, blood vessels, and adjacent structures. Specific risks included nondiagnostic and nontherapeutic injection, non target injection, worsening of back pain.  The skin was prepped and draped in the usual sterile fashion. Under fluoroscopic guidance a curved 22 gauge 3 1/2-inch spinal needle was advanced into the facet joint at L3-L4, L4-L5 AND L5-S1 BILATERALLY. Injection of 0.25 mL of Omnipaque 180 was performed which showed intra-articular needle placement. No vascular uptake was observed. Subsequently 20 MG of Depo-Medrol with 1 mL of 1% lidocaine without epinephrine was injected into each facet joint.  The patient tolerated the procedure well and there  were no complications. The patient was observed in the holding room for 30 minutes prior to discharge.  IMPRESSION: Technically successful bilateral at L3-L4, L4-L5 and L5-S1 facet joint injections with steroid and anesthetic. The patient could be considered for repeat facet injections in 2-3 months if 50% pain relief from today's injections for 6 weeks.   Electronically Signed   By: Andreas Newport M.D.   On: 01/20/2015 14:57    Disposition: Skilled nursing facility      Discharge Instructions    CPM    Complete by:  As directed   Continuous passive motion machine (CPM):      Use the CPM from 0 to 60 for 8 hours per day.      You may increase by 5-10 per day.  You may break it up into 2 or 3 sessions per day.      Use CPM for 1-2 weeks or until you are told to stop.     Call MD / Call 911    Complete by:  As directed   If you experience chest pain or shortness of breath, CALL 911 and be transported to the hospital emergency room.  If you develope a fever above 101 F, pus (white drainage) or increased drainage or redness at the wound, or calf pain, call your surgeon's office.     Constipation Prevention    Complete by:  As directed   Drink plenty of fluids.  Prune juice may be helpful.  You may  use a stool softener, such as Colace (over the counter) 100 mg twice a day.  Use MiraLax (over the counter) for constipation as needed.     Diet general    Complete by:  As directed      Do not put a pillow under the knee. Place it under the heel.    Complete by:  As directed      Increase activity slowly as tolerated    Complete by:  As directed      Weight bearing as tolerated    Complete by:  As directed   Laterality:  left  Extremity:  Lower           Follow-up Information    Follow up with GRAVES,JOHN L, MD. Schedule an appointment as soon as possible for a visit in 2 weeks.   Specialty:  Orthopedic Surgery   Contact information:   [address] [place]  [address] [phone]        Signed: Matthew Folks 02/10/2015, 8:15 AM

## 2015-02-10 NOTE — Clinical Social Work Placement (Signed)
   CLINICAL SOCIAL WORK PLACEMENT  NOTE  Date:  02/10/2015  Patient Details  Name: Cindy Briggs MRN: [id number] Date of Birth: 16-Feb-1931  Clinical Social Work is seeking post-discharge placement for this patient at the Skilled  Nursing Facility level of care (*CSW will initial, date and re-position this form in  chart as items are completed):  Yes   Patient/family provided with [facility] Clinical Social Work Department's list of facilities offering this level of care within the geographic area requested by the patient (or if unable, by the patient's family).  Yes   Patient/family informed of their freedom to choose among providers that offer the needed level of care, that participate in Medicare, Medicaid or managed care program needed by the patient, have an available bed and are willing to accept the patient.  Yes   Patient/family informed of [facility]'s ownership interest in [facility] and [facility], as well as of the fact that they are under no obligation to receive care at these facilities.  PASRR submitted to EDS on 02/10/15     PASRR number received on 02/10/15     Existing PASRR number confirmed on       FL2 transmitted to all facilities in geographic area requested by pt/family on 02/08/15     FL2 transmitted to all facilities within larger geographic area on 02/08/15     Patient informed that his/her managed care company has contracts with or will negotiate with certain facilities, including the following:        Yes   Patient/family informed of bed offers received.  Patient chooses bed at Other - please specify in the comment section below: ([facility] ([place] [place]     Physician recommends and patient chooses bed at      Patient to be transferred to Other - please specify in the comment section below: ([facility] ([place] on 02/10/15.  Patient to be transferred to facility by [facility]     Patient family notified on 02/10/15 of  transfer.  Name of family member notified:  Rinaldo CloudPamela     PHYSICIAN       Additional Comment:    _______________________________________________ Rod MaeVaughn, Ronette Hank S, LCSW 02/10/2015, 12:18 PM [phone]

## 2015-02-10 NOTE — Discharge Planning (Signed)
Patient to be discharged to [facility]. Patient and patient's daughter updated at bedside.  Facility: [facility] Report: [phone] Transportation: EMS ([facility])  Marcelline DeistEmily Shakeira Rhee, [place] Cell: [phone]       Fax: [phone] Clinical Social Work: Orthopedics [phone]([id number]) and Surgical [phone]([id number])

## 2015-02-10 NOTE — Progress Notes (Signed)
Subjective: 3 Days Post-Op Procedure(s) (LRB): TOTAL KNEE ARTHROPLASTY (Left) Patient reports pain as moderate.  Slow progress with physical therapy. Taking by mouth and voiding okay.  Objective: Vital signs in last 24 hours: Temp:  [98.1 F (36.7 C)-98.3 F (36.8 C)] 98.3 F (36.8 C) (05/09 0449) Pulse Rate:  [80] 80 (05/09 0449) Resp:  [18] 18 (05/09 0449) BP: (94-113)/(50-64) 94/64 mmHg (05/09 0449) SpO2:  [92 %-96 %] 92 % (05/09 0449)  Intake/Output from previous day: 05/08 0701 - 05/09 0700 In: 720 [P.O.:720] Out: -  Intake/Output this shift:     Recent Labs  02/08/15 0540 02/09/15 0435 02/10/15 0510  HGB 10.9* 9.8* 9.7*    Recent Labs  02/09/15 0435 02/10/15 0510  WBC 14.0* 13.2*  RBC 3.32* 3.19*  HCT 31.4* 30.3*  PLT 217 224    Recent Labs  02/08/15 0540  NA 138  K 4.2  CL 102  CO2 27  BUN 29*  CREATININE 1.14*  GLUCOSE 140*  CALCIUM 8.5*   No results for input(s): LABPT, INR in the last 72 hours. Left knee exam: Neurovascular intact Sensation intact distally Intact pulses distally Dorsiflexion/Plantar flexion intact Incision: dressing C/D/I Compartment soft  Assessment/Plan: 3 Days Post-Op Procedure(s) (LRB): TOTAL KNEE ARTHROPLASTY (Left) Plan: Dressing changed by nursing staff. Discharge to SNF Follow-up with Dr. Luiz BlareGraves in 2 weeks.  Rayonna Heldman G 02/10/2015, 8:11 AM

## 2015-02-12 DIAGNOSIS — Z471 Aftercare following joint replacement surgery: Secondary | ICD-10-CM | POA: Diagnosis not present

## 2015-02-12 DIAGNOSIS — F329 Major depressive disorder, single episode, unspecified: Secondary | ICD-10-CM | POA: Diagnosis not present

## 2015-02-12 DIAGNOSIS — I10 Essential (primary) hypertension: Secondary | ICD-10-CM | POA: Diagnosis not present

## 2015-02-12 DIAGNOSIS — J449 Chronic obstructive pulmonary disease, unspecified: Secondary | ICD-10-CM | POA: Diagnosis not present

## 2015-02-12 DIAGNOSIS — K219 Gastro-esophageal reflux disease without esophagitis: Secondary | ICD-10-CM | POA: Diagnosis not present

## 2015-02-17 ENCOUNTER — Other Ambulatory Visit: Payer: Self-pay | Admitting: Sports Medicine

## 2015-02-17 DIAGNOSIS — M47817 Spondylosis without myelopathy or radiculopathy, lumbosacral region: Secondary | ICD-10-CM | POA: Diagnosis not present

## 2015-02-17 DIAGNOSIS — Z7982 Long term (current) use of aspirin: Secondary | ICD-10-CM | POA: Diagnosis not present

## 2015-02-17 DIAGNOSIS — J449 Chronic obstructive pulmonary disease, unspecified: Secondary | ICD-10-CM | POA: Diagnosis not present

## 2015-02-17 DIAGNOSIS — Z87440 Personal history of urinary (tract) infections: Secondary | ICD-10-CM | POA: Diagnosis not present

## 2015-02-17 DIAGNOSIS — F329 Major depressive disorder, single episode, unspecified: Secondary | ICD-10-CM | POA: Diagnosis not present

## 2015-02-17 DIAGNOSIS — F419 Anxiety disorder, unspecified: Secondary | ICD-10-CM | POA: Diagnosis not present

## 2015-02-17 DIAGNOSIS — Z96652 Presence of left artificial knee joint: Secondary | ICD-10-CM | POA: Diagnosis not present

## 2015-02-17 DIAGNOSIS — J45909 Unspecified asthma, uncomplicated: Secondary | ICD-10-CM | POA: Diagnosis not present

## 2015-02-17 DIAGNOSIS — K219 Gastro-esophageal reflux disease without esophagitis: Secondary | ICD-10-CM | POA: Diagnosis not present

## 2015-02-17 DIAGNOSIS — Z792 Long term (current) use of antibiotics: Secondary | ICD-10-CM | POA: Diagnosis not present

## 2015-02-17 DIAGNOSIS — Z471 Aftercare following joint replacement surgery: Secondary | ICD-10-CM | POA: Diagnosis not present

## 2015-02-17 DIAGNOSIS — I10 Essential (primary) hypertension: Secondary | ICD-10-CM | POA: Diagnosis not present

## 2015-02-17 DIAGNOSIS — R32 Unspecified urinary incontinence: Secondary | ICD-10-CM | POA: Diagnosis not present

## 2015-02-18 ENCOUNTER — Telehealth: Payer: Self-pay

## 2015-02-18 DIAGNOSIS — R5383 Other fatigue: Secondary | ICD-10-CM | POA: Diagnosis not present

## 2015-02-18 DIAGNOSIS — Z87440 Personal history of urinary (tract) infections: Secondary | ICD-10-CM | POA: Diagnosis not present

## 2015-02-18 DIAGNOSIS — I959 Hypotension, unspecified: Secondary | ICD-10-CM | POA: Diagnosis not present

## 2015-02-18 DIAGNOSIS — F329 Major depressive disorder, single episode, unspecified: Secondary | ICD-10-CM | POA: Diagnosis not present

## 2015-02-18 DIAGNOSIS — R531 Weakness: Secondary | ICD-10-CM | POA: Diagnosis not present

## 2015-02-18 DIAGNOSIS — Z792 Long term (current) use of antibiotics: Secondary | ICD-10-CM | POA: Diagnosis not present

## 2015-02-18 DIAGNOSIS — Z7951 Long term (current) use of inhaled steroids: Secondary | ICD-10-CM | POA: Diagnosis not present

## 2015-02-18 DIAGNOSIS — D72829 Elevated white blood cell count, unspecified: Secondary | ICD-10-CM | POA: Diagnosis not present

## 2015-02-18 DIAGNOSIS — M25462 Effusion, left knee: Secondary | ICD-10-CM | POA: Diagnosis not present

## 2015-02-18 DIAGNOSIS — Z791 Long term (current) use of non-steroidal anti-inflammatories (NSAID): Secondary | ICD-10-CM | POA: Diagnosis not present

## 2015-02-18 DIAGNOSIS — J45909 Unspecified asthma, uncomplicated: Secondary | ICD-10-CM | POA: Diagnosis not present

## 2015-02-18 DIAGNOSIS — J9811 Atelectasis: Secondary | ICD-10-CM | POA: Diagnosis not present

## 2015-02-18 DIAGNOSIS — Z91040 Latex allergy status: Secondary | ICD-10-CM | POA: Diagnosis not present

## 2015-02-18 DIAGNOSIS — J449 Chronic obstructive pulmonary disease, unspecified: Secondary | ICD-10-CM | POA: Diagnosis not present

## 2015-02-18 DIAGNOSIS — M1712 Unilateral primary osteoarthritis, left knee: Secondary | ICD-10-CM | POA: Diagnosis not present

## 2015-02-18 DIAGNOSIS — F419 Anxiety disorder, unspecified: Secondary | ICD-10-CM | POA: Diagnosis not present

## 2015-02-18 DIAGNOSIS — N179 Acute kidney failure, unspecified: Secondary | ICD-10-CM | POA: Diagnosis not present

## 2015-02-18 DIAGNOSIS — Z96652 Presence of left artificial knee joint: Secondary | ICD-10-CM | POA: Diagnosis not present

## 2015-02-18 DIAGNOSIS — N390 Urinary tract infection, site not specified: Secondary | ICD-10-CM | POA: Diagnosis not present

## 2015-02-18 DIAGNOSIS — Z7982 Long term (current) use of aspirin: Secondary | ICD-10-CM | POA: Diagnosis not present

## 2015-02-18 DIAGNOSIS — R32 Unspecified urinary incontinence: Secondary | ICD-10-CM | POA: Diagnosis not present

## 2015-02-18 DIAGNOSIS — I10 Essential (primary) hypertension: Secondary | ICD-10-CM | POA: Diagnosis not present

## 2015-02-18 DIAGNOSIS — Z79899 Other long term (current) drug therapy: Secondary | ICD-10-CM | POA: Diagnosis not present

## 2015-02-18 DIAGNOSIS — M47817 Spondylosis without myelopathy or radiculopathy, lumbosacral region: Secondary | ICD-10-CM | POA: Diagnosis not present

## 2015-02-18 DIAGNOSIS — N183 Chronic kidney disease, stage 3 (moderate): Secondary | ICD-10-CM | POA: Diagnosis not present

## 2015-02-18 DIAGNOSIS — Z471 Aftercare following joint replacement surgery: Secondary | ICD-10-CM | POA: Diagnosis not present

## 2015-02-18 DIAGNOSIS — K219 Gastro-esophageal reflux disease without esophagitis: Secondary | ICD-10-CM | POA: Diagnosis not present

## 2015-02-18 DIAGNOSIS — Z87891 Personal history of nicotine dependence: Secondary | ICD-10-CM | POA: Diagnosis not present

## 2015-02-18 NOTE — Telephone Encounter (Signed)
Cindy Briggs from [facility] called and reports patient's blood pressure is 84/50 o2 90 and pulse 78. I asked her to recheck blood pressure while I'm on the phone. Recheck of blood pressure was 80/45. I advised them to call 911 for transport to ED.

## 2015-02-18 NOTE — Telephone Encounter (Signed)
Phone does not except incoming calls.

## 2015-02-18 NOTE — Telephone Encounter (Signed)
Was she symptomatic? If asymptomatic then it's not a problem, otherwise they can simply go to urgent care or come see me. Does not sound like an emergency if patient is not having symptoms. Also with COPD we do not want her O2 saturation too high as this decreases respiratory drive.

## 2015-02-19 DIAGNOSIS — N390 Urinary tract infection, site not specified: Secondary | ICD-10-CM | POA: Diagnosis not present

## 2015-02-19 DIAGNOSIS — R5383 Other fatigue: Secondary | ICD-10-CM | POA: Diagnosis not present

## 2015-02-19 DIAGNOSIS — I959 Hypotension, unspecified: Secondary | ICD-10-CM | POA: Diagnosis not present

## 2015-02-19 DIAGNOSIS — D72829 Elevated white blood cell count, unspecified: Secondary | ICD-10-CM | POA: Diagnosis not present

## 2015-02-20 ENCOUNTER — Other Ambulatory Visit: Payer: Self-pay | Admitting: Sports Medicine

## 2015-02-20 DIAGNOSIS — N390 Urinary tract infection, site not specified: Secondary | ICD-10-CM | POA: Diagnosis not present

## 2015-02-20 DIAGNOSIS — D72829 Elevated white blood cell count, unspecified: Secondary | ICD-10-CM | POA: Diagnosis not present

## 2015-02-20 DIAGNOSIS — R5383 Other fatigue: Secondary | ICD-10-CM | POA: Diagnosis not present

## 2015-02-20 DIAGNOSIS — I959 Hypotension, unspecified: Secondary | ICD-10-CM | POA: Diagnosis not present

## 2015-02-21 DIAGNOSIS — I959 Hypotension, unspecified: Secondary | ICD-10-CM | POA: Diagnosis not present

## 2015-02-21 DIAGNOSIS — R5383 Other fatigue: Secondary | ICD-10-CM | POA: Diagnosis not present

## 2015-02-21 DIAGNOSIS — N390 Urinary tract infection, site not specified: Secondary | ICD-10-CM | POA: Diagnosis not present

## 2015-02-21 DIAGNOSIS — D72829 Elevated white blood cell count, unspecified: Secondary | ICD-10-CM | POA: Diagnosis not present

## 2015-02-22 DIAGNOSIS — D72829 Elevated white blood cell count, unspecified: Secondary | ICD-10-CM | POA: Diagnosis not present

## 2015-02-22 DIAGNOSIS — N390 Urinary tract infection, site not specified: Secondary | ICD-10-CM | POA: Diagnosis not present

## 2015-02-22 DIAGNOSIS — R5383 Other fatigue: Secondary | ICD-10-CM | POA: Diagnosis not present

## 2015-02-22 DIAGNOSIS — I959 Hypotension, unspecified: Secondary | ICD-10-CM | POA: Diagnosis not present

## 2015-02-25 DIAGNOSIS — J45909 Unspecified asthma, uncomplicated: Secondary | ICD-10-CM | POA: Diagnosis not present

## 2015-02-25 DIAGNOSIS — Z792 Long term (current) use of antibiotics: Secondary | ICD-10-CM | POA: Diagnosis not present

## 2015-02-25 DIAGNOSIS — J449 Chronic obstructive pulmonary disease, unspecified: Secondary | ICD-10-CM | POA: Diagnosis not present

## 2015-02-25 DIAGNOSIS — Z7982 Long term (current) use of aspirin: Secondary | ICD-10-CM | POA: Diagnosis not present

## 2015-02-25 DIAGNOSIS — F419 Anxiety disorder, unspecified: Secondary | ICD-10-CM | POA: Diagnosis not present

## 2015-02-25 DIAGNOSIS — Z471 Aftercare following joint replacement surgery: Secondary | ICD-10-CM | POA: Diagnosis not present

## 2015-02-25 DIAGNOSIS — I10 Essential (primary) hypertension: Secondary | ICD-10-CM | POA: Diagnosis not present

## 2015-02-25 DIAGNOSIS — Z87440 Personal history of urinary (tract) infections: Secondary | ICD-10-CM | POA: Diagnosis not present

## 2015-02-25 DIAGNOSIS — M47817 Spondylosis without myelopathy or radiculopathy, lumbosacral region: Secondary | ICD-10-CM | POA: Diagnosis not present

## 2015-02-25 DIAGNOSIS — R32 Unspecified urinary incontinence: Secondary | ICD-10-CM | POA: Diagnosis not present

## 2015-02-25 DIAGNOSIS — Z96652 Presence of left artificial knee joint: Secondary | ICD-10-CM | POA: Diagnosis not present

## 2015-02-25 DIAGNOSIS — K219 Gastro-esophageal reflux disease without esophagitis: Secondary | ICD-10-CM | POA: Diagnosis not present

## 2015-02-25 DIAGNOSIS — F329 Major depressive disorder, single episode, unspecified: Secondary | ICD-10-CM | POA: Diagnosis not present

## 2015-02-27 DIAGNOSIS — M1712 Unilateral primary osteoarthritis, left knee: Secondary | ICD-10-CM | POA: Diagnosis not present

## 2015-02-27 DIAGNOSIS — Z96652 Presence of left artificial knee joint: Secondary | ICD-10-CM | POA: Diagnosis not present

## 2015-02-27 DIAGNOSIS — F419 Anxiety disorder, unspecified: Secondary | ICD-10-CM | POA: Diagnosis not present

## 2015-02-27 DIAGNOSIS — F329 Major depressive disorder, single episode, unspecified: Secondary | ICD-10-CM | POA: Diagnosis not present

## 2015-02-27 DIAGNOSIS — M47817 Spondylosis without myelopathy or radiculopathy, lumbosacral region: Secondary | ICD-10-CM | POA: Diagnosis not present

## 2015-02-27 DIAGNOSIS — J45909 Unspecified asthma, uncomplicated: Secondary | ICD-10-CM | POA: Diagnosis not present

## 2015-02-27 DIAGNOSIS — R32 Unspecified urinary incontinence: Secondary | ICD-10-CM | POA: Diagnosis not present

## 2015-02-27 DIAGNOSIS — K219 Gastro-esophageal reflux disease without esophagitis: Secondary | ICD-10-CM | POA: Diagnosis not present

## 2015-02-27 DIAGNOSIS — J449 Chronic obstructive pulmonary disease, unspecified: Secondary | ICD-10-CM | POA: Diagnosis not present

## 2015-02-27 DIAGNOSIS — Z87440 Personal history of urinary (tract) infections: Secondary | ICD-10-CM | POA: Diagnosis not present

## 2015-02-27 DIAGNOSIS — I10 Essential (primary) hypertension: Secondary | ICD-10-CM | POA: Diagnosis not present

## 2015-02-27 DIAGNOSIS — Z7982 Long term (current) use of aspirin: Secondary | ICD-10-CM | POA: Diagnosis not present

## 2015-02-27 DIAGNOSIS — Z792 Long term (current) use of antibiotics: Secondary | ICD-10-CM | POA: Diagnosis not present

## 2015-02-27 DIAGNOSIS — Z471 Aftercare following joint replacement surgery: Secondary | ICD-10-CM | POA: Diagnosis not present

## 2015-02-28 ENCOUNTER — Ambulatory Visit: Payer: Medicare Other | Admitting: Sports Medicine

## 2015-02-28 DIAGNOSIS — Z7982 Long term (current) use of aspirin: Secondary | ICD-10-CM | POA: Diagnosis not present

## 2015-02-28 DIAGNOSIS — Z792 Long term (current) use of antibiotics: Secondary | ICD-10-CM | POA: Diagnosis not present

## 2015-02-28 DIAGNOSIS — F419 Anxiety disorder, unspecified: Secondary | ICD-10-CM | POA: Diagnosis not present

## 2015-02-28 DIAGNOSIS — I10 Essential (primary) hypertension: Secondary | ICD-10-CM | POA: Diagnosis not present

## 2015-02-28 DIAGNOSIS — F329 Major depressive disorder, single episode, unspecified: Secondary | ICD-10-CM | POA: Diagnosis not present

## 2015-02-28 DIAGNOSIS — Z96652 Presence of left artificial knee joint: Secondary | ICD-10-CM | POA: Diagnosis not present

## 2015-02-28 DIAGNOSIS — Z87440 Personal history of urinary (tract) infections: Secondary | ICD-10-CM | POA: Diagnosis not present

## 2015-02-28 DIAGNOSIS — J45909 Unspecified asthma, uncomplicated: Secondary | ICD-10-CM | POA: Diagnosis not present

## 2015-02-28 DIAGNOSIS — Z471 Aftercare following joint replacement surgery: Secondary | ICD-10-CM | POA: Diagnosis not present

## 2015-02-28 DIAGNOSIS — K219 Gastro-esophageal reflux disease without esophagitis: Secondary | ICD-10-CM | POA: Diagnosis not present

## 2015-02-28 DIAGNOSIS — M47817 Spondylosis without myelopathy or radiculopathy, lumbosacral region: Secondary | ICD-10-CM | POA: Diagnosis not present

## 2015-02-28 DIAGNOSIS — R32 Unspecified urinary incontinence: Secondary | ICD-10-CM | POA: Diagnosis not present

## 2015-02-28 DIAGNOSIS — J449 Chronic obstructive pulmonary disease, unspecified: Secondary | ICD-10-CM | POA: Diagnosis not present

## 2015-03-05 ENCOUNTER — Other Ambulatory Visit: Payer: Self-pay | Admitting: Sports Medicine

## 2015-03-05 DIAGNOSIS — M545 Low back pain, unspecified: Secondary | ICD-10-CM

## 2015-03-05 MED ORDER — PREGABALIN 300 MG PO CAPS: 300.0000 mg | ORAL_CAPSULE | Freq: Two times a day (BID) | ORAL | Status: DC

## 2015-03-05 NOTE — Telephone Encounter (Signed)
Pt unaware she missed her f/u appt, we rescheduled it. Needs a refill on her Lyrica, has been out x2 days.

## 2015-03-07 DIAGNOSIS — Z792 Long term (current) use of antibiotics: Secondary | ICD-10-CM | POA: Diagnosis not present

## 2015-03-07 DIAGNOSIS — J449 Chronic obstructive pulmonary disease, unspecified: Secondary | ICD-10-CM | POA: Diagnosis not present

## 2015-03-07 DIAGNOSIS — Z96652 Presence of left artificial knee joint: Secondary | ICD-10-CM | POA: Diagnosis not present

## 2015-03-07 DIAGNOSIS — Z87440 Personal history of urinary (tract) infections: Secondary | ICD-10-CM | POA: Diagnosis not present

## 2015-03-07 DIAGNOSIS — M47817 Spondylosis without myelopathy or radiculopathy, lumbosacral region: Secondary | ICD-10-CM | POA: Diagnosis not present

## 2015-03-07 DIAGNOSIS — R32 Unspecified urinary incontinence: Secondary | ICD-10-CM | POA: Diagnosis not present

## 2015-03-07 DIAGNOSIS — J45909 Unspecified asthma, uncomplicated: Secondary | ICD-10-CM | POA: Diagnosis not present

## 2015-03-07 DIAGNOSIS — I10 Essential (primary) hypertension: Secondary | ICD-10-CM | POA: Diagnosis not present

## 2015-03-07 DIAGNOSIS — F419 Anxiety disorder, unspecified: Secondary | ICD-10-CM | POA: Diagnosis not present

## 2015-03-07 DIAGNOSIS — Z471 Aftercare following joint replacement surgery: Secondary | ICD-10-CM | POA: Diagnosis not present

## 2015-03-07 DIAGNOSIS — F329 Major depressive disorder, single episode, unspecified: Secondary | ICD-10-CM | POA: Diagnosis not present

## 2015-03-07 DIAGNOSIS — Z7982 Long term (current) use of aspirin: Secondary | ICD-10-CM | POA: Diagnosis not present

## 2015-03-07 DIAGNOSIS — K219 Gastro-esophageal reflux disease without esophagitis: Secondary | ICD-10-CM | POA: Diagnosis not present

## 2015-03-10 DIAGNOSIS — Z792 Long term (current) use of antibiotics: Secondary | ICD-10-CM | POA: Diagnosis not present

## 2015-03-10 DIAGNOSIS — K219 Gastro-esophageal reflux disease without esophagitis: Secondary | ICD-10-CM | POA: Diagnosis not present

## 2015-03-10 DIAGNOSIS — M47817 Spondylosis without myelopathy or radiculopathy, lumbosacral region: Secondary | ICD-10-CM | POA: Diagnosis not present

## 2015-03-10 DIAGNOSIS — Z471 Aftercare following joint replacement surgery: Secondary | ICD-10-CM | POA: Diagnosis not present

## 2015-03-10 DIAGNOSIS — J449 Chronic obstructive pulmonary disease, unspecified: Secondary | ICD-10-CM | POA: Diagnosis not present

## 2015-03-10 DIAGNOSIS — Z7982 Long term (current) use of aspirin: Secondary | ICD-10-CM | POA: Diagnosis not present

## 2015-03-10 DIAGNOSIS — F419 Anxiety disorder, unspecified: Secondary | ICD-10-CM | POA: Diagnosis not present

## 2015-03-10 DIAGNOSIS — F329 Major depressive disorder, single episode, unspecified: Secondary | ICD-10-CM | POA: Diagnosis not present

## 2015-03-10 DIAGNOSIS — Z87440 Personal history of urinary (tract) infections: Secondary | ICD-10-CM | POA: Diagnosis not present

## 2015-03-10 DIAGNOSIS — Z96652 Presence of left artificial knee joint: Secondary | ICD-10-CM | POA: Diagnosis not present

## 2015-03-10 DIAGNOSIS — R32 Unspecified urinary incontinence: Secondary | ICD-10-CM | POA: Diagnosis not present

## 2015-03-10 DIAGNOSIS — I10 Essential (primary) hypertension: Secondary | ICD-10-CM | POA: Diagnosis not present

## 2015-03-10 DIAGNOSIS — J45909 Unspecified asthma, uncomplicated: Secondary | ICD-10-CM | POA: Diagnosis not present

## 2015-03-12 DIAGNOSIS — Z7982 Long term (current) use of aspirin: Secondary | ICD-10-CM | POA: Diagnosis not present

## 2015-03-12 DIAGNOSIS — J45909 Unspecified asthma, uncomplicated: Secondary | ICD-10-CM | POA: Diagnosis not present

## 2015-03-12 DIAGNOSIS — F419 Anxiety disorder, unspecified: Secondary | ICD-10-CM | POA: Diagnosis not present

## 2015-03-12 DIAGNOSIS — M47817 Spondylosis without myelopathy or radiculopathy, lumbosacral region: Secondary | ICD-10-CM | POA: Diagnosis not present

## 2015-03-12 DIAGNOSIS — Z96652 Presence of left artificial knee joint: Secondary | ICD-10-CM | POA: Diagnosis not present

## 2015-03-12 DIAGNOSIS — Z471 Aftercare following joint replacement surgery: Secondary | ICD-10-CM | POA: Diagnosis not present

## 2015-03-12 DIAGNOSIS — R32 Unspecified urinary incontinence: Secondary | ICD-10-CM | POA: Diagnosis not present

## 2015-03-12 DIAGNOSIS — I10 Essential (primary) hypertension: Secondary | ICD-10-CM | POA: Diagnosis not present

## 2015-03-12 DIAGNOSIS — Z87440 Personal history of urinary (tract) infections: Secondary | ICD-10-CM | POA: Diagnosis not present

## 2015-03-12 DIAGNOSIS — J449 Chronic obstructive pulmonary disease, unspecified: Secondary | ICD-10-CM | POA: Diagnosis not present

## 2015-03-12 DIAGNOSIS — F329 Major depressive disorder, single episode, unspecified: Secondary | ICD-10-CM | POA: Diagnosis not present

## 2015-03-12 DIAGNOSIS — Z792 Long term (current) use of antibiotics: Secondary | ICD-10-CM | POA: Diagnosis not present

## 2015-03-12 DIAGNOSIS — K219 Gastro-esophageal reflux disease without esophagitis: Secondary | ICD-10-CM | POA: Diagnosis not present

## 2015-03-13 ENCOUNTER — Encounter: Payer: Self-pay | Admitting: Sports Medicine

## 2015-03-13 ENCOUNTER — Ambulatory Visit (INDEPENDENT_AMBULATORY_CARE_PROVIDER_SITE_OTHER): Payer: Medicare Other | Admitting: Sports Medicine

## 2015-03-13 VITALS — BP 121/74 | HR 92 | Ht 66.0 in | Wt 184.0 lb

## 2015-03-13 DIAGNOSIS — N390 Urinary tract infection, site not specified: Secondary | ICD-10-CM

## 2015-03-13 DIAGNOSIS — Z96652 Presence of left artificial knee joint: Secondary | ICD-10-CM

## 2015-03-13 DIAGNOSIS — Z87440 Personal history of urinary (tract) infections: Secondary | ICD-10-CM

## 2015-03-13 LAB — POCT URINALYSIS DIPSTICK
Bilirubin, UA: NEGATIVE
Blood, UA: NEGATIVE
Glucose, UA: NEGATIVE
Ketones, UA: NEGATIVE
Nitrite, UA: POSITIVE
Protein, UA: NEGATIVE
Spec Grav, UA: 1.015
Urobilinogen, UA: 0.2
pH, UA: 5

## 2015-03-13 MED ORDER — CEPHALEXIN 500 MG PO CAPS: 500.0000 mg | ORAL_CAPSULE | Freq: Two times a day (BID) | ORAL | Status: DC

## 2015-03-13 MED ORDER — CEPHALEXIN 250 MG PO CAPS: 250.0000 mg | ORAL_CAPSULE | Freq: Every day | ORAL | Status: DC

## 2015-03-13 MED ORDER — OXYCODONE-ACETAMINOPHEN 5-325 MG PO TABS: 1.0000 | ORAL_TABLET | Freq: Two times a day (BID) | ORAL | Status: DC | PRN

## 2015-03-13 NOTE — Assessment & Plan Note (Signed)
Keflex 500 mg twice a day for 7 days, then switching to 250 mg of Keflex daily, discontinue trimethoprim.

## 2015-03-13 NOTE — Progress Notes (Signed)
  Subjective:    CC: UTI  HPI: This is a pleasant 81 year old female, she is doing extremely well post arthroplasty, she did develop a small pressure ulcer on the back of her left heel. Overall it is closed and only minimally tender.  Recurrent UTI: Has been on trimethoprim 3 times per week, unfortunately with new onset dysuria, frequency, urgency and burning without costovertebral angle pain or constitutional symptoms.  Past medical history, Surgical history, Family history not pertinant except as noted below, Social history, Allergies, and medications have been entered into the medical record, reviewed, and no changes needed.   Review of Systems: No fevers, chills, night sweats, weight loss, chest pain, or shortness of breath.   Objective:    General: Well Developed, well nourished, and in no acute distress.  Neuro: Alert and oriented x3, extra-ocular muscles intact, sensation grossly intact.  HEENT: Normocephalic, atraumatic, pupils equal round reactive to light, neck supple, no masses, no lymphadenopathy, thyroid nonpalpable.  Skin: Warm and dry, no rashes. Cardiac: Regular rate and rhythm, no murmurs rubs or gallops, no lower extremity edema.  Respiratory: Clear to auscultation bilaterally. Not using accessory muscles, speaking in full sentences. Abdomen: Soft, nontender, nondistended, normal bowel sounds, no palpable masses.  Urinalysis reviewed and shows positive leukocytes and nitrites.  Impression and Recommendations:

## 2015-03-13 NOTE — Assessment & Plan Note (Addendum)
Doing extremely well post arthroplasty with excellent range of motion and a well-healed surgical scar. She did develop a pressure ulcer on her posterolateral heel, skin is intact, she simply needs to avoid continuous pressure, simply a Band-Aid can be applied at this point, stage II. Does need a refill on narcotic pain medication, I will give her a week's supply this until she can get more from Dr. Luiz Blare.

## 2015-03-17 DIAGNOSIS — Z471 Aftercare following joint replacement surgery: Secondary | ICD-10-CM | POA: Diagnosis not present

## 2015-03-17 DIAGNOSIS — M47817 Spondylosis without myelopathy or radiculopathy, lumbosacral region: Secondary | ICD-10-CM | POA: Diagnosis not present

## 2015-03-17 DIAGNOSIS — Z96652 Presence of left artificial knee joint: Secondary | ICD-10-CM | POA: Diagnosis not present

## 2015-03-18 ENCOUNTER — Other Ambulatory Visit: Payer: Self-pay | Admitting: Sports Medicine

## 2015-03-18 ENCOUNTER — Telehealth: Payer: Self-pay | Admitting: *Deleted

## 2015-03-18 NOTE — Telephone Encounter (Signed)
Yes that's not a problem, please give her 15mg , 1 tab PO daily with breakfast, #90 refills 3, Preesh Bamby! :-)

## 2015-03-18 NOTE — Telephone Encounter (Signed)
Pt's daughter left vm stating that the pt has been trying to get meloxicam filled for 2 weeks now.  I see it in her historical meds but was still unsure. Please advise.

## 2015-03-19 ENCOUNTER — Other Ambulatory Visit: Payer: Self-pay | Admitting: *Deleted

## 2015-03-19 MED ORDER — MELOXICAM 15 MG PO TABS: 15.0000 mg | ORAL_TABLET | Freq: Every day | ORAL | Status: DC

## 2015-03-19 NOTE — Telephone Encounter (Signed)
Done

## 2015-03-21 DIAGNOSIS — R32 Unspecified urinary incontinence: Secondary | ICD-10-CM | POA: Diagnosis not present

## 2015-03-21 DIAGNOSIS — Z471 Aftercare following joint replacement surgery: Secondary | ICD-10-CM | POA: Diagnosis not present

## 2015-03-21 DIAGNOSIS — F419 Anxiety disorder, unspecified: Secondary | ICD-10-CM | POA: Diagnosis not present

## 2015-03-21 DIAGNOSIS — Z96652 Presence of left artificial knee joint: Secondary | ICD-10-CM | POA: Diagnosis not present

## 2015-03-21 DIAGNOSIS — K219 Gastro-esophageal reflux disease without esophagitis: Secondary | ICD-10-CM | POA: Diagnosis not present

## 2015-03-21 DIAGNOSIS — Z87440 Personal history of urinary (tract) infections: Secondary | ICD-10-CM | POA: Diagnosis not present

## 2015-03-21 DIAGNOSIS — J449 Chronic obstructive pulmonary disease, unspecified: Secondary | ICD-10-CM | POA: Diagnosis not present

## 2015-03-21 DIAGNOSIS — Z7982 Long term (current) use of aspirin: Secondary | ICD-10-CM | POA: Diagnosis not present

## 2015-03-21 DIAGNOSIS — J45909 Unspecified asthma, uncomplicated: Secondary | ICD-10-CM | POA: Diagnosis not present

## 2015-03-21 DIAGNOSIS — M47817 Spondylosis without myelopathy or radiculopathy, lumbosacral region: Secondary | ICD-10-CM | POA: Diagnosis not present

## 2015-03-21 DIAGNOSIS — I10 Essential (primary) hypertension: Secondary | ICD-10-CM | POA: Diagnosis not present

## 2015-03-21 DIAGNOSIS — Z792 Long term (current) use of antibiotics: Secondary | ICD-10-CM | POA: Diagnosis not present

## 2015-03-21 DIAGNOSIS — F329 Major depressive disorder, single episode, unspecified: Secondary | ICD-10-CM | POA: Diagnosis not present

## 2015-03-24 ENCOUNTER — Other Ambulatory Visit: Payer: Self-pay | Admitting: Sports Medicine

## 2015-03-25 DIAGNOSIS — Z87440 Personal history of urinary (tract) infections: Secondary | ICD-10-CM | POA: Diagnosis not present

## 2015-03-25 DIAGNOSIS — F419 Anxiety disorder, unspecified: Secondary | ICD-10-CM | POA: Diagnosis not present

## 2015-03-25 DIAGNOSIS — J449 Chronic obstructive pulmonary disease, unspecified: Secondary | ICD-10-CM | POA: Diagnosis not present

## 2015-03-25 DIAGNOSIS — Z96652 Presence of left artificial knee joint: Secondary | ICD-10-CM | POA: Diagnosis not present

## 2015-03-25 DIAGNOSIS — I10 Essential (primary) hypertension: Secondary | ICD-10-CM | POA: Diagnosis not present

## 2015-03-25 DIAGNOSIS — J45909 Unspecified asthma, uncomplicated: Secondary | ICD-10-CM | POA: Diagnosis not present

## 2015-03-25 DIAGNOSIS — K219 Gastro-esophageal reflux disease without esophagitis: Secondary | ICD-10-CM | POA: Diagnosis not present

## 2015-03-25 DIAGNOSIS — F329 Major depressive disorder, single episode, unspecified: Secondary | ICD-10-CM | POA: Diagnosis not present

## 2015-03-25 DIAGNOSIS — Z471 Aftercare following joint replacement surgery: Secondary | ICD-10-CM | POA: Diagnosis not present

## 2015-03-25 DIAGNOSIS — Z792 Long term (current) use of antibiotics: Secondary | ICD-10-CM | POA: Diagnosis not present

## 2015-03-25 DIAGNOSIS — M47817 Spondylosis without myelopathy or radiculopathy, lumbosacral region: Secondary | ICD-10-CM | POA: Diagnosis not present

## 2015-03-25 DIAGNOSIS — R32 Unspecified urinary incontinence: Secondary | ICD-10-CM | POA: Diagnosis not present

## 2015-03-25 DIAGNOSIS — Z7982 Long term (current) use of aspirin: Secondary | ICD-10-CM | POA: Diagnosis not present

## 2015-04-16 DIAGNOSIS — Z471 Aftercare following joint replacement surgery: Secondary | ICD-10-CM | POA: Diagnosis not present

## 2015-04-16 DIAGNOSIS — M47817 Spondylosis without myelopathy or radiculopathy, lumbosacral region: Secondary | ICD-10-CM | POA: Diagnosis not present

## 2015-04-16 DIAGNOSIS — Z96652 Presence of left artificial knee joint: Secondary | ICD-10-CM | POA: Diagnosis not present

## 2015-04-21 ENCOUNTER — Other Ambulatory Visit: Payer: Self-pay | Admitting: Sports Medicine

## 2015-04-21 MED ORDER — PREGABALIN 300 MG PO CAPS: 300.0000 mg | ORAL_CAPSULE | Freq: Two times a day (BID) | ORAL | Status: DC

## 2015-05-14 ENCOUNTER — Other Ambulatory Visit: Payer: Self-pay | Admitting: Sports Medicine

## 2015-05-14 ENCOUNTER — Telehealth: Payer: Self-pay

## 2015-05-14 MED ORDER — GABAPENTIN 300 MG PO CAPS: ORAL_CAPSULE | ORAL | Status: DC

## 2015-05-14 NOTE — Telephone Encounter (Signed)
The cost of lyrica is to much. She would like to switch to gabapentin. Please advise.

## 2015-05-14 NOTE — Telephone Encounter (Signed)
Patient's daughter advised.  

## 2015-05-14 NOTE — Telephone Encounter (Signed)
Starting up titration of gabapentin.

## 2015-05-17 DIAGNOSIS — Z96652 Presence of left artificial knee joint: Secondary | ICD-10-CM | POA: Diagnosis not present

## 2015-05-17 DIAGNOSIS — Z471 Aftercare following joint replacement surgery: Secondary | ICD-10-CM | POA: Diagnosis not present

## 2015-05-17 DIAGNOSIS — M47817 Spondylosis without myelopathy or radiculopathy, lumbosacral region: Secondary | ICD-10-CM | POA: Diagnosis not present

## 2015-05-19 ENCOUNTER — Telehealth: Payer: Self-pay

## 2015-05-19 DIAGNOSIS — R197 Diarrhea, unspecified: Secondary | ICD-10-CM

## 2015-05-19 NOTE — Telephone Encounter (Signed)
Let's go ahead and get stool studies, I will place orders.

## 2015-05-19 NOTE — Assessment & Plan Note (Signed)
Persistent diarrhea for one month, no constitutional symptoms, no travel.

## 2015-05-19 NOTE — Telephone Encounter (Signed)
Patient complains of diarrhea 3 times daily for a month. She has taken imodium daily without relief. Please advise your recommendations.

## 2015-05-20 NOTE — Telephone Encounter (Signed)
Left message advising of recommendations.  

## 2015-05-28 ENCOUNTER — Other Ambulatory Visit: Payer: Self-pay | Admitting: Sports Medicine

## 2015-06-13 ENCOUNTER — Ambulatory Visit: Payer: Medicare Other | Admitting: Sports Medicine

## 2015-06-16 ENCOUNTER — Ambulatory Visit: Payer: Medicare Other | Admitting: Sports Medicine

## 2015-07-03 ENCOUNTER — Ambulatory Visit: Payer: Medicare Other | Admitting: Sports Medicine

## 2015-07-03 ENCOUNTER — Encounter: Payer: Self-pay | Admitting: Sports Medicine

## 2015-07-07 ENCOUNTER — Other Ambulatory Visit: Payer: Self-pay | Admitting: Sports Medicine

## 2015-07-17 DIAGNOSIS — M47817 Spondylosis without myelopathy or radiculopathy, lumbosacral region: Secondary | ICD-10-CM | POA: Diagnosis not present

## 2015-07-17 DIAGNOSIS — Z471 Aftercare following joint replacement surgery: Secondary | ICD-10-CM | POA: Diagnosis not present

## 2015-07-17 DIAGNOSIS — Z96652 Presence of left artificial knee joint: Secondary | ICD-10-CM | POA: Diagnosis not present

## 2015-07-30 ENCOUNTER — Other Ambulatory Visit: Payer: Self-pay | Admitting: Sports Medicine

## 2015-07-30 DIAGNOSIS — Z23 Encounter for immunization: Secondary | ICD-10-CM | POA: Diagnosis not present

## 2015-07-30 MED ORDER — PRAVASTATIN SODIUM 20 MG PO TABS: 20.0000 mg | ORAL_TABLET | Freq: Every day | ORAL | Status: DC

## 2015-07-30 NOTE — Telephone Encounter (Signed)
PATIENT REQUEST REFILL FOR PRAVASTATIN 20 MG. #30 0 REFILLS SENT TO PHARMACY. PATIENT ADVISED THAT APPOINTMENT IS NEEDED FOR FURTHER REFILLS. Andee Chivers,CMA

## 2015-08-17 DIAGNOSIS — Z471 Aftercare following joint replacement surgery: Secondary | ICD-10-CM | POA: Diagnosis not present

## 2015-08-17 DIAGNOSIS — M47817 Spondylosis without myelopathy or radiculopathy, lumbosacral region: Secondary | ICD-10-CM | POA: Diagnosis not present

## 2015-08-17 DIAGNOSIS — Z96652 Presence of left artificial knee joint: Secondary | ICD-10-CM | POA: Diagnosis not present

## 2015-08-23 ENCOUNTER — Other Ambulatory Visit: Payer: Self-pay | Admitting: Sports Medicine

## 2015-09-16 DIAGNOSIS — Z96652 Presence of left artificial knee joint: Secondary | ICD-10-CM | POA: Diagnosis not present

## 2015-09-16 DIAGNOSIS — Z471 Aftercare following joint replacement surgery: Secondary | ICD-10-CM | POA: Diagnosis not present

## 2015-09-16 DIAGNOSIS — M47817 Spondylosis without myelopathy or radiculopathy, lumbosacral region: Secondary | ICD-10-CM | POA: Diagnosis not present

## 2015-09-19 ENCOUNTER — Other Ambulatory Visit: Payer: Self-pay | Admitting: Sports Medicine

## 2015-09-22 ENCOUNTER — Other Ambulatory Visit: Payer: Self-pay | Admitting: Sports Medicine

## 2015-10-16 DIAGNOSIS — E559 Vitamin D deficiency, unspecified: Secondary | ICD-10-CM | POA: Diagnosis not present

## 2015-10-16 DIAGNOSIS — Z6830 Body mass index (BMI) 30.0-30.9, adult: Secondary | ICD-10-CM | POA: Diagnosis not present

## 2015-10-16 DIAGNOSIS — Z79899 Other long term (current) drug therapy: Secondary | ICD-10-CM | POA: Diagnosis not present

## 2015-10-16 DIAGNOSIS — E785 Hyperlipidemia, unspecified: Secondary | ICD-10-CM | POA: Diagnosis not present

## 2015-10-16 DIAGNOSIS — M1990 Unspecified osteoarthritis, unspecified site: Secondary | ICD-10-CM | POA: Diagnosis not present

## 2015-10-16 DIAGNOSIS — J069 Acute upper respiratory infection, unspecified: Secondary | ICD-10-CM | POA: Diagnosis not present

## 2015-10-16 DIAGNOSIS — Z1389 Encounter for screening for other disorder: Secondary | ICD-10-CM | POA: Diagnosis not present

## 2015-10-16 DIAGNOSIS — N183 Chronic kidney disease, stage 3 (moderate): Secondary | ICD-10-CM | POA: Diagnosis not present

## 2015-10-26 ENCOUNTER — Other Ambulatory Visit: Payer: Self-pay | Admitting: Sports Medicine

## 2015-12-05 ENCOUNTER — Other Ambulatory Visit: Payer: Self-pay | Admitting: Sports Medicine

## 2015-12-09 DIAGNOSIS — Z79899 Other long term (current) drug therapy: Secondary | ICD-10-CM | POA: Diagnosis not present

## 2015-12-09 DIAGNOSIS — J449 Chronic obstructive pulmonary disease, unspecified: Secondary | ICD-10-CM | POA: Diagnosis not present

## 2015-12-09 DIAGNOSIS — M1990 Unspecified osteoarthritis, unspecified site: Secondary | ICD-10-CM | POA: Diagnosis not present

## 2015-12-09 DIAGNOSIS — Z9181 History of falling: Secondary | ICD-10-CM | POA: Diagnosis not present

## 2015-12-09 DIAGNOSIS — J309 Allergic rhinitis, unspecified: Secondary | ICD-10-CM | POA: Diagnosis not present

## 2015-12-09 DIAGNOSIS — Z6831 Body mass index (BMI) 31.0-31.9, adult: Secondary | ICD-10-CM | POA: Diagnosis not present

## 2015-12-20 ENCOUNTER — Other Ambulatory Visit: Payer: Self-pay | Admitting: Sports Medicine

## 2016-01-21 DIAGNOSIS — E785 Hyperlipidemia, unspecified: Secondary | ICD-10-CM | POA: Diagnosis not present

## 2016-01-21 DIAGNOSIS — K219 Gastro-esophageal reflux disease without esophagitis: Secondary | ICD-10-CM | POA: Diagnosis not present

## 2016-01-21 DIAGNOSIS — E559 Vitamin D deficiency, unspecified: Secondary | ICD-10-CM | POA: Diagnosis not present

## 2016-01-21 DIAGNOSIS — J449 Chronic obstructive pulmonary disease, unspecified: Secondary | ICD-10-CM | POA: Diagnosis not present

## 2016-01-21 DIAGNOSIS — M5430 Sciatica, unspecified side: Secondary | ICD-10-CM | POA: Diagnosis not present

## 2016-01-21 DIAGNOSIS — M1990 Unspecified osteoarthritis, unspecified site: Secondary | ICD-10-CM | POA: Diagnosis not present

## 2016-01-21 DIAGNOSIS — F329 Major depressive disorder, single episode, unspecified: Secondary | ICD-10-CM | POA: Diagnosis not present

## 2016-01-21 DIAGNOSIS — Z6830 Body mass index (BMI) 30.0-30.9, adult: Secondary | ICD-10-CM | POA: Diagnosis not present

## 2016-01-21 DIAGNOSIS — Z79899 Other long term (current) drug therapy: Secondary | ICD-10-CM | POA: Diagnosis not present

## 2016-01-28 ENCOUNTER — Other Ambulatory Visit: Payer: Self-pay

## 2016-01-28 DIAGNOSIS — N390 Urinary tract infection, site not specified: Secondary | ICD-10-CM

## 2016-02-02 IMAGING — CT CT CHEST W/O CM
3 of 4 series · 16 of 30 positions shown, 17 images · non-contrast
Comparison: Radiograph October 18, 2014.

CLINICAL DATA: Abnormal chest x-ray, pulmonary nodule.

EXAM:
CT CHEST WITHOUT CONTRAST
TECHNIQUE: Multidetector CT imaging of the chest was performed following the
standard protocol without IV contrast..

[Series 3: chest w/o · axial · non-contrast · 0.70mm/px · z∈[-179,-14]mm · 4 of 56 slices shown, 5 images]
[im 12/56  mediastinal]
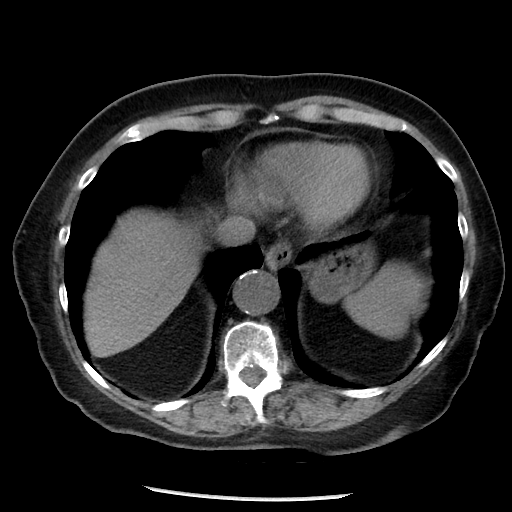
[im 12/56  lung]
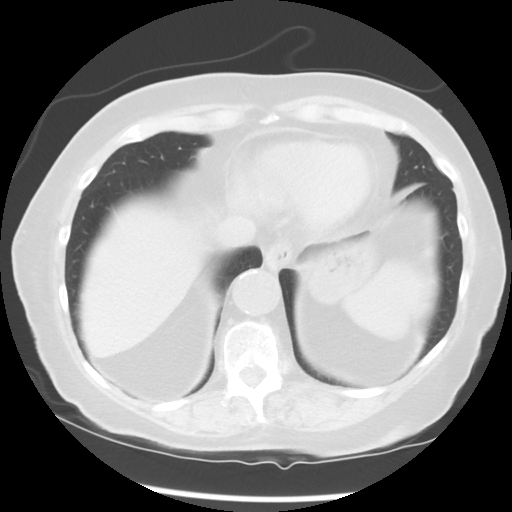
[im 23/56  lung]
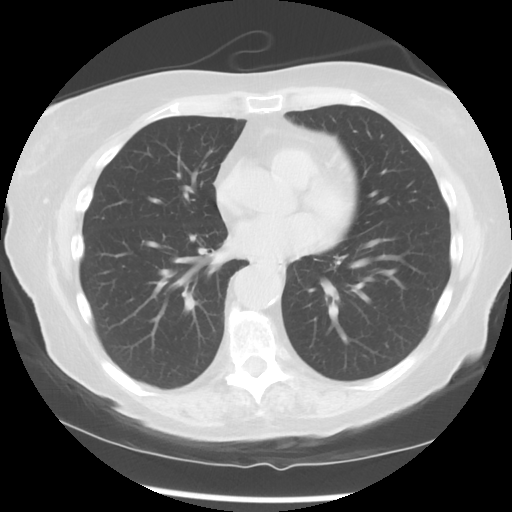
[im 34/56  lung]
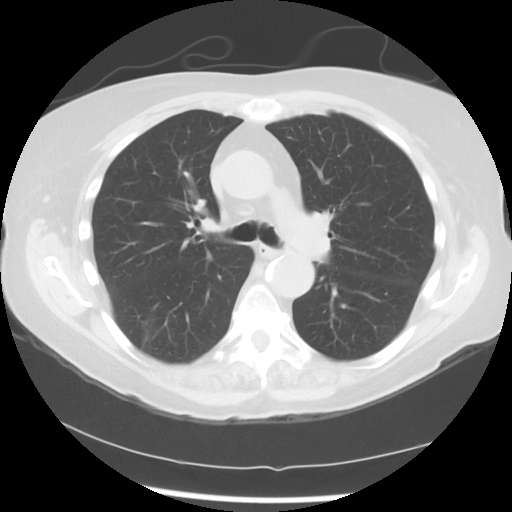
[im 45/56  lung]
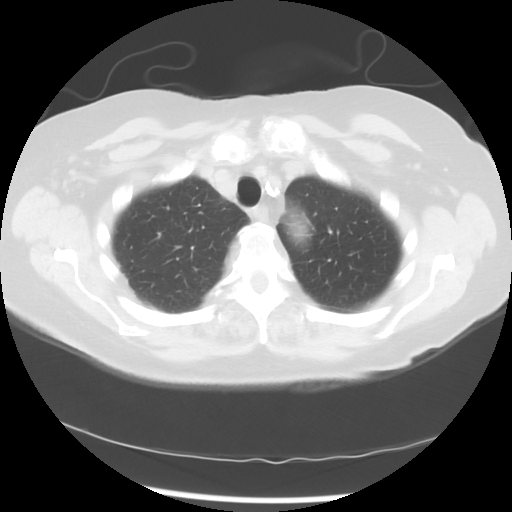

[Series 4: lung windows · axial · 0.70mm/px · z∈[-179,-14]mm · 4 of 56 slices shown]
[im 12/56  lung]
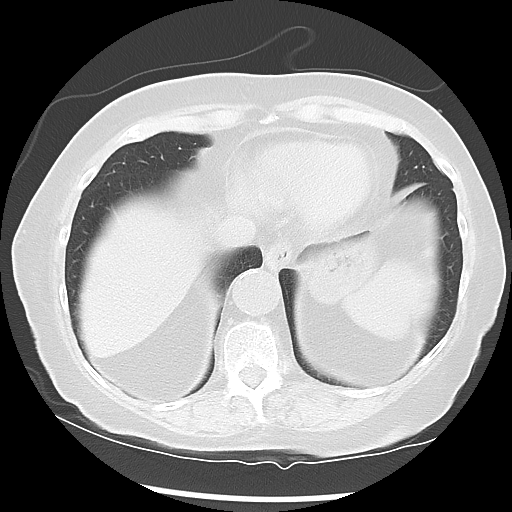
[im 23/56  lung]
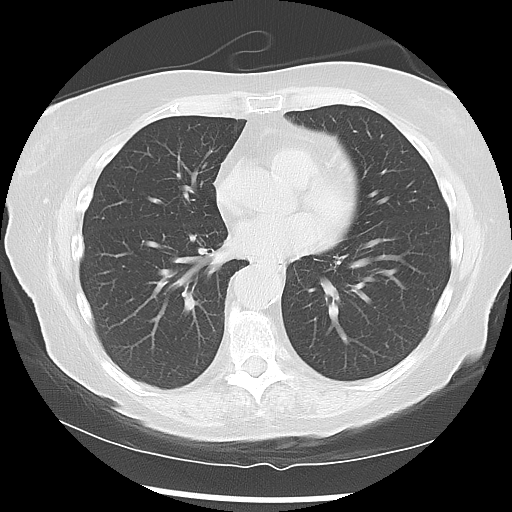
[im 34/56  lung]
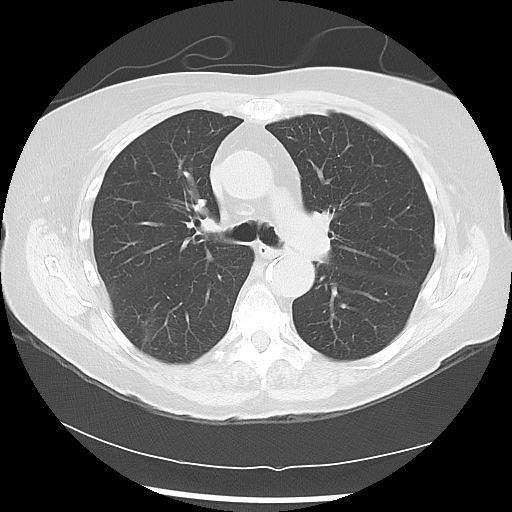
[im 45/56  lung]
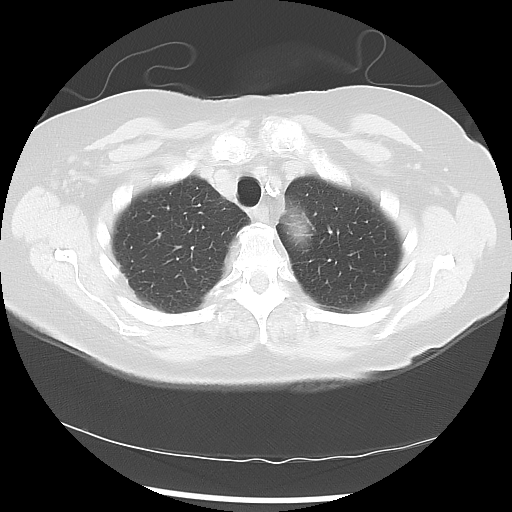

[Series 602: sagittal body · sagittal · 0.70mm/px · 8 of 145 slices shown]
[im 10/145  mediastinal]
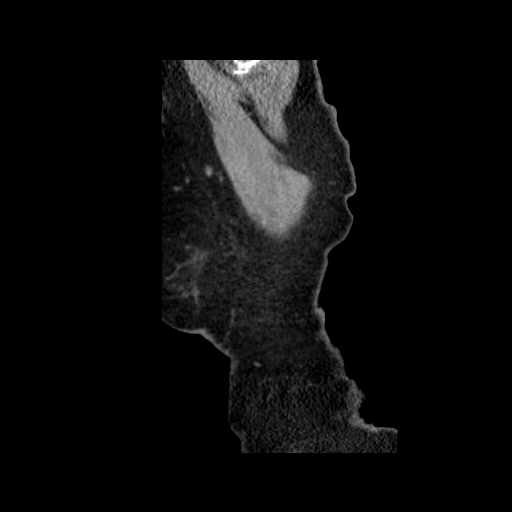
[im 29/145  mediastinal]
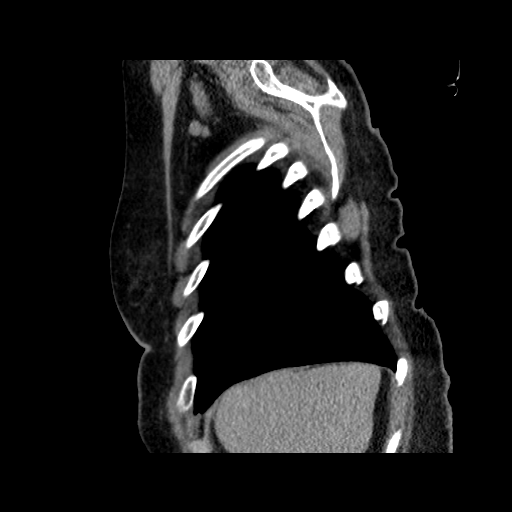
[im 49/145  mediastinal]
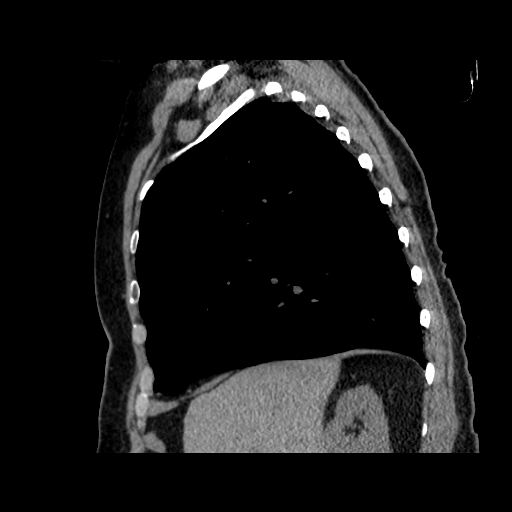
[im 68/145  mediastinal]
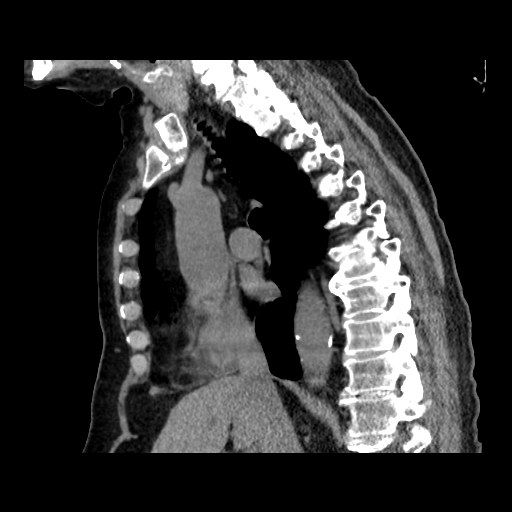
[im 77/145  mediastinal]
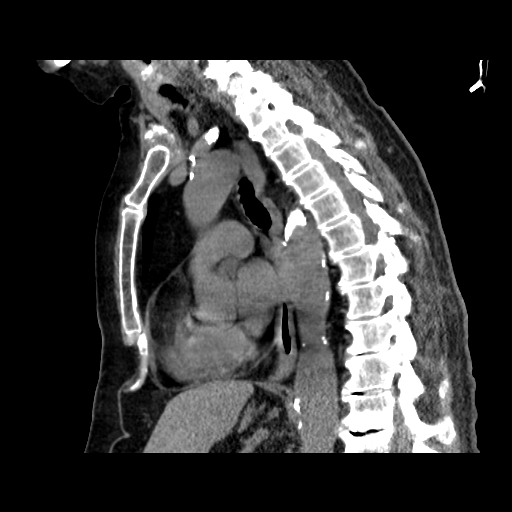
[im 97/145  mediastinal]
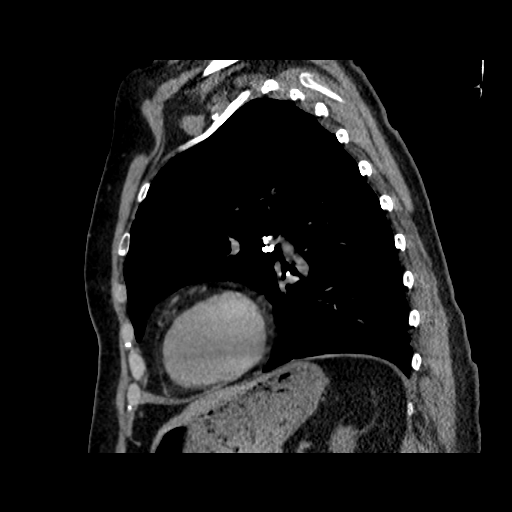
[im 116/145  mediastinal]
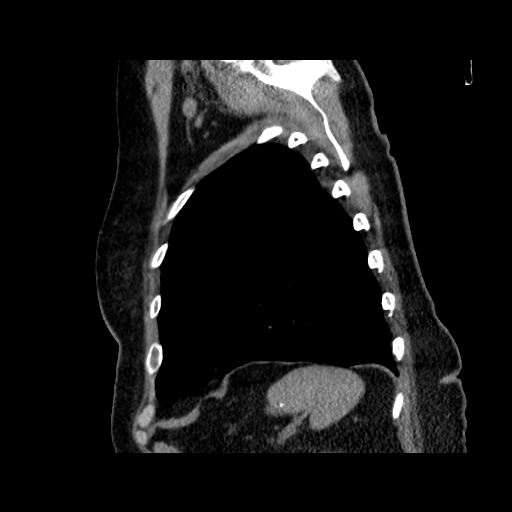
[im 135/145  mediastinal]
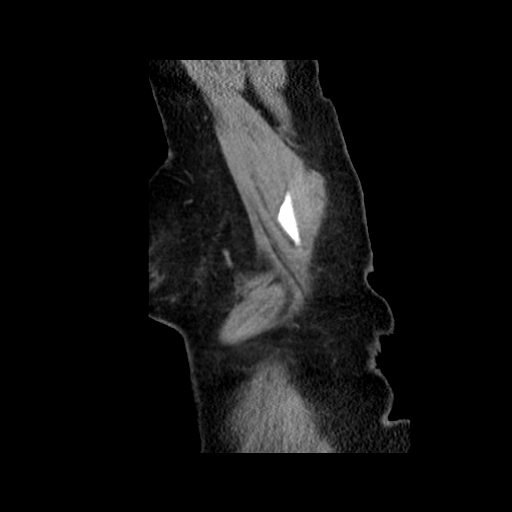

[16 of 30 positions shown; findings below may reference images not displayed]

FINDINGS: No pneumothorax or pleural effusion is noted. Calcified granuloma is
noted in the left lower lobe. Irregular reticular and ground-glass
opacity is noted laterally in the superior segment of the right
lower lobe measuring 2.0 x 1.8 cm. No other significant abnormality
is noted in the pulmonary parenchyma. Calcified left hilar lymph
nodes are noted most consistent with old granulomatous disease. No
evidence of thoracic aortic aneurysm is noted. Atherosclerotic
calcifications of descending thoracic aorta are noted. Visualized
portion of upper abdomen appears normal. No significant osseous
abnormality is noted in the chest.
IMPRESSION: 2 cm reticular and ground-glass opacity seen laterally in superior
segment of right lower lobe. Initial follow-up by chest CT without
contrast is recommended in 3 months to confirm persistence. This
recommendation follows the consensus statement: Recommendations for
the Management of Subsolid Pulmonary Nodules Detected at CT: A
Statement from the [HOSPITAL] as published in Radiology

## 2016-03-01 ENCOUNTER — Other Ambulatory Visit: Payer: Self-pay | Admitting: Sports Medicine

## 2016-03-10 DIAGNOSIS — Z6831 Body mass index (BMI) 31.0-31.9, adult: Secondary | ICD-10-CM | POA: Diagnosis not present

## 2016-03-10 DIAGNOSIS — R609 Edema, unspecified: Secondary | ICD-10-CM | POA: Diagnosis not present

## 2016-03-10 DIAGNOSIS — M1990 Unspecified osteoarthritis, unspecified site: Secondary | ICD-10-CM | POA: Diagnosis not present

## 2016-03-10 DIAGNOSIS — Z79899 Other long term (current) drug therapy: Secondary | ICD-10-CM | POA: Diagnosis not present

## 2016-03-10 DIAGNOSIS — L609 Nail disorder, unspecified: Secondary | ICD-10-CM | POA: Diagnosis not present

## 2016-03-10 DIAGNOSIS — J449 Chronic obstructive pulmonary disease, unspecified: Secondary | ICD-10-CM | POA: Diagnosis not present

## 2016-03-10 DIAGNOSIS — E669 Obesity, unspecified: Secondary | ICD-10-CM | POA: Diagnosis not present

## 2016-03-10 DIAGNOSIS — E663 Overweight: Secondary | ICD-10-CM | POA: Diagnosis not present

## 2016-04-01 DIAGNOSIS — B351 Tinea unguium: Secondary | ICD-10-CM | POA: Diagnosis not present

## 2016-04-09 DIAGNOSIS — Z79899 Other long term (current) drug therapy: Secondary | ICD-10-CM | POA: Diagnosis not present

## 2016-04-09 DIAGNOSIS — Z6831 Body mass index (BMI) 31.0-31.9, adult: Secondary | ICD-10-CM | POA: Diagnosis not present

## 2016-04-09 DIAGNOSIS — E785 Hyperlipidemia, unspecified: Secondary | ICD-10-CM | POA: Diagnosis not present

## 2016-04-09 DIAGNOSIS — M5430 Sciatica, unspecified side: Secondary | ICD-10-CM | POA: Diagnosis not present

## 2016-04-09 DIAGNOSIS — J449 Chronic obstructive pulmonary disease, unspecified: Secondary | ICD-10-CM | POA: Diagnosis not present

## 2016-04-09 DIAGNOSIS — E559 Vitamin D deficiency, unspecified: Secondary | ICD-10-CM | POA: Diagnosis not present

## 2016-05-10 DIAGNOSIS — E559 Vitamin D deficiency, unspecified: Secondary | ICD-10-CM | POA: Diagnosis not present

## 2016-05-10 DIAGNOSIS — N183 Chronic kidney disease, stage 3 (moderate): Secondary | ICD-10-CM | POA: Diagnosis not present

## 2016-05-10 DIAGNOSIS — Z79899 Other long term (current) drug therapy: Secondary | ICD-10-CM | POA: Diagnosis not present

## 2016-05-10 DIAGNOSIS — Z6832 Body mass index (BMI) 32.0-32.9, adult: Secondary | ICD-10-CM | POA: Diagnosis not present

## 2016-05-10 DIAGNOSIS — M5430 Sciatica, unspecified side: Secondary | ICD-10-CM | POA: Diagnosis not present

## 2016-05-10 DIAGNOSIS — E785 Hyperlipidemia, unspecified: Secondary | ICD-10-CM | POA: Diagnosis not present

## 2016-06-30 DIAGNOSIS — Z6831 Body mass index (BMI) 31.0-31.9, adult: Secondary | ICD-10-CM | POA: Diagnosis not present

## 2016-06-30 DIAGNOSIS — Z79899 Other long term (current) drug therapy: Secondary | ICD-10-CM | POA: Diagnosis not present

## 2016-06-30 DIAGNOSIS — M5430 Sciatica, unspecified side: Secondary | ICD-10-CM | POA: Diagnosis not present

## 2016-06-30 DIAGNOSIS — N390 Urinary tract infection, site not specified: Secondary | ICD-10-CM | POA: Diagnosis not present

## 2016-06-30 DIAGNOSIS — Z23 Encounter for immunization: Secondary | ICD-10-CM | POA: Diagnosis not present

## 2016-07-09 DIAGNOSIS — M5431 Sciatica, right side: Secondary | ICD-10-CM | POA: Diagnosis not present

## 2016-07-09 DIAGNOSIS — M419 Scoliosis, unspecified: Secondary | ICD-10-CM | POA: Diagnosis not present

## 2016-07-09 DIAGNOSIS — M4726 Other spondylosis with radiculopathy, lumbar region: Secondary | ICD-10-CM | POA: Diagnosis not present

## 2016-07-09 DIAGNOSIS — M549 Dorsalgia, unspecified: Secondary | ICD-10-CM | POA: Diagnosis not present

## 2016-07-09 DIAGNOSIS — M48062 Spinal stenosis, lumbar region with neurogenic claudication: Secondary | ICD-10-CM | POA: Diagnosis not present

## 2016-07-21 DIAGNOSIS — M545 Low back pain: Secondary | ICD-10-CM | POA: Diagnosis not present

## 2016-07-21 DIAGNOSIS — M4726 Other spondylosis with radiculopathy, lumbar region: Secondary | ICD-10-CM | POA: Diagnosis not present

## 2016-07-29 DIAGNOSIS — M48062 Spinal stenosis, lumbar region with neurogenic claudication: Secondary | ICD-10-CM | POA: Diagnosis not present

## 2016-07-29 DIAGNOSIS — M47816 Spondylosis without myelopathy or radiculopathy, lumbar region: Secondary | ICD-10-CM | POA: Diagnosis not present

## 2016-07-29 DIAGNOSIS — M419 Scoliosis, unspecified: Secondary | ICD-10-CM | POA: Diagnosis not present

## 2016-08-04 DIAGNOSIS — I10 Essential (primary) hypertension: Secondary | ICD-10-CM | POA: Diagnosis not present

## 2016-08-04 DIAGNOSIS — K219 Gastro-esophageal reflux disease without esophagitis: Secondary | ICD-10-CM | POA: Diagnosis not present

## 2016-08-04 DIAGNOSIS — Z79899 Other long term (current) drug therapy: Secondary | ICD-10-CM | POA: Diagnosis not present

## 2016-08-04 DIAGNOSIS — E559 Vitamin D deficiency, unspecified: Secondary | ICD-10-CM | POA: Diagnosis not present

## 2016-08-04 DIAGNOSIS — Z6833 Body mass index (BMI) 33.0-33.9, adult: Secondary | ICD-10-CM | POA: Diagnosis not present

## 2016-08-04 DIAGNOSIS — I77819 Aortic ectasia, unspecified site: Secondary | ICD-10-CM | POA: Diagnosis not present

## 2016-08-04 DIAGNOSIS — M5430 Sciatica, unspecified side: Secondary | ICD-10-CM | POA: Diagnosis not present

## 2016-08-04 DIAGNOSIS — D649 Anemia, unspecified: Secondary | ICD-10-CM | POA: Diagnosis not present

## 2016-08-04 DIAGNOSIS — E785 Hyperlipidemia, unspecified: Secondary | ICD-10-CM | POA: Diagnosis not present

## 2016-08-05 DIAGNOSIS — L820 Inflamed seborrheic keratosis: Secondary | ICD-10-CM | POA: Diagnosis not present

## 2016-08-05 DIAGNOSIS — L853 Xerosis cutis: Secondary | ICD-10-CM | POA: Diagnosis not present

## 2016-08-05 DIAGNOSIS — L821 Other seborrheic keratosis: Secondary | ICD-10-CM | POA: Diagnosis not present

## 2016-08-05 DIAGNOSIS — L578 Other skin changes due to chronic exposure to nonionizing radiation: Secondary | ICD-10-CM | POA: Diagnosis not present

## 2016-08-16 DIAGNOSIS — M47816 Spondylosis without myelopathy or radiculopathy, lumbar region: Secondary | ICD-10-CM | POA: Diagnosis not present

## 2016-09-03 DIAGNOSIS — Z720 Tobacco use: Secondary | ICD-10-CM | POA: Diagnosis not present

## 2016-09-03 DIAGNOSIS — Z6833 Body mass index (BMI) 33.0-33.9, adult: Secondary | ICD-10-CM | POA: Diagnosis not present

## 2016-09-03 DIAGNOSIS — R21 Rash and other nonspecific skin eruption: Secondary | ICD-10-CM | POA: Diagnosis not present

## 2016-09-03 DIAGNOSIS — Z79899 Other long term (current) drug therapy: Secondary | ICD-10-CM | POA: Diagnosis not present

## 2016-09-03 DIAGNOSIS — M9989 Other biomechanical lesions of abdomen and other regions: Secondary | ICD-10-CM | POA: Diagnosis not present

## 2016-09-03 DIAGNOSIS — T300 Burn of unspecified body region, unspecified degree: Secondary | ICD-10-CM | POA: Diagnosis not present

## 2016-10-05 DIAGNOSIS — Z6833 Body mass index (BMI) 33.0-33.9, adult: Secondary | ICD-10-CM | POA: Diagnosis not present

## 2016-10-05 DIAGNOSIS — R300 Dysuria: Secondary | ICD-10-CM | POA: Diagnosis not present

## 2016-10-05 DIAGNOSIS — M9989 Other biomechanical lesions of abdomen and other regions: Secondary | ICD-10-CM | POA: Diagnosis not present

## 2016-10-05 DIAGNOSIS — J449 Chronic obstructive pulmonary disease, unspecified: Secondary | ICD-10-CM | POA: Diagnosis not present

## 2016-10-05 DIAGNOSIS — Z79899 Other long term (current) drug therapy: Secondary | ICD-10-CM | POA: Diagnosis not present

## 2016-11-04 DIAGNOSIS — M9989 Other biomechanical lesions of abdomen and other regions: Secondary | ICD-10-CM | POA: Diagnosis not present

## 2016-11-04 DIAGNOSIS — N390 Urinary tract infection, site not specified: Secondary | ICD-10-CM | POA: Diagnosis not present

## 2016-11-04 DIAGNOSIS — Z79899 Other long term (current) drug therapy: Secondary | ICD-10-CM | POA: Diagnosis not present

## 2016-11-04 DIAGNOSIS — Z6833 Body mass index (BMI) 33.0-33.9, adult: Secondary | ICD-10-CM | POA: Diagnosis not present

## 2016-12-06 DIAGNOSIS — Z6833 Body mass index (BMI) 33.0-33.9, adult: Secondary | ICD-10-CM | POA: Diagnosis not present

## 2016-12-06 DIAGNOSIS — F329 Major depressive disorder, single episode, unspecified: Secondary | ICD-10-CM | POA: Diagnosis not present

## 2016-12-06 DIAGNOSIS — M5430 Sciatica, unspecified side: Secondary | ICD-10-CM | POA: Diagnosis not present

## 2016-12-06 DIAGNOSIS — E785 Hyperlipidemia, unspecified: Secondary | ICD-10-CM | POA: Diagnosis not present

## 2016-12-06 DIAGNOSIS — R609 Edema, unspecified: Secondary | ICD-10-CM | POA: Diagnosis not present

## 2016-12-06 DIAGNOSIS — E669 Obesity, unspecified: Secondary | ICD-10-CM | POA: Diagnosis not present

## 2016-12-06 DIAGNOSIS — J309 Allergic rhinitis, unspecified: Secondary | ICD-10-CM | POA: Diagnosis not present

## 2016-12-06 DIAGNOSIS — Z79899 Other long term (current) drug therapy: Secondary | ICD-10-CM | POA: Diagnosis not present

## 2016-12-06 DIAGNOSIS — I10 Essential (primary) hypertension: Secondary | ICD-10-CM | POA: Diagnosis not present

## 2017-01-06 DIAGNOSIS — Z6834 Body mass index (BMI) 34.0-34.9, adult: Secondary | ICD-10-CM | POA: Diagnosis not present

## 2017-01-06 DIAGNOSIS — E785 Hyperlipidemia, unspecified: Secondary | ICD-10-CM | POA: Diagnosis not present

## 2017-01-06 DIAGNOSIS — M5430 Sciatica, unspecified side: Secondary | ICD-10-CM | POA: Diagnosis not present

## 2017-01-06 DIAGNOSIS — Z9181 History of falling: Secondary | ICD-10-CM | POA: Diagnosis not present

## 2017-01-06 DIAGNOSIS — E559 Vitamin D deficiency, unspecified: Secondary | ICD-10-CM | POA: Diagnosis not present

## 2017-01-06 DIAGNOSIS — Z79899 Other long term (current) drug therapy: Secondary | ICD-10-CM | POA: Diagnosis not present

## 2017-01-06 DIAGNOSIS — N183 Chronic kidney disease, stage 3 (moderate): Secondary | ICD-10-CM | POA: Diagnosis not present

## 2017-01-17 DIAGNOSIS — M48062 Spinal stenosis, lumbar region with neurogenic claudication: Secondary | ICD-10-CM | POA: Diagnosis not present

## 2017-02-08 DIAGNOSIS — E785 Hyperlipidemia, unspecified: Secondary | ICD-10-CM | POA: Diagnosis not present

## 2017-02-08 DIAGNOSIS — Z6833 Body mass index (BMI) 33.0-33.9, adult: Secondary | ICD-10-CM | POA: Diagnosis not present

## 2017-02-08 DIAGNOSIS — Z79899 Other long term (current) drug therapy: Secondary | ICD-10-CM | POA: Diagnosis not present

## 2017-02-08 DIAGNOSIS — M5430 Sciatica, unspecified side: Secondary | ICD-10-CM | POA: Diagnosis not present

## 2017-02-08 DIAGNOSIS — N183 Chronic kidney disease, stage 3 (moderate): Secondary | ICD-10-CM | POA: Diagnosis not present

## 2017-03-11 DIAGNOSIS — Z6833 Body mass index (BMI) 33.0-33.9, adult: Secondary | ICD-10-CM | POA: Diagnosis not present

## 2017-03-11 DIAGNOSIS — N183 Chronic kidney disease, stage 3 (moderate): Secondary | ICD-10-CM | POA: Diagnosis not present

## 2017-03-11 DIAGNOSIS — Z79899 Other long term (current) drug therapy: Secondary | ICD-10-CM | POA: Diagnosis not present

## 2017-03-11 DIAGNOSIS — K219 Gastro-esophageal reflux disease without esophagitis: Secondary | ICD-10-CM | POA: Diagnosis not present

## 2017-03-11 DIAGNOSIS — N390 Urinary tract infection, site not specified: Secondary | ICD-10-CM | POA: Diagnosis not present

## 2017-03-11 DIAGNOSIS — M5430 Sciatica, unspecified side: Secondary | ICD-10-CM | POA: Diagnosis not present

## 2017-04-11 DIAGNOSIS — I10 Essential (primary) hypertension: Secondary | ICD-10-CM | POA: Diagnosis not present

## 2017-04-11 DIAGNOSIS — N183 Chronic kidney disease, stage 3 (moderate): Secondary | ICD-10-CM | POA: Diagnosis not present

## 2017-04-11 DIAGNOSIS — Z6834 Body mass index (BMI) 34.0-34.9, adult: Secondary | ICD-10-CM | POA: Diagnosis not present

## 2017-04-11 DIAGNOSIS — Z79899 Other long term (current) drug therapy: Secondary | ICD-10-CM | POA: Diagnosis not present

## 2017-04-11 DIAGNOSIS — M5430 Sciatica, unspecified side: Secondary | ICD-10-CM | POA: Diagnosis not present

## 2017-05-12 DIAGNOSIS — M9989 Other biomechanical lesions of abdomen and other regions: Secondary | ICD-10-CM | POA: Diagnosis not present

## 2017-05-12 DIAGNOSIS — J449 Chronic obstructive pulmonary disease, unspecified: Secondary | ICD-10-CM | POA: Diagnosis not present

## 2017-05-12 DIAGNOSIS — I10 Essential (primary) hypertension: Secondary | ICD-10-CM | POA: Diagnosis not present

## 2017-05-12 DIAGNOSIS — Z6834 Body mass index (BMI) 34.0-34.9, adult: Secondary | ICD-10-CM | POA: Diagnosis not present

## 2017-05-12 DIAGNOSIS — M5430 Sciatica, unspecified side: Secondary | ICD-10-CM | POA: Diagnosis not present

## 2017-05-12 DIAGNOSIS — Z79899 Other long term (current) drug therapy: Secondary | ICD-10-CM | POA: Diagnosis not present

## 2017-05-12 DIAGNOSIS — N390 Urinary tract infection, site not specified: Secondary | ICD-10-CM | POA: Diagnosis not present

## 2017-05-27 DIAGNOSIS — Z79899 Other long term (current) drug therapy: Secondary | ICD-10-CM | POA: Diagnosis not present

## 2017-05-27 DIAGNOSIS — N3281 Overactive bladder: Secondary | ICD-10-CM | POA: Diagnosis not present

## 2017-05-27 DIAGNOSIS — N952 Postmenopausal atrophic vaginitis: Secondary | ICD-10-CM | POA: Diagnosis not present

## 2017-05-27 DIAGNOSIS — B373 Candidiasis of vulva and vagina: Secondary | ICD-10-CM | POA: Diagnosis not present

## 2017-05-27 DIAGNOSIS — N390 Urinary tract infection, site not specified: Secondary | ICD-10-CM | POA: Diagnosis not present

## 2017-05-27 DIAGNOSIS — N3946 Mixed incontinence: Secondary | ICD-10-CM | POA: Diagnosis not present

## 2017-06-13 DIAGNOSIS — R238 Other skin changes: Secondary | ICD-10-CM | POA: Diagnosis not present

## 2017-06-13 DIAGNOSIS — N390 Urinary tract infection, site not specified: Secondary | ICD-10-CM | POA: Diagnosis not present

## 2017-06-13 DIAGNOSIS — Z79899 Other long term (current) drug therapy: Secondary | ICD-10-CM | POA: Diagnosis not present

## 2017-06-13 DIAGNOSIS — Z9181 History of falling: Secondary | ICD-10-CM | POA: Diagnosis not present

## 2017-06-13 DIAGNOSIS — E669 Obesity, unspecified: Secondary | ICD-10-CM | POA: Diagnosis not present

## 2017-06-13 DIAGNOSIS — M9989 Other biomechanical lesions of abdomen and other regions: Secondary | ICD-10-CM | POA: Diagnosis not present

## 2017-06-13 DIAGNOSIS — Z6832 Body mass index (BMI) 32.0-32.9, adult: Secondary | ICD-10-CM | POA: Diagnosis not present

## 2017-07-13 DIAGNOSIS — Z23 Encounter for immunization: Secondary | ICD-10-CM | POA: Diagnosis not present

## 2017-07-13 DIAGNOSIS — Z6832 Body mass index (BMI) 32.0-32.9, adult: Secondary | ICD-10-CM | POA: Diagnosis not present

## 2017-07-13 DIAGNOSIS — Z79899 Other long term (current) drug therapy: Secondary | ICD-10-CM | POA: Diagnosis not present

## 2017-07-13 DIAGNOSIS — M1990 Unspecified osteoarthritis, unspecified site: Secondary | ICD-10-CM | POA: Diagnosis not present

## 2017-07-13 DIAGNOSIS — I77819 Aortic ectasia, unspecified site: Secondary | ICD-10-CM | POA: Diagnosis not present

## 2017-07-13 DIAGNOSIS — R109 Unspecified abdominal pain: Secondary | ICD-10-CM | POA: Diagnosis not present

## 2017-08-10 DIAGNOSIS — N390 Urinary tract infection, site not specified: Secondary | ICD-10-CM | POA: Diagnosis not present

## 2017-08-10 DIAGNOSIS — N3281 Overactive bladder: Secondary | ICD-10-CM | POA: Diagnosis not present

## 2017-08-10 DIAGNOSIS — N952 Postmenopausal atrophic vaginitis: Secondary | ICD-10-CM | POA: Diagnosis not present

## 2017-08-11 DIAGNOSIS — Z79899 Other long term (current) drug therapy: Secondary | ICD-10-CM | POA: Diagnosis not present

## 2017-08-11 DIAGNOSIS — K219 Gastro-esophageal reflux disease without esophagitis: Secondary | ICD-10-CM | POA: Diagnosis not present

## 2017-08-11 DIAGNOSIS — N390 Urinary tract infection, site not specified: Secondary | ICD-10-CM | POA: Diagnosis not present

## 2017-08-11 DIAGNOSIS — M1990 Unspecified osteoarthritis, unspecified site: Secondary | ICD-10-CM | POA: Diagnosis not present

## 2017-08-11 DIAGNOSIS — Z6832 Body mass index (BMI) 32.0-32.9, adult: Secondary | ICD-10-CM | POA: Diagnosis not present

## 2017-09-08 DIAGNOSIS — M5430 Sciatica, unspecified side: Secondary | ICD-10-CM | POA: Diagnosis not present

## 2017-09-08 DIAGNOSIS — J441 Chronic obstructive pulmonary disease with (acute) exacerbation: Secondary | ICD-10-CM | POA: Diagnosis not present

## 2017-09-08 DIAGNOSIS — Z79899 Other long term (current) drug therapy: Secondary | ICD-10-CM | POA: Diagnosis not present

## 2017-09-08 DIAGNOSIS — Z6831 Body mass index (BMI) 31.0-31.9, adult: Secondary | ICD-10-CM | POA: Diagnosis not present

## 2017-09-08 DIAGNOSIS — J449 Chronic obstructive pulmonary disease, unspecified: Secondary | ICD-10-CM | POA: Diagnosis not present

## 2017-10-10 DIAGNOSIS — M48062 Spinal stenosis, lumbar region with neurogenic claudication: Secondary | ICD-10-CM | POA: Diagnosis not present

## 2017-10-12 DIAGNOSIS — E559 Vitamin D deficiency, unspecified: Secondary | ICD-10-CM | POA: Diagnosis not present

## 2017-10-12 DIAGNOSIS — Z79899 Other long term (current) drug therapy: Secondary | ICD-10-CM | POA: Diagnosis not present

## 2017-10-12 DIAGNOSIS — J449 Chronic obstructive pulmonary disease, unspecified: Secondary | ICD-10-CM | POA: Diagnosis not present

## 2017-10-12 DIAGNOSIS — E785 Hyperlipidemia, unspecified: Secondary | ICD-10-CM | POA: Diagnosis not present

## 2017-10-12 DIAGNOSIS — Z6831 Body mass index (BMI) 31.0-31.9, adult: Secondary | ICD-10-CM | POA: Diagnosis not present

## 2017-10-12 DIAGNOSIS — M5430 Sciatica, unspecified side: Secondary | ICD-10-CM | POA: Diagnosis not present

## 2017-10-12 DIAGNOSIS — I10 Essential (primary) hypertension: Secondary | ICD-10-CM | POA: Diagnosis not present

## 2017-11-14 DIAGNOSIS — Z79899 Other long term (current) drug therapy: Secondary | ICD-10-CM | POA: Diagnosis not present

## 2017-11-14 DIAGNOSIS — E785 Hyperlipidemia, unspecified: Secondary | ICD-10-CM | POA: Diagnosis not present

## 2017-11-14 DIAGNOSIS — M5430 Sciatica, unspecified side: Secondary | ICD-10-CM | POA: Diagnosis not present

## 2017-11-14 DIAGNOSIS — E559 Vitamin D deficiency, unspecified: Secondary | ICD-10-CM | POA: Diagnosis not present

## 2017-11-14 DIAGNOSIS — K219 Gastro-esophageal reflux disease without esophagitis: Secondary | ICD-10-CM | POA: Diagnosis not present

## 2017-11-14 DIAGNOSIS — Z6830 Body mass index (BMI) 30.0-30.9, adult: Secondary | ICD-10-CM | POA: Diagnosis not present

## 2017-12-12 DIAGNOSIS — M5430 Sciatica, unspecified side: Secondary | ICD-10-CM | POA: Diagnosis not present

## 2017-12-12 DIAGNOSIS — Z6832 Body mass index (BMI) 32.0-32.9, adult: Secondary | ICD-10-CM | POA: Diagnosis not present

## 2017-12-12 DIAGNOSIS — Z79899 Other long term (current) drug therapy: Secondary | ICD-10-CM | POA: Diagnosis not present

## 2017-12-12 DIAGNOSIS — E785 Hyperlipidemia, unspecified: Secondary | ICD-10-CM | POA: Diagnosis not present

## 2017-12-29 DIAGNOSIS — Z961 Presence of intraocular lens: Secondary | ICD-10-CM | POA: Diagnosis not present

## 2017-12-29 DIAGNOSIS — H524 Presbyopia: Secondary | ICD-10-CM | POA: Diagnosis not present

## 2017-12-29 DIAGNOSIS — H353131 Nonexudative age-related macular degeneration, bilateral, early dry stage: Secondary | ICD-10-CM | POA: Diagnosis not present

## 2018-01-17 DIAGNOSIS — M5430 Sciatica, unspecified side: Secondary | ICD-10-CM | POA: Diagnosis not present

## 2018-01-17 DIAGNOSIS — E559 Vitamin D deficiency, unspecified: Secondary | ICD-10-CM | POA: Diagnosis not present

## 2018-01-17 DIAGNOSIS — Z6831 Body mass index (BMI) 31.0-31.9, adult: Secondary | ICD-10-CM | POA: Diagnosis not present

## 2018-01-17 DIAGNOSIS — Z79899 Other long term (current) drug therapy: Secondary | ICD-10-CM | POA: Diagnosis not present

## 2018-01-17 DIAGNOSIS — I10 Essential (primary) hypertension: Secondary | ICD-10-CM | POA: Diagnosis not present

## 2018-01-17 DIAGNOSIS — M25569 Pain in unspecified knee: Secondary | ICD-10-CM | POA: Diagnosis not present

## 2018-01-17 DIAGNOSIS — E785 Hyperlipidemia, unspecified: Secondary | ICD-10-CM | POA: Diagnosis not present

## 2018-02-23 DIAGNOSIS — Z79899 Other long term (current) drug therapy: Secondary | ICD-10-CM | POA: Diagnosis not present

## 2018-02-23 DIAGNOSIS — E785 Hyperlipidemia, unspecified: Secondary | ICD-10-CM | POA: Diagnosis not present

## 2018-02-23 DIAGNOSIS — E559 Vitamin D deficiency, unspecified: Secondary | ICD-10-CM | POA: Diagnosis not present

## 2018-02-23 DIAGNOSIS — M5430 Sciatica, unspecified side: Secondary | ICD-10-CM | POA: Diagnosis not present

## 2018-02-23 DIAGNOSIS — N183 Chronic kidney disease, stage 3 (moderate): Secondary | ICD-10-CM | POA: Diagnosis not present

## 2018-04-06 DIAGNOSIS — R1310 Dysphagia, unspecified: Secondary | ICD-10-CM | POA: Diagnosis present

## 2018-04-06 DIAGNOSIS — Z808 Family history of malignant neoplasm of other organs or systems: Secondary | ICD-10-CM | POA: Diagnosis not present

## 2018-04-06 DIAGNOSIS — I6521 Occlusion and stenosis of right carotid artery: Secondary | ICD-10-CM | POA: Diagnosis present

## 2018-04-06 DIAGNOSIS — I63512 Cerebral infarction due to unspecified occlusion or stenosis of left middle cerebral artery: Secondary | ICD-10-CM | POA: Diagnosis not present

## 2018-04-06 DIAGNOSIS — R414 Neurologic neglect syndrome: Secondary | ICD-10-CM | POA: Diagnosis present

## 2018-04-06 DIAGNOSIS — E876 Hypokalemia: Secondary | ICD-10-CM | POA: Diagnosis present

## 2018-04-06 DIAGNOSIS — R279 Unspecified lack of coordination: Secondary | ICD-10-CM | POA: Diagnosis not present

## 2018-04-06 DIAGNOSIS — Z7902 Long term (current) use of antithrombotics/antiplatelets: Secondary | ICD-10-CM | POA: Diagnosis not present

## 2018-04-06 DIAGNOSIS — I351 Nonrheumatic aortic (valve) insufficiency: Secondary | ICD-10-CM | POA: Diagnosis not present

## 2018-04-06 DIAGNOSIS — M6281 Muscle weakness (generalized): Secondary | ICD-10-CM | POA: Diagnosis not present

## 2018-04-06 DIAGNOSIS — I69322 Dysarthria following cerebral infarction: Secondary | ICD-10-CM | POA: Diagnosis not present

## 2018-04-06 DIAGNOSIS — I6350 Cerebral infarction due to unspecified occlusion or stenosis of unspecified cerebral artery: Secondary | ICD-10-CM | POA: Diagnosis not present

## 2018-04-06 DIAGNOSIS — G8194 Hemiplegia, unspecified affecting left nondominant side: Secondary | ICD-10-CM | POA: Diagnosis present

## 2018-04-06 DIAGNOSIS — J45909 Unspecified asthma, uncomplicated: Secondary | ICD-10-CM | POA: Diagnosis not present

## 2018-04-06 DIAGNOSIS — Z7982 Long term (current) use of aspirin: Secondary | ICD-10-CM | POA: Diagnosis not present

## 2018-04-06 DIAGNOSIS — R4781 Slurred speech: Secondary | ICD-10-CM | POA: Diagnosis present

## 2018-04-06 DIAGNOSIS — J449 Chronic obstructive pulmonary disease, unspecified: Secondary | ICD-10-CM | POA: Diagnosis not present

## 2018-04-06 DIAGNOSIS — G459 Transient cerebral ischemic attack, unspecified: Secondary | ICD-10-CM | POA: Diagnosis present

## 2018-04-06 DIAGNOSIS — Z743 Need for continuous supervision: Secondary | ICD-10-CM | POA: Diagnosis not present

## 2018-04-06 DIAGNOSIS — Z79899 Other long term (current) drug therapy: Secondary | ICD-10-CM | POA: Diagnosis not present

## 2018-04-06 DIAGNOSIS — I639 Cerebral infarction, unspecified: Secondary | ICD-10-CM | POA: Diagnosis not present

## 2018-04-06 DIAGNOSIS — Z87440 Personal history of urinary (tract) infections: Secondary | ICD-10-CM | POA: Diagnosis not present

## 2018-04-06 DIAGNOSIS — Z7901 Long term (current) use of anticoagulants: Secondary | ICD-10-CM | POA: Diagnosis not present

## 2018-04-06 DIAGNOSIS — R29810 Facial weakness: Secondary | ICD-10-CM | POA: Diagnosis not present

## 2018-04-06 DIAGNOSIS — G629 Polyneuropathy, unspecified: Secondary | ICD-10-CM | POA: Diagnosis present

## 2018-04-06 DIAGNOSIS — Z9889 Other specified postprocedural states: Secondary | ICD-10-CM | POA: Diagnosis not present

## 2018-04-06 DIAGNOSIS — M419 Scoliosis, unspecified: Secondary | ICD-10-CM | POA: Diagnosis not present

## 2018-04-06 DIAGNOSIS — R29700 NIHSS score 0: Secondary | ICD-10-CM | POA: Diagnosis present

## 2018-04-06 DIAGNOSIS — R739 Hyperglycemia, unspecified: Secondary | ICD-10-CM | POA: Diagnosis not present

## 2018-04-06 DIAGNOSIS — J42 Unspecified chronic bronchitis: Secondary | ICD-10-CM | POA: Diagnosis not present

## 2018-04-06 DIAGNOSIS — N183 Chronic kidney disease, stage 3 (moderate): Secondary | ICD-10-CM | POA: Diagnosis present

## 2018-04-06 DIAGNOSIS — R471 Dysarthria and anarthria: Secondary | ICD-10-CM | POA: Diagnosis present

## 2018-04-06 DIAGNOSIS — K219 Gastro-esophageal reflux disease without esophagitis: Secondary | ICD-10-CM | POA: Diagnosis present

## 2018-04-06 DIAGNOSIS — I69391 Dysphagia following cerebral infarction: Secondary | ICD-10-CM | POA: Diagnosis not present

## 2018-04-06 DIAGNOSIS — R479 Unspecified speech disturbances: Secondary | ICD-10-CM | POA: Diagnosis not present

## 2018-04-06 DIAGNOSIS — R531 Weakness: Secondary | ICD-10-CM | POA: Diagnosis not present

## 2018-04-06 DIAGNOSIS — I63231 Cerebral infarction due to unspecified occlusion or stenosis of right carotid arteries: Secondary | ICD-10-CM | POA: Diagnosis present

## 2018-04-06 DIAGNOSIS — I6523 Occlusion and stenosis of bilateral carotid arteries: Secondary | ICD-10-CM | POA: Diagnosis not present

## 2018-04-06 DIAGNOSIS — M5430 Sciatica, unspecified side: Secondary | ICD-10-CM | POA: Diagnosis present

## 2018-04-06 DIAGNOSIS — R51 Headache: Secondary | ICD-10-CM | POA: Diagnosis not present

## 2018-04-06 DIAGNOSIS — N390 Urinary tract infection, site not specified: Secondary | ICD-10-CM | POA: Diagnosis not present

## 2018-04-06 DIAGNOSIS — R2990 Unspecified symptoms and signs involving the nervous system: Secondary | ICD-10-CM | POA: Diagnosis not present

## 2018-04-06 DIAGNOSIS — E559 Vitamin D deficiency, unspecified: Secondary | ICD-10-CM | POA: Diagnosis present

## 2018-04-06 DIAGNOSIS — Z95828 Presence of other vascular implants and grafts: Secondary | ICD-10-CM | POA: Diagnosis not present

## 2018-04-06 DIAGNOSIS — Z978 Presence of other specified devices: Secondary | ICD-10-CM | POA: Diagnosis not present

## 2018-04-06 DIAGNOSIS — H4902 Third [oculomotor] nerve palsy, left eye: Secondary | ICD-10-CM | POA: Diagnosis not present

## 2018-04-06 DIAGNOSIS — E785 Hyperlipidemia, unspecified: Secondary | ICD-10-CM | POA: Diagnosis not present

## 2018-04-06 DIAGNOSIS — I63511 Cerebral infarction due to unspecified occlusion or stenosis of right middle cerebral artery: Secondary | ICD-10-CM | POA: Diagnosis present

## 2018-04-06 DIAGNOSIS — F17210 Nicotine dependence, cigarettes, uncomplicated: Secondary | ICD-10-CM | POA: Diagnosis present

## 2018-04-06 DIAGNOSIS — D649 Anemia, unspecified: Secondary | ICD-10-CM | POA: Diagnosis present

## 2018-04-06 DIAGNOSIS — Z96653 Presence of artificial knee joint, bilateral: Secondary | ICD-10-CM | POA: Diagnosis present

## 2018-04-06 DIAGNOSIS — F17200 Nicotine dependence, unspecified, uncomplicated: Secondary | ICD-10-CM | POA: Diagnosis not present

## 2018-04-06 DIAGNOSIS — I63031 Cerebral infarction due to thrombosis of right carotid artery: Secondary | ICD-10-CM | POA: Diagnosis not present

## 2018-04-06 DIAGNOSIS — R001 Bradycardia, unspecified: Secondary | ICD-10-CM | POA: Diagnosis not present

## 2018-04-06 DIAGNOSIS — Z8249 Family history of ischemic heart disease and other diseases of the circulatory system: Secondary | ICD-10-CM | POA: Diagnosis not present

## 2018-04-06 DIAGNOSIS — Z8262 Family history of osteoporosis: Secondary | ICD-10-CM | POA: Diagnosis not present

## 2018-04-06 DIAGNOSIS — R600 Localized edema: Secondary | ICD-10-CM | POA: Diagnosis not present

## 2018-04-06 DIAGNOSIS — N189 Chronic kidney disease, unspecified: Secondary | ICD-10-CM | POA: Diagnosis not present

## 2018-04-06 DIAGNOSIS — I6389 Other cerebral infarction: Secondary | ICD-10-CM | POA: Diagnosis not present

## 2018-04-06 DIAGNOSIS — I6601 Occlusion and stenosis of right middle cerebral artery: Secondary | ICD-10-CM | POA: Diagnosis not present

## 2018-04-06 DIAGNOSIS — E7800 Pure hypercholesterolemia, unspecified: Secondary | ICD-10-CM | POA: Diagnosis not present

## 2018-04-06 DIAGNOSIS — I129 Hypertensive chronic kidney disease with stage 1 through stage 4 chronic kidney disease, or unspecified chronic kidney disease: Secondary | ICD-10-CM | POA: Diagnosis present

## 2018-04-06 DIAGNOSIS — I4510 Unspecified right bundle-branch block: Secondary | ICD-10-CM | POA: Diagnosis not present

## 2018-04-06 DIAGNOSIS — I10 Essential (primary) hypertension: Secondary | ICD-10-CM | POA: Diagnosis not present

## 2018-04-06 DIAGNOSIS — F329 Major depressive disorder, single episode, unspecified: Secondary | ICD-10-CM | POA: Diagnosis present

## 2018-04-06 DIAGNOSIS — G894 Chronic pain syndrome: Secondary | ICD-10-CM | POA: Diagnosis present

## 2018-04-06 DIAGNOSIS — Z8349 Family history of other endocrine, nutritional and metabolic diseases: Secondary | ICD-10-CM | POA: Diagnosis not present

## 2018-04-14 DIAGNOSIS — I10 Essential (primary) hypertension: Secondary | ICD-10-CM | POA: Diagnosis not present

## 2018-04-14 DIAGNOSIS — I69391 Dysphagia following cerebral infarction: Secondary | ICD-10-CM | POA: Diagnosis not present

## 2018-04-14 DIAGNOSIS — R279 Unspecified lack of coordination: Secondary | ICD-10-CM | POA: Diagnosis not present

## 2018-04-14 DIAGNOSIS — N183 Chronic kidney disease, stage 3 (moderate): Secondary | ICD-10-CM | POA: Diagnosis not present

## 2018-04-14 DIAGNOSIS — I639 Cerebral infarction, unspecified: Secondary | ICD-10-CM | POA: Diagnosis not present

## 2018-04-14 DIAGNOSIS — J9621 Acute and chronic respiratory failure with hypoxia: Secondary | ICD-10-CM | POA: Diagnosis not present

## 2018-04-14 DIAGNOSIS — Z5181 Encounter for therapeutic drug level monitoring: Secondary | ICD-10-CM | POA: Diagnosis not present

## 2018-04-14 DIAGNOSIS — M25522 Pain in left elbow: Secondary | ICD-10-CM | POA: Diagnosis not present

## 2018-04-14 DIAGNOSIS — E785 Hyperlipidemia, unspecified: Secondary | ICD-10-CM | POA: Diagnosis not present

## 2018-04-14 DIAGNOSIS — I69322 Dysarthria following cerebral infarction: Secondary | ICD-10-CM | POA: Diagnosis not present

## 2018-04-14 DIAGNOSIS — I739 Peripheral vascular disease, unspecified: Secondary | ICD-10-CM | POA: Diagnosis not present

## 2018-04-14 DIAGNOSIS — K219 Gastro-esophageal reflux disease without esophagitis: Secondary | ICD-10-CM | POA: Diagnosis not present

## 2018-04-14 DIAGNOSIS — Z743 Need for continuous supervision: Secondary | ICD-10-CM | POA: Diagnosis not present

## 2018-04-14 DIAGNOSIS — I471 Supraventricular tachycardia: Secondary | ICD-10-CM | POA: Diagnosis not present

## 2018-04-14 DIAGNOSIS — J449 Chronic obstructive pulmonary disease, unspecified: Secondary | ICD-10-CM | POA: Diagnosis not present

## 2018-04-18 DIAGNOSIS — I739 Peripheral vascular disease, unspecified: Secondary | ICD-10-CM | POA: Diagnosis not present

## 2018-04-18 DIAGNOSIS — E785 Hyperlipidemia, unspecified: Secondary | ICD-10-CM | POA: Diagnosis not present

## 2018-04-18 DIAGNOSIS — J9621 Acute and chronic respiratory failure with hypoxia: Secondary | ICD-10-CM | POA: Diagnosis not present

## 2018-04-18 DIAGNOSIS — I639 Cerebral infarction, unspecified: Secondary | ICD-10-CM | POA: Diagnosis not present

## 2018-05-03 DIAGNOSIS — Z5181 Encounter for therapeutic drug level monitoring: Secondary | ICD-10-CM | POA: Diagnosis not present

## 2018-05-03 DIAGNOSIS — I639 Cerebral infarction, unspecified: Secondary | ICD-10-CM | POA: Diagnosis not present

## 2018-05-11 DIAGNOSIS — I471 Supraventricular tachycardia: Secondary | ICD-10-CM | POA: Diagnosis not present

## 2018-05-16 DIAGNOSIS — M79672 Pain in left foot: Secondary | ICD-10-CM | POA: Diagnosis not present

## 2018-05-16 DIAGNOSIS — B351 Tinea unguium: Secondary | ICD-10-CM | POA: Diagnosis not present

## 2018-05-19 DIAGNOSIS — I739 Peripheral vascular disease, unspecified: Secondary | ICD-10-CM | POA: Diagnosis not present

## 2018-05-19 DIAGNOSIS — M810 Age-related osteoporosis without current pathological fracture: Secondary | ICD-10-CM | POA: Diagnosis not present

## 2018-05-19 DIAGNOSIS — I69322 Dysarthria following cerebral infarction: Secondary | ICD-10-CM | POA: Diagnosis not present

## 2018-05-19 DIAGNOSIS — N3020 Other chronic cystitis without hematuria: Secondary | ICD-10-CM | POA: Diagnosis not present

## 2018-05-19 DIAGNOSIS — E559 Vitamin D deficiency, unspecified: Secondary | ICD-10-CM | POA: Diagnosis not present

## 2018-05-19 DIAGNOSIS — F17210 Nicotine dependence, cigarettes, uncomplicated: Secondary | ICD-10-CM | POA: Diagnosis not present

## 2018-05-19 DIAGNOSIS — I2510 Atherosclerotic heart disease of native coronary artery without angina pectoris: Secondary | ICD-10-CM | POA: Diagnosis not present

## 2018-05-19 DIAGNOSIS — N183 Chronic kidney disease, stage 3 (moderate): Secondary | ICD-10-CM | POA: Diagnosis not present

## 2018-05-19 DIAGNOSIS — F329 Major depressive disorder, single episode, unspecified: Secondary | ICD-10-CM | POA: Diagnosis not present

## 2018-05-19 DIAGNOSIS — I69320 Aphasia following cerebral infarction: Secondary | ICD-10-CM | POA: Diagnosis not present

## 2018-05-19 DIAGNOSIS — I69391 Dysphagia following cerebral infarction: Secondary | ICD-10-CM | POA: Diagnosis not present

## 2018-05-19 DIAGNOSIS — R1310 Dysphagia, unspecified: Secondary | ICD-10-CM | POA: Diagnosis not present

## 2018-05-19 DIAGNOSIS — I69354 Hemiplegia and hemiparesis following cerebral infarction affecting left non-dominant side: Secondary | ICD-10-CM | POA: Diagnosis not present

## 2018-05-19 DIAGNOSIS — I129 Hypertensive chronic kidney disease with stage 1 through stage 4 chronic kidney disease, or unspecified chronic kidney disease: Secondary | ICD-10-CM | POA: Diagnosis not present

## 2018-05-19 DIAGNOSIS — G629 Polyneuropathy, unspecified: Secondary | ICD-10-CM | POA: Diagnosis not present

## 2018-05-19 DIAGNOSIS — J449 Chronic obstructive pulmonary disease, unspecified: Secondary | ICD-10-CM | POA: Diagnosis not present

## 2018-05-19 DIAGNOSIS — M419 Scoliosis, unspecified: Secondary | ICD-10-CM | POA: Diagnosis not present

## 2018-05-22 DIAGNOSIS — I69322 Dysarthria following cerebral infarction: Secondary | ICD-10-CM | POA: Diagnosis not present

## 2018-05-22 DIAGNOSIS — N3020 Other chronic cystitis without hematuria: Secondary | ICD-10-CM | POA: Diagnosis not present

## 2018-05-22 DIAGNOSIS — I69354 Hemiplegia and hemiparesis following cerebral infarction affecting left non-dominant side: Secondary | ICD-10-CM | POA: Diagnosis not present

## 2018-05-22 DIAGNOSIS — R1310 Dysphagia, unspecified: Secondary | ICD-10-CM | POA: Diagnosis not present

## 2018-05-22 DIAGNOSIS — I69320 Aphasia following cerebral infarction: Secondary | ICD-10-CM | POA: Diagnosis not present

## 2018-05-22 DIAGNOSIS — I69391 Dysphagia following cerebral infarction: Secondary | ICD-10-CM | POA: Diagnosis not present

## 2018-05-23 DIAGNOSIS — I69354 Hemiplegia and hemiparesis following cerebral infarction affecting left non-dominant side: Secondary | ICD-10-CM | POA: Diagnosis not present

## 2018-05-23 DIAGNOSIS — I69320 Aphasia following cerebral infarction: Secondary | ICD-10-CM | POA: Diagnosis not present

## 2018-05-23 DIAGNOSIS — R1310 Dysphagia, unspecified: Secondary | ICD-10-CM | POA: Diagnosis not present

## 2018-05-23 DIAGNOSIS — I69391 Dysphagia following cerebral infarction: Secondary | ICD-10-CM | POA: Diagnosis not present

## 2018-05-23 DIAGNOSIS — I69322 Dysarthria following cerebral infarction: Secondary | ICD-10-CM | POA: Diagnosis not present

## 2018-05-23 DIAGNOSIS — N3020 Other chronic cystitis without hematuria: Secondary | ICD-10-CM | POA: Diagnosis not present

## 2018-05-24 DIAGNOSIS — I69320 Aphasia following cerebral infarction: Secondary | ICD-10-CM | POA: Diagnosis not present

## 2018-05-24 DIAGNOSIS — I69391 Dysphagia following cerebral infarction: Secondary | ICD-10-CM | POA: Diagnosis not present

## 2018-05-24 DIAGNOSIS — R1310 Dysphagia, unspecified: Secondary | ICD-10-CM | POA: Diagnosis not present

## 2018-05-24 DIAGNOSIS — N3020 Other chronic cystitis without hematuria: Secondary | ICD-10-CM | POA: Diagnosis not present

## 2018-05-24 DIAGNOSIS — I69322 Dysarthria following cerebral infarction: Secondary | ICD-10-CM | POA: Diagnosis not present

## 2018-05-24 DIAGNOSIS — I69354 Hemiplegia and hemiparesis following cerebral infarction affecting left non-dominant side: Secondary | ICD-10-CM | POA: Diagnosis not present

## 2018-05-25 DIAGNOSIS — I69354 Hemiplegia and hemiparesis following cerebral infarction affecting left non-dominant side: Secondary | ICD-10-CM | POA: Diagnosis not present

## 2018-05-25 DIAGNOSIS — I69322 Dysarthria following cerebral infarction: Secondary | ICD-10-CM | POA: Diagnosis not present

## 2018-05-25 DIAGNOSIS — R1310 Dysphagia, unspecified: Secondary | ICD-10-CM | POA: Diagnosis not present

## 2018-05-25 DIAGNOSIS — N3020 Other chronic cystitis without hematuria: Secondary | ICD-10-CM | POA: Diagnosis not present

## 2018-05-25 DIAGNOSIS — I69391 Dysphagia following cerebral infarction: Secondary | ICD-10-CM | POA: Diagnosis not present

## 2018-05-25 DIAGNOSIS — I69320 Aphasia following cerebral infarction: Secondary | ICD-10-CM | POA: Diagnosis not present

## 2018-05-29 DIAGNOSIS — R1310 Dysphagia, unspecified: Secondary | ICD-10-CM | POA: Diagnosis not present

## 2018-05-29 DIAGNOSIS — I69322 Dysarthria following cerebral infarction: Secondary | ICD-10-CM | POA: Diagnosis not present

## 2018-05-29 DIAGNOSIS — I69391 Dysphagia following cerebral infarction: Secondary | ICD-10-CM | POA: Diagnosis not present

## 2018-05-29 DIAGNOSIS — I69354 Hemiplegia and hemiparesis following cerebral infarction affecting left non-dominant side: Secondary | ICD-10-CM | POA: Diagnosis not present

## 2018-05-29 DIAGNOSIS — I69320 Aphasia following cerebral infarction: Secondary | ICD-10-CM | POA: Diagnosis not present

## 2018-05-29 DIAGNOSIS — N3020 Other chronic cystitis without hematuria: Secondary | ICD-10-CM | POA: Diagnosis not present

## 2018-05-30 DIAGNOSIS — I69354 Hemiplegia and hemiparesis following cerebral infarction affecting left non-dominant side: Secondary | ICD-10-CM | POA: Diagnosis not present

## 2018-05-30 DIAGNOSIS — I69322 Dysarthria following cerebral infarction: Secondary | ICD-10-CM | POA: Diagnosis not present

## 2018-05-30 DIAGNOSIS — N3020 Other chronic cystitis without hematuria: Secondary | ICD-10-CM | POA: Diagnosis not present

## 2018-05-30 DIAGNOSIS — R1310 Dysphagia, unspecified: Secondary | ICD-10-CM | POA: Diagnosis not present

## 2018-05-30 DIAGNOSIS — I69391 Dysphagia following cerebral infarction: Secondary | ICD-10-CM | POA: Diagnosis not present

## 2018-05-30 DIAGNOSIS — I69320 Aphasia following cerebral infarction: Secondary | ICD-10-CM | POA: Diagnosis not present

## 2018-05-31 DIAGNOSIS — N3020 Other chronic cystitis without hematuria: Secondary | ICD-10-CM | POA: Diagnosis not present

## 2018-05-31 DIAGNOSIS — I69320 Aphasia following cerebral infarction: Secondary | ICD-10-CM | POA: Diagnosis not present

## 2018-05-31 DIAGNOSIS — I69322 Dysarthria following cerebral infarction: Secondary | ICD-10-CM | POA: Diagnosis not present

## 2018-05-31 DIAGNOSIS — I69391 Dysphagia following cerebral infarction: Secondary | ICD-10-CM | POA: Diagnosis not present

## 2018-05-31 DIAGNOSIS — R1310 Dysphagia, unspecified: Secondary | ICD-10-CM | POA: Diagnosis not present

## 2018-05-31 DIAGNOSIS — I69354 Hemiplegia and hemiparesis following cerebral infarction affecting left non-dominant side: Secondary | ICD-10-CM | POA: Diagnosis not present

## 2018-06-01 DIAGNOSIS — R1310 Dysphagia, unspecified: Secondary | ICD-10-CM | POA: Diagnosis not present

## 2018-06-01 DIAGNOSIS — I69354 Hemiplegia and hemiparesis following cerebral infarction affecting left non-dominant side: Secondary | ICD-10-CM | POA: Diagnosis not present

## 2018-06-01 DIAGNOSIS — I69320 Aphasia following cerebral infarction: Secondary | ICD-10-CM | POA: Diagnosis not present

## 2018-06-01 DIAGNOSIS — N3020 Other chronic cystitis without hematuria: Secondary | ICD-10-CM | POA: Diagnosis not present

## 2018-06-01 DIAGNOSIS — I69391 Dysphagia following cerebral infarction: Secondary | ICD-10-CM | POA: Diagnosis not present

## 2018-06-01 DIAGNOSIS — I69322 Dysarthria following cerebral infarction: Secondary | ICD-10-CM | POA: Diagnosis not present

## 2018-06-02 DIAGNOSIS — N3020 Other chronic cystitis without hematuria: Secondary | ICD-10-CM | POA: Diagnosis not present

## 2018-06-02 DIAGNOSIS — I69322 Dysarthria following cerebral infarction: Secondary | ICD-10-CM | POA: Diagnosis not present

## 2018-06-02 DIAGNOSIS — I69354 Hemiplegia and hemiparesis following cerebral infarction affecting left non-dominant side: Secondary | ICD-10-CM | POA: Diagnosis not present

## 2018-06-02 DIAGNOSIS — I69391 Dysphagia following cerebral infarction: Secondary | ICD-10-CM | POA: Diagnosis not present

## 2018-06-02 DIAGNOSIS — I69320 Aphasia following cerebral infarction: Secondary | ICD-10-CM | POA: Diagnosis not present

## 2018-06-02 DIAGNOSIS — R1310 Dysphagia, unspecified: Secondary | ICD-10-CM | POA: Diagnosis not present

## 2018-06-06 DIAGNOSIS — I69320 Aphasia following cerebral infarction: Secondary | ICD-10-CM | POA: Diagnosis not present

## 2018-06-06 DIAGNOSIS — I69391 Dysphagia following cerebral infarction: Secondary | ICD-10-CM | POA: Diagnosis not present

## 2018-06-06 DIAGNOSIS — R1310 Dysphagia, unspecified: Secondary | ICD-10-CM | POA: Diagnosis not present

## 2018-06-06 DIAGNOSIS — N3020 Other chronic cystitis without hematuria: Secondary | ICD-10-CM | POA: Diagnosis not present

## 2018-06-06 DIAGNOSIS — I69322 Dysarthria following cerebral infarction: Secondary | ICD-10-CM | POA: Diagnosis not present

## 2018-06-06 DIAGNOSIS — I69354 Hemiplegia and hemiparesis following cerebral infarction affecting left non-dominant side: Secondary | ICD-10-CM | POA: Diagnosis not present

## 2018-06-07 DIAGNOSIS — I69322 Dysarthria following cerebral infarction: Secondary | ICD-10-CM | POA: Diagnosis not present

## 2018-06-07 DIAGNOSIS — I69354 Hemiplegia and hemiparesis following cerebral infarction affecting left non-dominant side: Secondary | ICD-10-CM | POA: Diagnosis not present

## 2018-06-07 DIAGNOSIS — R1310 Dysphagia, unspecified: Secondary | ICD-10-CM | POA: Diagnosis not present

## 2018-06-07 DIAGNOSIS — I69320 Aphasia following cerebral infarction: Secondary | ICD-10-CM | POA: Diagnosis not present

## 2018-06-07 DIAGNOSIS — I69391 Dysphagia following cerebral infarction: Secondary | ICD-10-CM | POA: Diagnosis not present

## 2018-06-07 DIAGNOSIS — N3020 Other chronic cystitis without hematuria: Secondary | ICD-10-CM | POA: Diagnosis not present

## 2018-06-08 DIAGNOSIS — R1310 Dysphagia, unspecified: Secondary | ICD-10-CM | POA: Diagnosis not present

## 2018-06-08 DIAGNOSIS — I69320 Aphasia following cerebral infarction: Secondary | ICD-10-CM | POA: Diagnosis not present

## 2018-06-08 DIAGNOSIS — N3020 Other chronic cystitis without hematuria: Secondary | ICD-10-CM | POA: Diagnosis not present

## 2018-06-08 DIAGNOSIS — I69391 Dysphagia following cerebral infarction: Secondary | ICD-10-CM | POA: Diagnosis not present

## 2018-06-08 DIAGNOSIS — I69354 Hemiplegia and hemiparesis following cerebral infarction affecting left non-dominant side: Secondary | ICD-10-CM | POA: Diagnosis not present

## 2018-06-08 DIAGNOSIS — I69322 Dysarthria following cerebral infarction: Secondary | ICD-10-CM | POA: Diagnosis not present

## 2018-06-12 DIAGNOSIS — I69320 Aphasia following cerebral infarction: Secondary | ICD-10-CM | POA: Diagnosis not present

## 2018-06-12 DIAGNOSIS — I69354 Hemiplegia and hemiparesis following cerebral infarction affecting left non-dominant side: Secondary | ICD-10-CM | POA: Diagnosis not present

## 2018-06-12 DIAGNOSIS — I69391 Dysphagia following cerebral infarction: Secondary | ICD-10-CM | POA: Diagnosis not present

## 2018-06-12 DIAGNOSIS — N3020 Other chronic cystitis without hematuria: Secondary | ICD-10-CM | POA: Diagnosis not present

## 2018-06-12 DIAGNOSIS — R1310 Dysphagia, unspecified: Secondary | ICD-10-CM | POA: Diagnosis not present

## 2018-06-12 DIAGNOSIS — I69322 Dysarthria following cerebral infarction: Secondary | ICD-10-CM | POA: Diagnosis not present

## 2018-06-13 DIAGNOSIS — N3020 Other chronic cystitis without hematuria: Secondary | ICD-10-CM | POA: Diagnosis not present

## 2018-06-13 DIAGNOSIS — I69354 Hemiplegia and hemiparesis following cerebral infarction affecting left non-dominant side: Secondary | ICD-10-CM | POA: Diagnosis not present

## 2018-06-13 DIAGNOSIS — R1310 Dysphagia, unspecified: Secondary | ICD-10-CM | POA: Diagnosis not present

## 2018-06-13 DIAGNOSIS — I69320 Aphasia following cerebral infarction: Secondary | ICD-10-CM | POA: Diagnosis not present

## 2018-06-13 DIAGNOSIS — I69322 Dysarthria following cerebral infarction: Secondary | ICD-10-CM | POA: Diagnosis not present

## 2018-06-13 DIAGNOSIS — I69391 Dysphagia following cerebral infarction: Secondary | ICD-10-CM | POA: Diagnosis not present

## 2018-06-14 DIAGNOSIS — N3946 Mixed incontinence: Secondary | ICD-10-CM | POA: Diagnosis not present

## 2018-06-14 DIAGNOSIS — N390 Urinary tract infection, site not specified: Secondary | ICD-10-CM | POA: Diagnosis not present

## 2018-06-14 DIAGNOSIS — R1310 Dysphagia, unspecified: Secondary | ICD-10-CM | POA: Diagnosis not present

## 2018-06-14 DIAGNOSIS — I69354 Hemiplegia and hemiparesis following cerebral infarction affecting left non-dominant side: Secondary | ICD-10-CM | POA: Diagnosis not present

## 2018-06-14 DIAGNOSIS — I69322 Dysarthria following cerebral infarction: Secondary | ICD-10-CM | POA: Diagnosis not present

## 2018-06-14 DIAGNOSIS — I69320 Aphasia following cerebral infarction: Secondary | ICD-10-CM | POA: Diagnosis not present

## 2018-06-14 DIAGNOSIS — I69391 Dysphagia following cerebral infarction: Secondary | ICD-10-CM | POA: Diagnosis not present

## 2018-06-14 DIAGNOSIS — N3020 Other chronic cystitis without hematuria: Secondary | ICD-10-CM | POA: Diagnosis not present

## 2018-06-15 DIAGNOSIS — I69354 Hemiplegia and hemiparesis following cerebral infarction affecting left non-dominant side: Secondary | ICD-10-CM | POA: Diagnosis not present

## 2018-06-15 DIAGNOSIS — I69322 Dysarthria following cerebral infarction: Secondary | ICD-10-CM | POA: Diagnosis not present

## 2018-06-15 DIAGNOSIS — N3020 Other chronic cystitis without hematuria: Secondary | ICD-10-CM | POA: Diagnosis not present

## 2018-06-15 DIAGNOSIS — R1310 Dysphagia, unspecified: Secondary | ICD-10-CM | POA: Diagnosis not present

## 2018-06-15 DIAGNOSIS — I69320 Aphasia following cerebral infarction: Secondary | ICD-10-CM | POA: Diagnosis not present

## 2018-06-15 DIAGNOSIS — I69391 Dysphagia following cerebral infarction: Secondary | ICD-10-CM | POA: Diagnosis not present

## 2018-06-20 DIAGNOSIS — N3020 Other chronic cystitis without hematuria: Secondary | ICD-10-CM | POA: Diagnosis not present

## 2018-06-20 DIAGNOSIS — I69354 Hemiplegia and hemiparesis following cerebral infarction affecting left non-dominant side: Secondary | ICD-10-CM | POA: Diagnosis not present

## 2018-06-20 DIAGNOSIS — I69322 Dysarthria following cerebral infarction: Secondary | ICD-10-CM | POA: Diagnosis not present

## 2018-06-20 DIAGNOSIS — I69320 Aphasia following cerebral infarction: Secondary | ICD-10-CM | POA: Diagnosis not present

## 2018-06-20 DIAGNOSIS — R1310 Dysphagia, unspecified: Secondary | ICD-10-CM | POA: Diagnosis not present

## 2018-06-20 DIAGNOSIS — I69391 Dysphagia following cerebral infarction: Secondary | ICD-10-CM | POA: Diagnosis not present

## 2018-06-21 DIAGNOSIS — I69320 Aphasia following cerebral infarction: Secondary | ICD-10-CM | POA: Diagnosis not present

## 2018-06-21 DIAGNOSIS — R1310 Dysphagia, unspecified: Secondary | ICD-10-CM | POA: Diagnosis not present

## 2018-06-21 DIAGNOSIS — N3020 Other chronic cystitis without hematuria: Secondary | ICD-10-CM | POA: Diagnosis not present

## 2018-06-21 DIAGNOSIS — I69391 Dysphagia following cerebral infarction: Secondary | ICD-10-CM | POA: Diagnosis not present

## 2018-06-21 DIAGNOSIS — I69322 Dysarthria following cerebral infarction: Secondary | ICD-10-CM | POA: Diagnosis not present

## 2018-06-21 DIAGNOSIS — I69354 Hemiplegia and hemiparesis following cerebral infarction affecting left non-dominant side: Secondary | ICD-10-CM | POA: Diagnosis not present

## 2018-06-22 DIAGNOSIS — I69354 Hemiplegia and hemiparesis following cerebral infarction affecting left non-dominant side: Secondary | ICD-10-CM | POA: Diagnosis not present

## 2018-06-22 DIAGNOSIS — I69391 Dysphagia following cerebral infarction: Secondary | ICD-10-CM | POA: Diagnosis not present

## 2018-06-22 DIAGNOSIS — I69322 Dysarthria following cerebral infarction: Secondary | ICD-10-CM | POA: Diagnosis not present

## 2018-06-22 DIAGNOSIS — N3020 Other chronic cystitis without hematuria: Secondary | ICD-10-CM | POA: Diagnosis not present

## 2018-06-22 DIAGNOSIS — I69320 Aphasia following cerebral infarction: Secondary | ICD-10-CM | POA: Diagnosis not present

## 2018-06-22 DIAGNOSIS — R1310 Dysphagia, unspecified: Secondary | ICD-10-CM | POA: Diagnosis not present

## 2018-06-23 DIAGNOSIS — N3020 Other chronic cystitis without hematuria: Secondary | ICD-10-CM | POA: Diagnosis not present

## 2018-06-23 DIAGNOSIS — I69354 Hemiplegia and hemiparesis following cerebral infarction affecting left non-dominant side: Secondary | ICD-10-CM | POA: Diagnosis not present

## 2018-06-23 DIAGNOSIS — R1310 Dysphagia, unspecified: Secondary | ICD-10-CM | POA: Diagnosis not present

## 2018-06-23 DIAGNOSIS — I69391 Dysphagia following cerebral infarction: Secondary | ICD-10-CM | POA: Diagnosis not present

## 2018-06-23 DIAGNOSIS — I69322 Dysarthria following cerebral infarction: Secondary | ICD-10-CM | POA: Diagnosis not present

## 2018-06-23 DIAGNOSIS — I69320 Aphasia following cerebral infarction: Secondary | ICD-10-CM | POA: Diagnosis not present

## 2018-06-26 DIAGNOSIS — I69320 Aphasia following cerebral infarction: Secondary | ICD-10-CM | POA: Diagnosis not present

## 2018-06-26 DIAGNOSIS — N3281 Overactive bladder: Secondary | ICD-10-CM | POA: Diagnosis not present

## 2018-06-26 DIAGNOSIS — R32 Unspecified urinary incontinence: Secondary | ICD-10-CM | POA: Diagnosis not present

## 2018-06-26 DIAGNOSIS — I69354 Hemiplegia and hemiparesis following cerebral infarction affecting left non-dominant side: Secondary | ICD-10-CM | POA: Diagnosis not present

## 2018-06-26 DIAGNOSIS — N3020 Other chronic cystitis without hematuria: Secondary | ICD-10-CM | POA: Diagnosis not present

## 2018-06-26 DIAGNOSIS — I69391 Dysphagia following cerebral infarction: Secondary | ICD-10-CM | POA: Diagnosis not present

## 2018-06-26 DIAGNOSIS — R1310 Dysphagia, unspecified: Secondary | ICD-10-CM | POA: Diagnosis not present

## 2018-06-26 DIAGNOSIS — I69322 Dysarthria following cerebral infarction: Secondary | ICD-10-CM | POA: Diagnosis not present

## 2018-06-30 DIAGNOSIS — I69391 Dysphagia following cerebral infarction: Secondary | ICD-10-CM | POA: Diagnosis not present

## 2018-06-30 DIAGNOSIS — I69322 Dysarthria following cerebral infarction: Secondary | ICD-10-CM | POA: Diagnosis not present

## 2018-06-30 DIAGNOSIS — N3020 Other chronic cystitis without hematuria: Secondary | ICD-10-CM | POA: Diagnosis not present

## 2018-06-30 DIAGNOSIS — I69320 Aphasia following cerebral infarction: Secondary | ICD-10-CM | POA: Diagnosis not present

## 2018-06-30 DIAGNOSIS — R1310 Dysphagia, unspecified: Secondary | ICD-10-CM | POA: Diagnosis not present

## 2018-06-30 DIAGNOSIS — I69354 Hemiplegia and hemiparesis following cerebral infarction affecting left non-dominant side: Secondary | ICD-10-CM | POA: Diagnosis not present

## 2018-07-04 DIAGNOSIS — I69320 Aphasia following cerebral infarction: Secondary | ICD-10-CM | POA: Diagnosis not present

## 2018-07-04 DIAGNOSIS — I69322 Dysarthria following cerebral infarction: Secondary | ICD-10-CM | POA: Diagnosis not present

## 2018-07-04 DIAGNOSIS — N3020 Other chronic cystitis without hematuria: Secondary | ICD-10-CM | POA: Diagnosis not present

## 2018-07-04 DIAGNOSIS — I69354 Hemiplegia and hemiparesis following cerebral infarction affecting left non-dominant side: Secondary | ICD-10-CM | POA: Diagnosis not present

## 2018-07-04 DIAGNOSIS — I69391 Dysphagia following cerebral infarction: Secondary | ICD-10-CM | POA: Diagnosis not present

## 2018-07-04 DIAGNOSIS — R1310 Dysphagia, unspecified: Secondary | ICD-10-CM | POA: Diagnosis not present

## 2018-07-06 DIAGNOSIS — I69320 Aphasia following cerebral infarction: Secondary | ICD-10-CM | POA: Diagnosis not present

## 2018-07-06 DIAGNOSIS — I69322 Dysarthria following cerebral infarction: Secondary | ICD-10-CM | POA: Diagnosis not present

## 2018-07-06 DIAGNOSIS — R1310 Dysphagia, unspecified: Secondary | ICD-10-CM | POA: Diagnosis not present

## 2018-07-06 DIAGNOSIS — I69391 Dysphagia following cerebral infarction: Secondary | ICD-10-CM | POA: Diagnosis not present

## 2018-07-06 DIAGNOSIS — I69354 Hemiplegia and hemiparesis following cerebral infarction affecting left non-dominant side: Secondary | ICD-10-CM | POA: Diagnosis not present

## 2018-07-06 DIAGNOSIS — N3020 Other chronic cystitis without hematuria: Secondary | ICD-10-CM | POA: Diagnosis not present

## 2018-07-11 DIAGNOSIS — I69322 Dysarthria following cerebral infarction: Secondary | ICD-10-CM | POA: Diagnosis not present

## 2018-07-11 DIAGNOSIS — I69391 Dysphagia following cerebral infarction: Secondary | ICD-10-CM | POA: Diagnosis not present

## 2018-07-11 DIAGNOSIS — R1310 Dysphagia, unspecified: Secondary | ICD-10-CM | POA: Diagnosis not present

## 2018-07-11 DIAGNOSIS — I69320 Aphasia following cerebral infarction: Secondary | ICD-10-CM | POA: Diagnosis not present

## 2018-07-11 DIAGNOSIS — N3020 Other chronic cystitis without hematuria: Secondary | ICD-10-CM | POA: Diagnosis not present

## 2018-07-11 DIAGNOSIS — I69354 Hemiplegia and hemiparesis following cerebral infarction affecting left non-dominant side: Secondary | ICD-10-CM | POA: Diagnosis not present

## 2018-07-14 DIAGNOSIS — I69322 Dysarthria following cerebral infarction: Secondary | ICD-10-CM | POA: Diagnosis not present

## 2018-07-14 DIAGNOSIS — I69391 Dysphagia following cerebral infarction: Secondary | ICD-10-CM | POA: Diagnosis not present

## 2018-07-14 DIAGNOSIS — I69320 Aphasia following cerebral infarction: Secondary | ICD-10-CM | POA: Diagnosis not present

## 2018-07-14 DIAGNOSIS — N3020 Other chronic cystitis without hematuria: Secondary | ICD-10-CM | POA: Diagnosis not present

## 2018-07-14 DIAGNOSIS — I69354 Hemiplegia and hemiparesis following cerebral infarction affecting left non-dominant side: Secondary | ICD-10-CM | POA: Diagnosis not present

## 2018-07-14 DIAGNOSIS — R1310 Dysphagia, unspecified: Secondary | ICD-10-CM | POA: Diagnosis not present

## 2018-07-19 DIAGNOSIS — Z09 Encounter for follow-up examination after completed treatment for conditions other than malignant neoplasm: Secondary | ICD-10-CM | POA: Diagnosis not present

## 2018-07-19 DIAGNOSIS — Z9862 Peripheral vascular angioplasty status: Secondary | ICD-10-CM | POA: Diagnosis not present

## 2018-07-19 DIAGNOSIS — I6521 Occlusion and stenosis of right carotid artery: Secondary | ICD-10-CM | POA: Diagnosis not present

## 2018-07-19 DIAGNOSIS — I129 Hypertensive chronic kidney disease with stage 1 through stage 4 chronic kidney disease, or unspecified chronic kidney disease: Secondary | ICD-10-CM | POA: Diagnosis not present

## 2018-07-19 DIAGNOSIS — Z95828 Presence of other vascular implants and grafts: Secondary | ICD-10-CM | POA: Diagnosis not present

## 2018-07-19 DIAGNOSIS — I6629 Occlusion and stenosis of unspecified posterior cerebral artery: Secondary | ICD-10-CM | POA: Diagnosis not present

## 2018-07-19 DIAGNOSIS — I6601 Occlusion and stenosis of right middle cerebral artery: Secondary | ICD-10-CM | POA: Diagnosis not present

## 2018-07-19 DIAGNOSIS — N183 Chronic kidney disease, stage 3 (moderate): Secondary | ICD-10-CM | POA: Diagnosis not present

## 2018-07-19 DIAGNOSIS — Z7982 Long term (current) use of aspirin: Secondary | ICD-10-CM | POA: Diagnosis not present

## 2018-07-19 DIAGNOSIS — Z9889 Other specified postprocedural states: Secondary | ICD-10-CM | POA: Diagnosis not present

## 2018-07-19 DIAGNOSIS — Z87891 Personal history of nicotine dependence: Secondary | ICD-10-CM | POA: Diagnosis not present

## 2018-07-20 DIAGNOSIS — Z23 Encounter for immunization: Secondary | ICD-10-CM | POA: Diagnosis not present

## 2018-07-24 DIAGNOSIS — Z1339 Encounter for screening examination for other mental health and behavioral disorders: Secondary | ICD-10-CM | POA: Diagnosis not present

## 2018-07-24 DIAGNOSIS — Z79899 Other long term (current) drug therapy: Secondary | ICD-10-CM | POA: Diagnosis not present

## 2018-07-24 DIAGNOSIS — E785 Hyperlipidemia, unspecified: Secondary | ICD-10-CM | POA: Diagnosis not present

## 2018-07-24 DIAGNOSIS — I639 Cerebral infarction, unspecified: Secondary | ICD-10-CM | POA: Diagnosis not present

## 2018-07-24 DIAGNOSIS — I10 Essential (primary) hypertension: Secondary | ICD-10-CM | POA: Diagnosis not present

## 2018-07-24 DIAGNOSIS — M5430 Sciatica, unspecified side: Secondary | ICD-10-CM | POA: Diagnosis not present

## 2018-07-24 DIAGNOSIS — Z9181 History of falling: Secondary | ICD-10-CM | POA: Diagnosis not present

## 2018-08-01 DIAGNOSIS — M79672 Pain in left foot: Secondary | ICD-10-CM | POA: Diagnosis not present

## 2018-08-01 DIAGNOSIS — B351 Tinea unguium: Secondary | ICD-10-CM | POA: Diagnosis not present

## 2018-08-02 DIAGNOSIS — M6281 Muscle weakness (generalized): Secondary | ICD-10-CM | POA: Diagnosis not present

## 2018-08-02 DIAGNOSIS — R2689 Other abnormalities of gait and mobility: Secondary | ICD-10-CM | POA: Diagnosis not present

## 2018-08-02 DIAGNOSIS — R2990 Unspecified symptoms and signs involving the nervous system: Secondary | ICD-10-CM | POA: Diagnosis not present

## 2018-08-02 DIAGNOSIS — R2681 Unsteadiness on feet: Secondary | ICD-10-CM | POA: Diagnosis not present

## 2018-08-08 DIAGNOSIS — S0230XA Fracture of orbital floor, unspecified side, initial encounter for closed fracture: Secondary | ICD-10-CM | POA: Diagnosis not present

## 2018-08-08 DIAGNOSIS — M79605 Pain in left leg: Secondary | ICD-10-CM | POA: Diagnosis not present

## 2018-08-08 DIAGNOSIS — R0902 Hypoxemia: Secondary | ICD-10-CM | POA: Diagnosis not present

## 2018-08-08 DIAGNOSIS — M25562 Pain in left knee: Secondary | ICD-10-CM | POA: Diagnosis not present

## 2018-08-08 DIAGNOSIS — S8992XA Unspecified injury of left lower leg, initial encounter: Secondary | ICD-10-CM | POA: Diagnosis not present

## 2018-08-08 DIAGNOSIS — W19XXXA Unspecified fall, initial encounter: Secondary | ICD-10-CM | POA: Diagnosis not present

## 2018-08-08 DIAGNOSIS — S8012XA Contusion of left lower leg, initial encounter: Secondary | ICD-10-CM | POA: Diagnosis not present

## 2018-08-08 DIAGNOSIS — S299XXA Unspecified injury of thorax, initial encounter: Secondary | ICD-10-CM | POA: Diagnosis not present

## 2018-08-08 DIAGNOSIS — S199XXA Unspecified injury of neck, initial encounter: Secondary | ICD-10-CM | POA: Diagnosis not present

## 2018-08-08 DIAGNOSIS — S0003XA Contusion of scalp, initial encounter: Secondary | ICD-10-CM | POA: Diagnosis not present

## 2018-08-08 DIAGNOSIS — S81012A Laceration without foreign body, left knee, initial encounter: Secondary | ICD-10-CM | POA: Diagnosis not present

## 2018-08-08 DIAGNOSIS — S0231XA Fracture of orbital floor, right side, initial encounter for closed fracture: Secondary | ICD-10-CM | POA: Diagnosis not present

## 2018-08-08 DIAGNOSIS — K219 Gastro-esophageal reflux disease without esophagitis: Secondary | ICD-10-CM | POA: Diagnosis not present

## 2018-08-08 DIAGNOSIS — J449 Chronic obstructive pulmonary disease, unspecified: Secondary | ICD-10-CM | POA: Diagnosis not present

## 2018-08-08 DIAGNOSIS — S0990XA Unspecified injury of head, initial encounter: Secondary | ICD-10-CM | POA: Diagnosis not present

## 2018-08-08 DIAGNOSIS — E7800 Pure hypercholesterolemia, unspecified: Secondary | ICD-10-CM | POA: Diagnosis not present

## 2018-08-08 DIAGNOSIS — Z87891 Personal history of nicotine dependence: Secondary | ICD-10-CM | POA: Diagnosis not present

## 2018-08-08 DIAGNOSIS — Z8673 Personal history of transient ischemic attack (TIA), and cerebral infarction without residual deficits: Secondary | ICD-10-CM | POA: Diagnosis not present

## 2018-08-11 DIAGNOSIS — G5601 Carpal tunnel syndrome, right upper limb: Secondary | ICD-10-CM | POA: Diagnosis not present

## 2018-08-11 DIAGNOSIS — Z736 Limitation of activities due to disability: Secondary | ICD-10-CM | POA: Diagnosis not present

## 2018-08-11 DIAGNOSIS — M6281 Muscle weakness (generalized): Secondary | ICD-10-CM | POA: Diagnosis not present

## 2018-08-11 DIAGNOSIS — R2681 Unsteadiness on feet: Secondary | ICD-10-CM | POA: Diagnosis not present

## 2018-08-11 DIAGNOSIS — R2689 Other abnormalities of gait and mobility: Secondary | ICD-10-CM | POA: Diagnosis not present

## 2018-08-11 DIAGNOSIS — R2990 Unspecified symptoms and signs involving the nervous system: Secondary | ICD-10-CM | POA: Diagnosis not present

## 2018-08-13 DIAGNOSIS — R0902 Hypoxemia: Secondary | ICD-10-CM | POA: Diagnosis not present

## 2018-08-13 DIAGNOSIS — J9809 Other diseases of bronchus, not elsewhere classified: Secondary | ICD-10-CM | POA: Diagnosis not present

## 2018-08-13 DIAGNOSIS — R0602 Shortness of breath: Secondary | ICD-10-CM | POA: Diagnosis not present

## 2018-08-13 DIAGNOSIS — J441 Chronic obstructive pulmonary disease with (acute) exacerbation: Secondary | ICD-10-CM | POA: Diagnosis not present

## 2018-08-13 DIAGNOSIS — R05 Cough: Secondary | ICD-10-CM | POA: Diagnosis not present

## 2018-08-13 DIAGNOSIS — I10 Essential (primary) hypertension: Secondary | ICD-10-CM | POA: Diagnosis not present

## 2018-08-13 DIAGNOSIS — J9601 Acute respiratory failure with hypoxia: Secondary | ICD-10-CM | POA: Diagnosis not present

## 2018-08-14 DIAGNOSIS — R633 Feeding difficulties: Secondary | ICD-10-CM | POA: Diagnosis not present

## 2018-08-14 DIAGNOSIS — Z743 Need for continuous supervision: Secondary | ICD-10-CM | POA: Diagnosis not present

## 2018-08-14 DIAGNOSIS — Z87440 Personal history of urinary (tract) infections: Secondary | ICD-10-CM | POA: Diagnosis not present

## 2018-08-14 DIAGNOSIS — Z8673 Personal history of transient ischemic attack (TIA), and cerebral infarction without residual deficits: Secondary | ICD-10-CM | POA: Diagnosis not present

## 2018-08-14 DIAGNOSIS — Z87891 Personal history of nicotine dependence: Secondary | ICD-10-CM | POA: Diagnosis not present

## 2018-08-14 DIAGNOSIS — R918 Other nonspecific abnormal finding of lung field: Secondary | ICD-10-CM | POA: Diagnosis not present

## 2018-08-14 DIAGNOSIS — I4510 Unspecified right bundle-branch block: Secondary | ICD-10-CM | POA: Diagnosis not present

## 2018-08-14 DIAGNOSIS — I6350 Cerebral infarction due to unspecified occlusion or stenosis of unspecified cerebral artery: Secondary | ICD-10-CM | POA: Diagnosis not present

## 2018-08-14 DIAGNOSIS — Z91040 Latex allergy status: Secondary | ICD-10-CM | POA: Diagnosis not present

## 2018-08-14 DIAGNOSIS — J441 Chronic obstructive pulmonary disease with (acute) exacerbation: Secondary | ICD-10-CM | POA: Diagnosis not present

## 2018-08-14 DIAGNOSIS — R0600 Dyspnea, unspecified: Secondary | ICD-10-CM | POA: Diagnosis not present

## 2018-08-14 DIAGNOSIS — J9809 Other diseases of bronchus, not elsewhere classified: Secondary | ICD-10-CM | POA: Diagnosis present

## 2018-08-14 DIAGNOSIS — M1990 Unspecified osteoarthritis, unspecified site: Secondary | ICD-10-CM | POA: Diagnosis present

## 2018-08-14 DIAGNOSIS — F17290 Nicotine dependence, other tobacco product, uncomplicated: Secondary | ICD-10-CM | POA: Diagnosis not present

## 2018-08-14 DIAGNOSIS — I129 Hypertensive chronic kidney disease with stage 1 through stage 4 chronic kidney disease, or unspecified chronic kidney disease: Secondary | ICD-10-CM | POA: Diagnosis present

## 2018-08-14 DIAGNOSIS — R2689 Other abnormalities of gait and mobility: Secondary | ICD-10-CM | POA: Diagnosis not present

## 2018-08-14 DIAGNOSIS — Z9689 Presence of other specified functional implants: Secondary | ICD-10-CM | POA: Diagnosis present

## 2018-08-14 DIAGNOSIS — Z955 Presence of coronary angioplasty implant and graft: Secondary | ICD-10-CM | POA: Diagnosis not present

## 2018-08-14 DIAGNOSIS — Z741 Need for assistance with personal care: Secondary | ICD-10-CM | POA: Diagnosis not present

## 2018-08-14 DIAGNOSIS — D649 Anemia, unspecified: Secondary | ICD-10-CM | POA: Diagnosis not present

## 2018-08-14 DIAGNOSIS — G629 Polyneuropathy, unspecified: Secondary | ICD-10-CM | POA: Diagnosis present

## 2018-08-14 DIAGNOSIS — J984 Other disorders of lung: Secondary | ICD-10-CM | POA: Diagnosis present

## 2018-08-14 DIAGNOSIS — I491 Atrial premature depolarization: Secondary | ICD-10-CM | POA: Diagnosis not present

## 2018-08-14 DIAGNOSIS — I10 Essential (primary) hypertension: Secondary | ICD-10-CM | POA: Diagnosis not present

## 2018-08-14 DIAGNOSIS — N390 Urinary tract infection, site not specified: Secondary | ICD-10-CM | POA: Diagnosis not present

## 2018-08-14 DIAGNOSIS — Z7902 Long term (current) use of antithrombotics/antiplatelets: Secondary | ICD-10-CM | POA: Diagnosis not present

## 2018-08-14 DIAGNOSIS — R296 Repeated falls: Secondary | ICD-10-CM | POA: Diagnosis not present

## 2018-08-14 DIAGNOSIS — R41841 Cognitive communication deficit: Secondary | ICD-10-CM | POA: Diagnosis not present

## 2018-08-14 DIAGNOSIS — N189 Chronic kidney disease, unspecified: Secondary | ICD-10-CM | POA: Diagnosis not present

## 2018-08-14 DIAGNOSIS — J988 Other specified respiratory disorders: Secondary | ICD-10-CM | POA: Diagnosis not present

## 2018-08-14 DIAGNOSIS — Z7982 Long term (current) use of aspirin: Secondary | ICD-10-CM | POA: Diagnosis not present

## 2018-08-14 DIAGNOSIS — F329 Major depressive disorder, single episode, unspecified: Secondary | ICD-10-CM | POA: Diagnosis present

## 2018-08-14 DIAGNOSIS — R1310 Dysphagia, unspecified: Secondary | ICD-10-CM | POA: Diagnosis not present

## 2018-08-14 DIAGNOSIS — R531 Weakness: Secondary | ICD-10-CM | POA: Diagnosis not present

## 2018-08-14 DIAGNOSIS — I493 Ventricular premature depolarization: Secondary | ICD-10-CM | POA: Diagnosis not present

## 2018-08-14 DIAGNOSIS — R410 Disorientation, unspecified: Secondary | ICD-10-CM | POA: Diagnosis not present

## 2018-08-14 DIAGNOSIS — R262 Difficulty in walking, not elsewhere classified: Secondary | ICD-10-CM | POA: Diagnosis not present

## 2018-08-14 DIAGNOSIS — Z886 Allergy status to analgesic agent status: Secondary | ICD-10-CM | POA: Diagnosis not present

## 2018-08-14 DIAGNOSIS — R279 Unspecified lack of coordination: Secondary | ICD-10-CM | POA: Diagnosis not present

## 2018-08-14 DIAGNOSIS — J9819 Other pulmonary collapse: Secondary | ICD-10-CM | POA: Diagnosis present

## 2018-08-14 DIAGNOSIS — M6259 Muscle wasting and atrophy, not elsewhere classified, multiple sites: Secondary | ICD-10-CM | POA: Diagnosis not present

## 2018-08-14 DIAGNOSIS — J449 Chronic obstructive pulmonary disease, unspecified: Secondary | ICD-10-CM | POA: Diagnosis not present

## 2018-08-14 DIAGNOSIS — G459 Transient cerebral ischemic attack, unspecified: Secondary | ICD-10-CM | POA: Diagnosis not present

## 2018-08-14 DIAGNOSIS — K219 Gastro-esophageal reflux disease without esophagitis: Secondary | ICD-10-CM | POA: Diagnosis present

## 2018-08-14 DIAGNOSIS — R9431 Abnormal electrocardiogram [ECG] [EKG]: Secondary | ICD-10-CM | POA: Diagnosis not present

## 2018-08-14 DIAGNOSIS — M810 Age-related osteoporosis without current pathological fracture: Secondary | ICD-10-CM | POA: Diagnosis not present

## 2018-08-14 DIAGNOSIS — Z79899 Other long term (current) drug therapy: Secondary | ICD-10-CM | POA: Diagnosis not present

## 2018-08-14 DIAGNOSIS — Z7951 Long term (current) use of inhaled steroids: Secondary | ICD-10-CM | POA: Diagnosis not present

## 2018-08-14 DIAGNOSIS — R0602 Shortness of breath: Secondary | ICD-10-CM | POA: Diagnosis not present

## 2018-08-14 DIAGNOSIS — J9691 Respiratory failure, unspecified with hypoxia: Secondary | ICD-10-CM | POA: Diagnosis not present

## 2018-08-14 DIAGNOSIS — N183 Chronic kidney disease, stage 3 (moderate): Secondary | ICD-10-CM | POA: Diagnosis present

## 2018-08-14 DIAGNOSIS — J9601 Acute respiratory failure with hypoxia: Secondary | ICD-10-CM | POA: Diagnosis present

## 2018-08-14 DIAGNOSIS — E785 Hyperlipidemia, unspecified: Secondary | ICD-10-CM | POA: Diagnosis present

## 2018-08-15 DIAGNOSIS — R0602 Shortness of breath: Secondary | ICD-10-CM | POA: Diagnosis not present

## 2018-08-15 DIAGNOSIS — J984 Other disorders of lung: Secondary | ICD-10-CM | POA: Diagnosis not present

## 2018-08-15 DIAGNOSIS — J449 Chronic obstructive pulmonary disease, unspecified: Secondary | ICD-10-CM | POA: Diagnosis not present

## 2018-08-17 DIAGNOSIS — R279 Unspecified lack of coordination: Secondary | ICD-10-CM | POA: Diagnosis not present

## 2018-08-17 DIAGNOSIS — J9611 Chronic respiratory failure with hypoxia: Secondary | ICD-10-CM | POA: Diagnosis not present

## 2018-08-17 DIAGNOSIS — F329 Major depressive disorder, single episode, unspecified: Secondary | ICD-10-CM | POA: Diagnosis not present

## 2018-08-17 DIAGNOSIS — Z741 Need for assistance with personal care: Secondary | ICD-10-CM | POA: Diagnosis not present

## 2018-08-17 DIAGNOSIS — M1990 Unspecified osteoarthritis, unspecified site: Secondary | ICD-10-CM | POA: Diagnosis not present

## 2018-08-17 DIAGNOSIS — R41841 Cognitive communication deficit: Secondary | ICD-10-CM | POA: Diagnosis not present

## 2018-08-17 DIAGNOSIS — R1310 Dysphagia, unspecified: Secondary | ICD-10-CM | POA: Diagnosis not present

## 2018-08-17 DIAGNOSIS — R262 Difficulty in walking, not elsewhere classified: Secondary | ICD-10-CM | POA: Diagnosis not present

## 2018-08-17 DIAGNOSIS — R2689 Other abnormalities of gait and mobility: Secondary | ICD-10-CM | POA: Diagnosis not present

## 2018-08-17 DIAGNOSIS — R410 Disorientation, unspecified: Secondary | ICD-10-CM | POA: Diagnosis not present

## 2018-08-17 DIAGNOSIS — J9819 Other pulmonary collapse: Secondary | ICD-10-CM | POA: Diagnosis not present

## 2018-08-17 DIAGNOSIS — F17290 Nicotine dependence, other tobacco product, uncomplicated: Secondary | ICD-10-CM | POA: Diagnosis not present

## 2018-08-17 DIAGNOSIS — R633 Feeding difficulties: Secondary | ICD-10-CM | POA: Diagnosis not present

## 2018-08-17 DIAGNOSIS — G459 Transient cerebral ischemic attack, unspecified: Secondary | ICD-10-CM | POA: Diagnosis not present

## 2018-08-17 DIAGNOSIS — Z743 Need for continuous supervision: Secondary | ICD-10-CM | POA: Diagnosis not present

## 2018-08-17 DIAGNOSIS — E785 Hyperlipidemia, unspecified: Secondary | ICD-10-CM | POA: Diagnosis not present

## 2018-08-17 DIAGNOSIS — M810 Age-related osteoporosis without current pathological fracture: Secondary | ICD-10-CM | POA: Diagnosis not present

## 2018-08-17 DIAGNOSIS — J9809 Other diseases of bronchus, not elsewhere classified: Secondary | ICD-10-CM | POA: Diagnosis not present

## 2018-08-17 DIAGNOSIS — I10 Essential (primary) hypertension: Secondary | ICD-10-CM | POA: Diagnosis not present

## 2018-08-17 DIAGNOSIS — D649 Anemia, unspecified: Secondary | ICD-10-CM | POA: Diagnosis not present

## 2018-08-17 DIAGNOSIS — K219 Gastro-esophageal reflux disease without esophagitis: Secondary | ICD-10-CM | POA: Diagnosis not present

## 2018-08-17 DIAGNOSIS — R296 Repeated falls: Secondary | ICD-10-CM | POA: Diagnosis not present

## 2018-08-17 DIAGNOSIS — Z87440 Personal history of urinary (tract) infections: Secondary | ICD-10-CM | POA: Diagnosis not present

## 2018-08-17 DIAGNOSIS — M6259 Muscle wasting and atrophy, not elsewhere classified, multiple sites: Secondary | ICD-10-CM | POA: Diagnosis not present

## 2018-08-17 DIAGNOSIS — I6350 Cerebral infarction due to unspecified occlusion or stenosis of unspecified cerebral artery: Secondary | ICD-10-CM | POA: Diagnosis not present

## 2018-08-17 DIAGNOSIS — J9691 Respiratory failure, unspecified with hypoxia: Secondary | ICD-10-CM | POA: Diagnosis not present

## 2018-08-17 DIAGNOSIS — I639 Cerebral infarction, unspecified: Secondary | ICD-10-CM | POA: Diagnosis not present

## 2018-08-17 DIAGNOSIS — R0600 Dyspnea, unspecified: Secondary | ICD-10-CM | POA: Diagnosis not present

## 2018-08-17 DIAGNOSIS — J9601 Acute respiratory failure with hypoxia: Secondary | ICD-10-CM | POA: Diagnosis not present

## 2018-08-17 DIAGNOSIS — J441 Chronic obstructive pulmonary disease with (acute) exacerbation: Secondary | ICD-10-CM | POA: Diagnosis not present

## 2018-08-17 DIAGNOSIS — R531 Weakness: Secondary | ICD-10-CM | POA: Diagnosis not present

## 2018-08-17 DIAGNOSIS — G629 Polyneuropathy, unspecified: Secondary | ICD-10-CM | POA: Diagnosis not present

## 2018-08-18 DIAGNOSIS — J9611 Chronic respiratory failure with hypoxia: Secondary | ICD-10-CM | POA: Diagnosis not present

## 2018-08-18 DIAGNOSIS — J441 Chronic obstructive pulmonary disease with (acute) exacerbation: Secondary | ICD-10-CM | POA: Diagnosis not present

## 2018-08-18 DIAGNOSIS — I639 Cerebral infarction, unspecified: Secondary | ICD-10-CM | POA: Diagnosis not present

## 2018-08-18 DIAGNOSIS — R262 Difficulty in walking, not elsewhere classified: Secondary | ICD-10-CM | POA: Diagnosis not present

## 2018-09-06 DIAGNOSIS — J441 Chronic obstructive pulmonary disease with (acute) exacerbation: Secondary | ICD-10-CM | POA: Diagnosis not present

## 2018-09-06 DIAGNOSIS — Z7951 Long term (current) use of inhaled steroids: Secondary | ICD-10-CM | POA: Diagnosis not present

## 2018-09-06 DIAGNOSIS — J440 Chronic obstructive pulmonary disease with acute lower respiratory infection: Secondary | ICD-10-CM | POA: Diagnosis not present

## 2018-09-06 DIAGNOSIS — R41841 Cognitive communication deficit: Secondary | ICD-10-CM | POA: Diagnosis not present

## 2018-09-06 DIAGNOSIS — I739 Peripheral vascular disease, unspecified: Secondary | ICD-10-CM | POA: Diagnosis not present

## 2018-09-06 DIAGNOSIS — M419 Scoliosis, unspecified: Secondary | ICD-10-CM | POA: Diagnosis not present

## 2018-09-06 DIAGNOSIS — J9621 Acute and chronic respiratory failure with hypoxia: Secondary | ICD-10-CM | POA: Diagnosis not present

## 2018-09-06 DIAGNOSIS — N183 Chronic kidney disease, stage 3 (moderate): Secondary | ICD-10-CM | POA: Diagnosis not present

## 2018-09-06 DIAGNOSIS — Z8673 Personal history of transient ischemic attack (TIA), and cerebral infarction without residual deficits: Secondary | ICD-10-CM | POA: Diagnosis not present

## 2018-09-06 DIAGNOSIS — E785 Hyperlipidemia, unspecified: Secondary | ICD-10-CM | POA: Diagnosis not present

## 2018-09-06 DIAGNOSIS — M810 Age-related osteoporosis without current pathological fracture: Secondary | ICD-10-CM | POA: Diagnosis not present

## 2018-09-06 DIAGNOSIS — K219 Gastro-esophageal reflux disease without esophagitis: Secondary | ICD-10-CM | POA: Diagnosis not present

## 2018-09-06 DIAGNOSIS — D631 Anemia in chronic kidney disease: Secondary | ICD-10-CM | POA: Diagnosis not present

## 2018-09-06 DIAGNOSIS — I129 Hypertensive chronic kidney disease with stage 1 through stage 4 chronic kidney disease, or unspecified chronic kidney disease: Secondary | ICD-10-CM | POA: Diagnosis not present

## 2018-09-06 DIAGNOSIS — Z7982 Long term (current) use of aspirin: Secondary | ICD-10-CM | POA: Diagnosis not present

## 2018-09-06 DIAGNOSIS — E559 Vitamin D deficiency, unspecified: Secondary | ICD-10-CM | POA: Diagnosis not present

## 2018-09-06 DIAGNOSIS — Z87891 Personal history of nicotine dependence: Secondary | ICD-10-CM | POA: Diagnosis not present

## 2018-09-06 DIAGNOSIS — M1991 Primary osteoarthritis, unspecified site: Secondary | ICD-10-CM | POA: Diagnosis not present

## 2018-09-06 DIAGNOSIS — Z96653 Presence of artificial knee joint, bilateral: Secondary | ICD-10-CM | POA: Diagnosis not present

## 2018-09-06 DIAGNOSIS — F329 Major depressive disorder, single episode, unspecified: Secondary | ICD-10-CM | POA: Diagnosis not present

## 2018-09-06 DIAGNOSIS — G629 Polyneuropathy, unspecified: Secondary | ICD-10-CM | POA: Diagnosis not present

## 2018-09-06 DIAGNOSIS — R1310 Dysphagia, unspecified: Secondary | ICD-10-CM | POA: Diagnosis not present

## 2018-09-06 DIAGNOSIS — Z9181 History of falling: Secondary | ICD-10-CM | POA: Diagnosis not present

## 2018-09-06 DIAGNOSIS — M4800 Spinal stenosis, site unspecified: Secondary | ICD-10-CM | POA: Diagnosis not present

## 2018-09-06 DIAGNOSIS — J189 Pneumonia, unspecified organism: Secondary | ICD-10-CM | POA: Diagnosis not present

## 2018-09-08 DIAGNOSIS — J440 Chronic obstructive pulmonary disease with acute lower respiratory infection: Secondary | ICD-10-CM | POA: Diagnosis not present

## 2018-09-08 DIAGNOSIS — N183 Chronic kidney disease, stage 3 (moderate): Secondary | ICD-10-CM | POA: Diagnosis not present

## 2018-09-08 DIAGNOSIS — J441 Chronic obstructive pulmonary disease with (acute) exacerbation: Secondary | ICD-10-CM | POA: Diagnosis not present

## 2018-09-08 DIAGNOSIS — J189 Pneumonia, unspecified organism: Secondary | ICD-10-CM | POA: Diagnosis not present

## 2018-09-08 DIAGNOSIS — J9621 Acute and chronic respiratory failure with hypoxia: Secondary | ICD-10-CM | POA: Diagnosis not present

## 2018-09-08 DIAGNOSIS — I129 Hypertensive chronic kidney disease with stage 1 through stage 4 chronic kidney disease, or unspecified chronic kidney disease: Secondary | ICD-10-CM | POA: Diagnosis not present

## 2018-09-12 DIAGNOSIS — J441 Chronic obstructive pulmonary disease with (acute) exacerbation: Secondary | ICD-10-CM | POA: Diagnosis not present

## 2018-09-12 DIAGNOSIS — J9621 Acute and chronic respiratory failure with hypoxia: Secondary | ICD-10-CM | POA: Diagnosis not present

## 2018-09-12 DIAGNOSIS — I129 Hypertensive chronic kidney disease with stage 1 through stage 4 chronic kidney disease, or unspecified chronic kidney disease: Secondary | ICD-10-CM | POA: Diagnosis not present

## 2018-09-12 DIAGNOSIS — N183 Chronic kidney disease, stage 3 (moderate): Secondary | ICD-10-CM | POA: Diagnosis not present

## 2018-09-12 DIAGNOSIS — J440 Chronic obstructive pulmonary disease with acute lower respiratory infection: Secondary | ICD-10-CM | POA: Diagnosis not present

## 2018-09-12 DIAGNOSIS — J189 Pneumonia, unspecified organism: Secondary | ICD-10-CM | POA: Diagnosis not present

## 2018-09-15 DIAGNOSIS — N183 Chronic kidney disease, stage 3 (moderate): Secondary | ICD-10-CM | POA: Diagnosis not present

## 2018-09-15 DIAGNOSIS — J441 Chronic obstructive pulmonary disease with (acute) exacerbation: Secondary | ICD-10-CM | POA: Diagnosis not present

## 2018-09-15 DIAGNOSIS — I129 Hypertensive chronic kidney disease with stage 1 through stage 4 chronic kidney disease, or unspecified chronic kidney disease: Secondary | ICD-10-CM | POA: Diagnosis not present

## 2018-09-15 DIAGNOSIS — J9621 Acute and chronic respiratory failure with hypoxia: Secondary | ICD-10-CM | POA: Diagnosis not present

## 2018-09-15 DIAGNOSIS — J189 Pneumonia, unspecified organism: Secondary | ICD-10-CM | POA: Diagnosis not present

## 2018-09-15 DIAGNOSIS — J440 Chronic obstructive pulmonary disease with acute lower respiratory infection: Secondary | ICD-10-CM | POA: Diagnosis not present

## 2018-09-19 DIAGNOSIS — N183 Chronic kidney disease, stage 3 (moderate): Secondary | ICD-10-CM | POA: Diagnosis not present

## 2018-09-19 DIAGNOSIS — J189 Pneumonia, unspecified organism: Secondary | ICD-10-CM | POA: Diagnosis not present

## 2018-09-19 DIAGNOSIS — I129 Hypertensive chronic kidney disease with stage 1 through stage 4 chronic kidney disease, or unspecified chronic kidney disease: Secondary | ICD-10-CM | POA: Diagnosis not present

## 2018-09-19 DIAGNOSIS — J440 Chronic obstructive pulmonary disease with acute lower respiratory infection: Secondary | ICD-10-CM | POA: Diagnosis not present

## 2018-09-19 DIAGNOSIS — J9621 Acute and chronic respiratory failure with hypoxia: Secondary | ICD-10-CM | POA: Diagnosis not present

## 2018-09-19 DIAGNOSIS — J441 Chronic obstructive pulmonary disease with (acute) exacerbation: Secondary | ICD-10-CM | POA: Diagnosis not present

## 2018-09-26 DIAGNOSIS — N183 Chronic kidney disease, stage 3 (moderate): Secondary | ICD-10-CM | POA: Diagnosis not present

## 2018-09-26 DIAGNOSIS — J441 Chronic obstructive pulmonary disease with (acute) exacerbation: Secondary | ICD-10-CM | POA: Diagnosis not present

## 2018-09-26 DIAGNOSIS — J189 Pneumonia, unspecified organism: Secondary | ICD-10-CM | POA: Diagnosis not present

## 2018-09-26 DIAGNOSIS — I129 Hypertensive chronic kidney disease with stage 1 through stage 4 chronic kidney disease, or unspecified chronic kidney disease: Secondary | ICD-10-CM | POA: Diagnosis not present

## 2018-09-26 DIAGNOSIS — J9621 Acute and chronic respiratory failure with hypoxia: Secondary | ICD-10-CM | POA: Diagnosis not present

## 2018-09-26 DIAGNOSIS — J440 Chronic obstructive pulmonary disease with acute lower respiratory infection: Secondary | ICD-10-CM | POA: Diagnosis not present

## 2018-09-29 DIAGNOSIS — J80 Acute respiratory distress syndrome: Secondary | ICD-10-CM | POA: Diagnosis not present

## 2018-09-29 DIAGNOSIS — J441 Chronic obstructive pulmonary disease with (acute) exacerbation: Secondary | ICD-10-CM | POA: Diagnosis not present

## 2018-09-29 DIAGNOSIS — J9601 Acute respiratory failure with hypoxia: Secondary | ICD-10-CM | POA: Diagnosis not present

## 2018-09-29 DIAGNOSIS — R0902 Hypoxemia: Secondary | ICD-10-CM | POA: Diagnosis not present

## 2018-09-29 DIAGNOSIS — R069 Unspecified abnormalities of breathing: Secondary | ICD-10-CM | POA: Diagnosis not present

## 2018-09-29 DIAGNOSIS — R0602 Shortness of breath: Secondary | ICD-10-CM | POA: Diagnosis not present

## 2018-09-29 DIAGNOSIS — R05 Cough: Secondary | ICD-10-CM | POA: Diagnosis not present

## 2018-09-29 DIAGNOSIS — I5031 Acute diastolic (congestive) heart failure: Secondary | ICD-10-CM | POA: Diagnosis not present

## 2018-09-29 DIAGNOSIS — I110 Hypertensive heart disease with heart failure: Secondary | ICD-10-CM | POA: Diagnosis not present

## 2018-09-30 DIAGNOSIS — R0602 Shortness of breath: Secondary | ICD-10-CM | POA: Diagnosis not present

## 2018-09-30 DIAGNOSIS — N393 Stress incontinence (female) (male): Secondary | ICD-10-CM | POA: Diagnosis present

## 2018-09-30 DIAGNOSIS — Z888 Allergy status to other drugs, medicaments and biological substances status: Secondary | ICD-10-CM | POA: Diagnosis not present

## 2018-09-30 DIAGNOSIS — K219 Gastro-esophageal reflux disease without esophagitis: Secondary | ICD-10-CM | POA: Diagnosis present

## 2018-09-30 DIAGNOSIS — J441 Chronic obstructive pulmonary disease with (acute) exacerbation: Secondary | ICD-10-CM | POA: Diagnosis not present

## 2018-09-30 DIAGNOSIS — Z7982 Long term (current) use of aspirin: Secondary | ICD-10-CM | POA: Diagnosis not present

## 2018-09-30 DIAGNOSIS — Z87891 Personal history of nicotine dependence: Secondary | ICD-10-CM | POA: Diagnosis not present

## 2018-09-30 DIAGNOSIS — Z79899 Other long term (current) drug therapy: Secondary | ICD-10-CM | POA: Diagnosis not present

## 2018-09-30 DIAGNOSIS — R05 Cough: Secondary | ICD-10-CM | POA: Diagnosis not present

## 2018-09-30 DIAGNOSIS — I69954 Hemiplegia and hemiparesis following unspecified cerebrovascular disease affecting left non-dominant side: Secondary | ICD-10-CM | POA: Diagnosis not present

## 2018-09-30 DIAGNOSIS — J9601 Acute respiratory failure with hypoxia: Secondary | ICD-10-CM | POA: Diagnosis present

## 2018-09-30 DIAGNOSIS — I5031 Acute diastolic (congestive) heart failure: Secondary | ICD-10-CM | POA: Diagnosis present

## 2018-09-30 DIAGNOSIS — E7800 Pure hypercholesterolemia, unspecified: Secondary | ICD-10-CM | POA: Diagnosis present

## 2018-09-30 DIAGNOSIS — R062 Wheezing: Secondary | ICD-10-CM | POA: Diagnosis not present

## 2018-09-30 DIAGNOSIS — Z8701 Personal history of pneumonia (recurrent): Secondary | ICD-10-CM | POA: Diagnosis not present

## 2018-09-30 DIAGNOSIS — I110 Hypertensive heart disease with heart failure: Secondary | ICD-10-CM | POA: Diagnosis present

## 2018-09-30 DIAGNOSIS — R1310 Dysphagia, unspecified: Secondary | ICD-10-CM | POA: Diagnosis present

## 2018-10-01 ENCOUNTER — Inpatient Hospital Stay (HOSPITAL_COMMUNITY)
Admission: AD | Admit: 2018-10-01 | Discharge: 2018-10-09 | DRG: 208 | Disposition: A | Discharge status: Skilled Nursing Facility | Payer: Medicare Other | Source: Other Acute Inpatient Hospital | Attending: Internal Medicine | Admitting: Internal Medicine

## 2018-10-01 ENCOUNTER — Inpatient Hospital Stay (HOSPITAL_COMMUNITY): Payer: Medicare Other

## 2018-10-01 DIAGNOSIS — J9600 Acute respiratory failure, unspecified whether with hypoxia or hypercapnia: Secondary | ICD-10-CM | POA: Diagnosis not present

## 2018-10-01 DIAGNOSIS — R1310 Dysphagia, unspecified: Secondary | ICD-10-CM | POA: Diagnosis present

## 2018-10-01 DIAGNOSIS — J449 Chronic obstructive pulmonary disease, unspecified: Secondary | ICD-10-CM | POA: Diagnosis present

## 2018-10-01 DIAGNOSIS — M1990 Unspecified osteoarthritis, unspecified site: Secondary | ICD-10-CM | POA: Diagnosis present

## 2018-10-01 DIAGNOSIS — Z91040 Latex allergy status: Secondary | ICD-10-CM

## 2018-10-01 DIAGNOSIS — E876 Hypokalemia: Secondary | ICD-10-CM | POA: Diagnosis present

## 2018-10-01 DIAGNOSIS — N189 Chronic kidney disease, unspecified: Secondary | ICD-10-CM | POA: Diagnosis not present

## 2018-10-01 DIAGNOSIS — Z8619 Personal history of other infectious and parasitic diseases: Secondary | ICD-10-CM | POA: Diagnosis not present

## 2018-10-01 DIAGNOSIS — I69354 Hemiplegia and hemiparesis following cerebral infarction affecting left non-dominant side: Secondary | ICD-10-CM | POA: Diagnosis not present

## 2018-10-01 DIAGNOSIS — R062 Wheezing: Secondary | ICD-10-CM | POA: Diagnosis not present

## 2018-10-01 DIAGNOSIS — Z79899 Other long term (current) drug therapy: Secondary | ICD-10-CM

## 2018-10-01 DIAGNOSIS — R1312 Dysphagia, oropharyngeal phase: Secondary | ICD-10-CM | POA: Diagnosis not present

## 2018-10-01 DIAGNOSIS — J9811 Atelectasis: Secondary | ICD-10-CM | POA: Diagnosis present

## 2018-10-01 DIAGNOSIS — Y92239 Unspecified place in hospital as the place of occurrence of the external cause: Secondary | ICD-10-CM | POA: Diagnosis not present

## 2018-10-01 DIAGNOSIS — E785 Hyperlipidemia, unspecified: Secondary | ICD-10-CM | POA: Diagnosis present

## 2018-10-01 DIAGNOSIS — T41295A Adverse effect of other general anesthetics, initial encounter: Secondary | ICD-10-CM | POA: Diagnosis not present

## 2018-10-01 DIAGNOSIS — R0602 Shortness of breath: Secondary | ICD-10-CM | POA: Diagnosis not present

## 2018-10-01 DIAGNOSIS — G4733 Obstructive sleep apnea (adult) (pediatric): Secondary | ICD-10-CM | POA: Diagnosis present

## 2018-10-01 DIAGNOSIS — X58XXXA Exposure to other specified factors, initial encounter: Secondary | ICD-10-CM | POA: Diagnosis present

## 2018-10-01 DIAGNOSIS — F329 Major depressive disorder, single episode, unspecified: Secondary | ICD-10-CM | POA: Diagnosis present

## 2018-10-01 DIAGNOSIS — J398 Other specified diseases of upper respiratory tract: Secondary | ICD-10-CM | POA: Diagnosis not present

## 2018-10-01 DIAGNOSIS — K219 Gastro-esophageal reflux disease without esophagitis: Secondary | ICD-10-CM | POA: Diagnosis present

## 2018-10-01 DIAGNOSIS — Z79891 Long term (current) use of opiate analgesic: Secondary | ICD-10-CM

## 2018-10-01 DIAGNOSIS — I5031 Acute diastolic (congestive) heart failure: Secondary | ICD-10-CM | POA: Diagnosis not present

## 2018-10-01 DIAGNOSIS — Z9911 Dependence on respirator [ventilator] status: Secondary | ICD-10-CM | POA: Diagnosis not present

## 2018-10-01 DIAGNOSIS — J9690 Respiratory failure, unspecified, unspecified whether with hypoxia or hypercapnia: Secondary | ICD-10-CM | POA: Diagnosis not present

## 2018-10-01 DIAGNOSIS — M6281 Muscle weakness (generalized): Secondary | ICD-10-CM | POA: Diagnosis not present

## 2018-10-01 DIAGNOSIS — Z87891 Personal history of nicotine dependence: Secondary | ICD-10-CM | POA: Diagnosis not present

## 2018-10-01 DIAGNOSIS — I110 Hypertensive heart disease with heart failure: Secondary | ICD-10-CM | POA: Diagnosis not present

## 2018-10-01 DIAGNOSIS — J189 Pneumonia, unspecified organism: Secondary | ICD-10-CM | POA: Diagnosis not present

## 2018-10-01 DIAGNOSIS — R0989 Other specified symptoms and signs involving the circulatory and respiratory systems: Secondary | ICD-10-CM | POA: Diagnosis not present

## 2018-10-01 DIAGNOSIS — Z4682 Encounter for fitting and adjustment of non-vascular catheter: Secondary | ICD-10-CM | POA: Diagnosis not present

## 2018-10-01 DIAGNOSIS — R278 Other lack of coordination: Secondary | ICD-10-CM | POA: Diagnosis not present

## 2018-10-01 DIAGNOSIS — N393 Stress incontinence (female) (male): Secondary | ICD-10-CM | POA: Diagnosis not present

## 2018-10-01 DIAGNOSIS — E8770 Fluid overload, unspecified: Secondary | ICD-10-CM | POA: Diagnosis present

## 2018-10-01 DIAGNOSIS — J690 Pneumonitis due to inhalation of food and vomit: Secondary | ICD-10-CM | POA: Diagnosis present

## 2018-10-01 DIAGNOSIS — F419 Anxiety disorder, unspecified: Secondary | ICD-10-CM | POA: Diagnosis present

## 2018-10-01 DIAGNOSIS — G4700 Insomnia, unspecified: Secondary | ICD-10-CM | POA: Diagnosis present

## 2018-10-01 DIAGNOSIS — I69954 Hemiplegia and hemiparesis following unspecified cerebrovascular disease affecting left non-dominant side: Secondary | ICD-10-CM | POA: Diagnosis not present

## 2018-10-01 DIAGNOSIS — I952 Hypotension due to drugs: Secondary | ICD-10-CM | POA: Diagnosis not present

## 2018-10-01 DIAGNOSIS — R262 Difficulty in walking, not elsewhere classified: Secondary | ICD-10-CM | POA: Diagnosis not present

## 2018-10-01 DIAGNOSIS — M2550 Pain in unspecified joint: Secondary | ICD-10-CM | POA: Diagnosis not present

## 2018-10-01 DIAGNOSIS — I10 Essential (primary) hypertension: Secondary | ICD-10-CM | POA: Diagnosis present

## 2018-10-01 DIAGNOSIS — Z7409 Other reduced mobility: Secondary | ICD-10-CM | POA: Diagnosis not present

## 2018-10-01 DIAGNOSIS — Z791 Long term (current) use of non-steroidal anti-inflammatories (NSAID): Secondary | ICD-10-CM

## 2018-10-01 DIAGNOSIS — R0609 Other forms of dyspnea: Secondary | ICD-10-CM | POA: Diagnosis not present

## 2018-10-01 DIAGNOSIS — Z7982 Long term (current) use of aspirin: Secondary | ICD-10-CM | POA: Diagnosis not present

## 2018-10-01 DIAGNOSIS — J9601 Acute respiratory failure with hypoxia: Secondary | ICD-10-CM | POA: Diagnosis present

## 2018-10-01 DIAGNOSIS — Z96653 Presence of artificial knee joint, bilateral: Secondary | ICD-10-CM | POA: Diagnosis present

## 2018-10-01 DIAGNOSIS — I69321 Dysphasia following cerebral infarction: Secondary | ICD-10-CM

## 2018-10-01 DIAGNOSIS — R2681 Unsteadiness on feet: Secondary | ICD-10-CM | POA: Diagnosis not present

## 2018-10-01 DIAGNOSIS — I959 Hypotension, unspecified: Secondary | ICD-10-CM | POA: Diagnosis not present

## 2018-10-01 DIAGNOSIS — Z7951 Long term (current) use of inhaled steroids: Secondary | ICD-10-CM

## 2018-10-01 DIAGNOSIS — Z7401 Bed confinement status: Secondary | ICD-10-CM | POA: Diagnosis not present

## 2018-10-01 DIAGNOSIS — J440 Chronic obstructive pulmonary disease with acute lower respiratory infection: Secondary | ICD-10-CM | POA: Diagnosis not present

## 2018-10-01 DIAGNOSIS — T17990A Other foreign object in respiratory tract, part unspecified in causing asphyxiation, initial encounter: Secondary | ICD-10-CM | POA: Diagnosis present

## 2018-10-01 LAB — POCT I-STAT 3, ART BLOOD GAS (G3+)
Acid-Base Excess: 6 mmol/L — ABNORMAL HIGH (ref 0.0–2.0)
Bicarbonate: 32.4 mmol/L — ABNORMAL HIGH (ref 20.0–28.0)
O2 Saturation: 95 %
Patient temperature: 98.6
TCO2: 34 mmol/L — AB (ref 22–32)
pCO2 arterial: 51.3 mmHg — ABNORMAL HIGH (ref 32.0–48.0)
pH, Arterial: 7.408 (ref 7.350–7.450)
pO2, Arterial: 75 mmHg — ABNORMAL LOW (ref 83.0–108.0)

## 2018-10-01 LAB — RESPIRATORY PANEL BY PCR
Adenovirus: NOT DETECTED
Bordetella pertussis: NOT DETECTED
Chlamydophila pneumoniae: NOT DETECTED
Coronavirus 229E: NOT DETECTED
Coronavirus HKU1: NOT DETECTED
Coronavirus NL63: NOT DETECTED
Coronavirus OC43: NOT DETECTED
Influenza A: NOT DETECTED
Influenza B: NOT DETECTED
Metapneumovirus: NOT DETECTED
Mycoplasma pneumoniae: NOT DETECTED
PARAINFLUENZA VIRUS 1-RVPPCR: NOT DETECTED
Parainfluenza Virus 2: NOT DETECTED
Parainfluenza Virus 3: NOT DETECTED
Parainfluenza Virus 4: NOT DETECTED
Respiratory Syncytial Virus: NOT DETECTED
Rhinovirus / Enterovirus: NOT DETECTED

## 2018-10-01 LAB — GLUCOSE, CAPILLARY
GLUCOSE-CAPILLARY: 118 mg/dL — AB (ref 70–99)
Glucose-Capillary: 135 mg/dL — ABNORMAL HIGH (ref 70–99)

## 2018-10-01 LAB — PROCALCITONIN: Procalcitonin: <0.1 ng/mL

## 2018-10-01 LAB — COMPREHENSIVE METABOLIC PANEL
ALT: 33 U/L (ref 0–44)
AST: 35 U/L (ref 15–41)
Albumin: 3.7 g/dL (ref 3.5–5.0)
Alkaline Phosphatase: 84 U/L (ref 38–126)
Anion gap: 10 (ref 5–15)
BUN: 32 mg/dL — ABNORMAL HIGH (ref 8–23)
CO2: 30 mmol/L (ref 22–32)
Calcium: 8.8 mg/dL — ABNORMAL LOW (ref 8.9–10.3)
Chloride: 101 mmol/L (ref 98–111)
Creatinine, Ser: 1.29 mg/dL — ABNORMAL HIGH (ref 0.44–1.00)
GFR calc Af Amer: 44 mL/min — ABNORMAL LOW (ref >60)
GFR calc non Af Amer: 38 mL/min — ABNORMAL LOW (ref >60)
Glucose, Bld: 134 mg/dL — ABNORMAL HIGH (ref 70–99)
Potassium: 3.9 mmol/L (ref 3.5–5.1)
Sodium: 141 mmol/L (ref 135–145)
Total Bilirubin: 0.7 mg/dL (ref 0.3–1.2)
Total Protein: 6.4 g/dL — ABNORMAL LOW (ref 6.5–8.1)

## 2018-10-01 LAB — CBC
HCT: 37.7 % (ref 36.0–46.0)
Hemoglobin: 11.6 g/dL — ABNORMAL LOW (ref 12.0–15.0)
MCH: 28.2 pg (ref 26.0–34.0)
MCHC: 30.8 g/dL (ref 30.0–36.0)
MCV: 91.7 fL (ref 80.0–100.0)
Platelets: 243 10*3/uL (ref 150–400)
RBC: 4.11 MIL/uL (ref 3.87–5.11)
RDW: 15.5 % (ref 11.5–15.5)
WBC: 13.2 10*3/uL — ABNORMAL HIGH (ref 4.0–10.5)
nRBC: 0 % (ref 0.0–0.2)

## 2018-10-01 LAB — TRIGLYCERIDES: Triglycerides: 252 mg/dL — ABNORMAL HIGH (ref <150)

## 2018-10-01 LAB — MRSA PCR SCREENING: MRSA by PCR: NEGATIVE

## 2018-10-01 LAB — MAGNESIUM: Magnesium: 2.3 mg/dL (ref 1.7–2.4)

## 2018-10-01 MED ORDER — FENTANYL CITRATE (PF) 100 MCG/2ML IJ SOLN
50.0000 ug | INTRAMUSCULAR | Status: DC | PRN
  Administered 2018-10-01 – 2018-10-02 (×2): 50 ug via INTRAVENOUS
  Filled 2018-10-01 (×4): qty 2

## 2018-10-01 MED ORDER — IPRATROPIUM-ALBUTEROL 0.5-2.5 (3) MG/3ML IN SOLN
3.0000 mL | Freq: Four times a day (QID) | RESPIRATORY_TRACT | Status: DC
  Administered 2018-10-02 – 2018-10-04 (×12): 3 mL via RESPIRATORY_TRACT
  Filled 2018-10-01 (×12): qty 3

## 2018-10-01 MED ORDER — PROPOFOL 1000 MG/100ML IV EMUL: 0.0000 ug/kg/min | INTRAVENOUS | Status: DC

## 2018-10-01 MED ORDER — ALBUTEROL SULFATE (2.5 MG/3ML) 0.083% IN NEBU: 2.5000 mg | INHALATION_SOLUTION | RESPIRATORY_TRACT | Status: DC | PRN

## 2018-10-01 MED ORDER — CHLORHEXIDINE GLUCONATE 0.12% ORAL RINSE (MEDLINE KIT)
15.0000 mL | Freq: Two times a day (BID) | OROMUCOSAL | Status: DC
  Administered 2018-10-01 – 2018-10-02 (×3): 15 mL via OROMUCOSAL

## 2018-10-01 MED ORDER — ATORVASTATIN CALCIUM 40 MG PO TABS
40.0000 mg | ORAL_TABLET | Freq: Every day | ORAL | Status: DC
  Administered 2018-10-02 – 2018-10-08 (×7): 40 mg via ORAL
  Filled 2018-10-01 (×7): qty 1

## 2018-10-01 MED ORDER — DEXMEDETOMIDINE HCL IN NACL 400 MCG/100ML IV SOLN
0.4000 ug/kg/h | INTRAVENOUS | Status: DC
  Administered 2018-10-01: 0.5 ug/kg/h via INTRAVENOUS
  Administered 2018-10-02: 0.8 ug/kg/h via INTRAVENOUS
  Administered 2018-10-02: 1 ug/kg/h via INTRAVENOUS
  Administered 2018-10-02: 0.8 ug/kg/h via INTRAVENOUS
  Filled 2018-10-01 (×3): qty 100

## 2018-10-01 MED ORDER — INSULIN ASPART 100 UNIT/ML ~~LOC~~ SOLN
2.0000 [IU] | SUBCUTANEOUS | Status: DC
  Administered 2018-10-01 – 2018-10-03 (×3): 2 [IU] via SUBCUTANEOUS

## 2018-10-01 MED ORDER — BUDESONIDE 0.5 MG/2ML IN SUSP
0.5000 mg | Freq: Two times a day (BID) | RESPIRATORY_TRACT | Status: DC
  Administered 2018-10-02 – 2018-10-03 (×3): 0.5 mg via RESPIRATORY_TRACT
  Filled 2018-10-01 (×3): qty 2

## 2018-10-01 MED ORDER — ORAL CARE MOUTH RINSE
15.0000 mL | OROMUCOSAL | Status: DC
  Administered 2018-10-01 – 2018-10-03 (×9): 15 mL via OROMUCOSAL

## 2018-10-01 MED ORDER — ASPIRIN 81 MG PO CHEW
81.0000 mg | CHEWABLE_TABLET | Freq: Every day | ORAL | Status: DC
  Administered 2018-10-02 – 2018-10-05 (×3): 81 mg
  Filled 2018-10-01 (×3): qty 1

## 2018-10-01 MED ORDER — ARFORMOTEROL TARTRATE 15 MCG/2ML IN NEBU
15.0000 ug | INHALATION_SOLUTION | Freq: Two times a day (BID) | RESPIRATORY_TRACT | Status: DC
  Administered 2018-10-02 – 2018-10-03 (×3): 15 ug via RESPIRATORY_TRACT
  Filled 2018-10-01 (×3): qty 2

## 2018-10-01 MED ORDER — SODIUM CHLORIDE 0.9 % IV SOLN
500.0000 mg | INTRAVENOUS | Status: DC
  Administered 2018-10-02: 500 mg via INTRAVENOUS
  Filled 2018-10-01: qty 500

## 2018-10-01 MED ORDER — PANTOPRAZOLE SODIUM 40 MG PO PACK
40.0000 mg | PACK | Freq: Every day | ORAL | Status: DC
  Administered 2018-10-02 – 2018-10-05 (×3): 40 mg
  Filled 2018-10-01 (×3): qty 20

## 2018-10-01 MED ORDER — HEPARIN SODIUM (PORCINE) 5000 UNIT/ML IJ SOLN
5000.0000 [IU] | Freq: Three times a day (TID) | INTRAMUSCULAR | Status: DC
  Administered 2018-10-01 – 2018-10-09 (×23): 5000 [IU] via SUBCUTANEOUS
  Filled 2018-10-01 (×22): qty 1

## 2018-10-01 MED ORDER — FENTANYL CITRATE (PF) 100 MCG/2ML IJ SOLN
50.0000 ug | INTRAMUSCULAR | Status: DC | PRN
  Administered 2018-10-02 (×4): 50 ug via INTRAVENOUS
  Filled 2018-10-01 (×3): qty 2

## 2018-10-01 NOTE — H&P (Addendum)
NAME:  Cindy Briggs, MRN:  [id number], DOB:  1931/06/20, LOS: 0 ADMISSION DATE:  10/01/2018, CONSULTATION DATE:  10/01/2018 REFERRING MD:  Duke Salviaandolph, CHIEF COMPLAINT:  Respiratory Failure    History of present illness   84 year old female [facility] on [facility] with shortness of breath with exertion and cough. On arrival to ED CXR with low lung volumes with bibasilar atelectasis, no fever or WBC. Oxygen saturation 90% on 2L [place]. On 12/28 patient required BiPAP for hypoxia. Diuresis 10lbs with Lasix. CT Chest with persistent opacification of the bronchus intermedius, right middle and right lowe lobe concerning for mucoid impaction vs neoplasm. Given persistent hypoxia with BiPAP patient was intubated and transferred to [facility].    Reported [facility], bronchoscopy performed, cultures negative, believed to be mucous plugging. Since discharge [facility].   Past Medical History  CVA with residual left sided weakness, COPD, GERD, HTN, HLD  Significant Hospital Events   12/27 > [facility]  12/29 > Intubated, [facility]  Consults:  PCCM  Procedures:  ETT 12/29 >   Significant Diagnostic Tests:  CXR 12/29 > Mild Bibasilar Atelectasis  Micro Data:  Tracheal Asp 12/29 >>  Antimicrobials:  N/A    Interim history/subjective:  Arrived from OSH intubated/sedated   Objective   Blood pressure 127/88, pulse (!) 59, temperature 97.6 F (36.4 C), temperature source Oral, resp. rate 16, height 5\' 6"  (1.676 m), weight 81 kg, SpO2 91 %.    Vent Mode: PRVC FiO2 (%):  [85 %] 85 % Set Rate:  [16 bmp] 16 bmp Vt Set:  [520 mL] 520 mL PEEP:  [5 cmH20-8 cmH20] 8 cmH20 Plateau Pressure:  [24 cmH20-31 cmH20] 24 cmH20  No intake or output data in the 24 hours ending 10/01/18 2112 Filed Weights   10/01/18 1908  Weight: 81 kg    Examination: General: Elderly Female on vent    HENT: Dry MM, ETT in place  Lungs: Rhonchi, no wheeze/crackles  Cardiovascular: RRR, no MRG Abdomen: Soft, non-distended, active bowel sounds  Extremities: -edema  Neuro: sedated, follows commands, pupils intact  GU: foley in place   Resolved Hospital Problem list     Assessment & Plan:   Acute Hypoxic Respiratory Failure with mucous plugging with suspected Chronic Bronchitis, Family reports Chronic Cough with H/O Tobacco Use Plan -Vent Support -Trend CXR/ABG -Pulmonary Hygiene > Chest PT -Scheduled Brovana, Pulmicort, Duoneb, PRN Albuterol  -RVP pending  -Performed Lavage with copious thick secretions cleared, may need bronchoscopy in AM  -Start Azithromycin, Send Tracheal Asp and PCT  HTN, HLD ECHO 12/28 OSH > EF 60-65 with no regional wall abnormalities with impaired diastolic relaxation.   Plan  -Cardiac Monitoring -Maintain MAP >65 (Currently holding home HTN meds) -Continue Lipitor, ASA  Chronic Renal Insuffiencey (Base Crt 1.1-1.3) Volume Overload > Diuresis 10lbs at OSH  Plan -Trend BMP -Replace electrolytes as indicated  -Hold Further Diuresis at this time  GERD ?Dyshagia > Per Family patient has swallow [facility] one [facility], was discharged on Regular diet with thin liquids  Plan  -PPI -Swallow Evaluation once extubated   Sedation Needs  H/O Neuropathy  Plan  -Hypotension with Propofol > Changed to Precedex -PRN Fentanyl    Best practice:  Diet: NPO Pain/Anxiety/Delirium protocol VAP protocol DVT prophylaxis: Heparin SQ GI prophylaxis: PPI Glucose control: SSI Mobility: Bedrest  Code Status: FC Family  Communication: Family updated at bedside   Labs   CBC: Recent Labs  Lab 10/01/18 2006  WBC 13.2*  HGB 11.6*  HCT 37.7  MCV 91.7  PLT 243    Basic Metabolic Panel: Recent Labs  Lab 10/01/18 2006  NA 141  K 3.9  CL 101  CO2 30  GLUCOSE 134*  BUN 32*  CREATININE 1.29*  CALCIUM 8.8*  MG 2.3    GFR: Estimated Creatinine Clearance: 34.2 mL/min (A) (by C-G formula based on SCr of 1.29 mg/dL (H)). Recent Labs  Lab 10/01/18 2006  WBC 13.2*    Liver Function Tests: Recent Labs  Lab 10/01/18 2006  AST 35  ALT 33  ALKPHOS 84  BILITOT 0.7  PROT 6.4*  ALBUMIN 3.7   No results for input(s): LIPASE, AMYLASE in the last 168 hours. No results for input(s): AMMONIA in the last 168 hours.  ABG    Component Value Date/Time   PHART 7.408 10/01/2018 2046   PCO2ART 51.3 (H) 10/01/2018 2046   PO2ART 75.0 (L) 10/01/2018 2046   HCO3 32.4 (H) 10/01/2018 2046   TCO2 34 (H) 10/01/2018 2046   O2SAT 95.0 10/01/2018 2046     Coagulation Profile: No results for input(s): INR, PROTIME in the last 168 hours.  Cardiac Enzymes: No results for input(s): CKTOTAL, CKMB, CKMBINDEX, TROPONINI in the last 168 hours.  HbA1C: No results found for: HGBA1C  CBG: Recent Labs  Lab 10/01/18 1958  GLUCAP 135*    Review of Systems:   Unable to review as patient is intubated/sedated.   Past Medical History  She,  has a past medical history of Anxiety, Arthritis, Asthma, Bronchial asthma, Depression, GERD (gastroesophageal reflux disease), Heart murmur, Hepatitis, Hypertension, Pneumonia (15), and UTI (lower urinary tract infection).   Surgical History    Past Surgical History:  Procedure Laterality Date  . EYE SURGERY Bilateral   . hammer toes     10-15 years ago both feet  . JOINT REPLACEMENT     right knee  . TOTAL KNEE ARTHROPLASTY Left 02/07/2015   Procedure: TOTAL KNEE ARTHROPLASTY;  Surgeon: Jodi GeraldsJohn Graves, MD;  Location: [facility] OR;  Service: Orthopedics;  Laterality: Left;     Social History   reports that she quit smoking about 3 years ago. Her smoking use included cigarettes. She has a 5.00 pack-year smoking history. She does not have any smokeless tobacco history on file. She reports that she does not drink alcohol or use drugs.   Family History   Her family history is not on  file.   Allergies Allergies  Allergen Reactions  . Latex Dermatitis and Rash     Home Medications  Prior to Admission medications   Medication Sig Start Date End Date Taking? Authorizing Provider  aspirin EC 325 MG tablet Take 1 tablet (325 mg total) by mouth 2 (two) times daily after a meal. Take x 1 month post op to decrease risk of blood clots. 02/07/15   Marshia LyBethune, James, PA-C  beta carotene w/minerals (OCUVITE) tablet Take 1 tablet by mouth daily. gummies    [provider]  budesonide-formoterol (SYMBICORT) 160-4.5 MCG/ACT inhaler Inhale 1 puff into the lungs 2 (two) times daily. 01/10/15   Monica Bectonhekkekandam, Thomas J, MD  cephALEXin (KEFLEX) 250 MG capsule Take 1 capsule (250 mg total) by mouth daily. 03/13/15   Monica Bectonhekkekandam, Thomas J, MD  cephALEXin (KEFLEX) 500 MG capsule Take 1 capsule (500 mg total) by mouth 2 (two) times daily. 03/13/15   Monica Bectonhekkekandam, Thomas J,  MD  chlorthalidone (HYGROTON) 25 MG tablet Take 1 tablet (25 mg total) by mouth daily. 11/14/14   Monica Becton, MD  diphenoxylate-atropine (LOMOTIL) 2.5-0.025 MG per tablet One to 2 tablets by mouth 4 times a day as needed for diarrhea. 01/16/15   Monica Becton, MD  DULoxetine (CYMBALTA) 60 MG capsule TAKE 1 CAPSULE BY MOUTH EVERY DAY 05/29/15   Monica Becton, MD  furosemide (LASIX) 40 MG tablet TAKE 1 TABLET BY MOUTH EVERY DAY 07/30/15   Monica Becton, MD  gabapentin (NEURONTIN) 300 MG capsule SEE NOTES 05/15/15   Monica Becton, MD  guaiFENesin (MUCINEX) 600 MG 12 hr tablet Take 600 mg by mouth every 4 (four) hours as needed for cough or to loosen phlegm.     [provider]  Ipratropium-Albuterol (COMBIVENT) 20-100 MCG/ACT AERS respimat Inhale 1 puff into the lungs every 6 (six) hours as needed for wheezing or shortness of breath. 12/10/14   Monica Becton, MD  lansoprazole (PREVACID) 30 MG capsule Take 30 mg by mouth daily.     [provider]  lisinopril  (PRINIVIL,ZESTRIL) 10 MG tablet TAKE 1 TABLET BY MOUTH EVERY DAY 03/18/15   Monica Becton, MD  meloxicam (MOBIC) 15 MG tablet Take 1 tablet (15 mg total) by mouth daily. 03/19/15   Monica Becton, MD  methocarbamol (ROBAXIN) 500 MG tablet Take 1 tablet (500 mg total) by mouth every 8 (eight) hours as needed for muscle spasms. 02/07/15   Marshia Ly, PA-C  oxyCODONE-acetaminophen (PERCOCET/ROXICET) 5-325 MG per tablet Take 1 tablet by mouth 2 (two) times daily as needed for severe pain. 03/13/15   Monica Becton, MD  potassium chloride (K-DUR) 10 MEQ tablet TAKE 1 TABLET BY MOUTH TWICE DAILY 12/05/15   Monica Becton, MD  pravastatin (PRAVACHOL) 20 MG tablet TAKE 1 TABLET BY MOUTH EVERY DAY 10/27/15   Monica Becton, MD  Vitamin D, Ergocalciferol, (DRISDOL) 50000 UNITS CAPS capsule Take 50,000 Units by mouth every 7 (seven) days. Saturdays    [provider]     Critical care time: 52 minutes     Jovita Kussmaul, AGACNP-BC [facility]  PCCM Pgr: [phone]

## 2018-10-02 ENCOUNTER — Encounter (HOSPITAL_COMMUNITY): Payer: Self-pay | Admitting: *Deleted

## 2018-10-02 ENCOUNTER — Other Ambulatory Visit: Payer: Self-pay

## 2018-10-02 DIAGNOSIS — Z9911 Dependence on respirator [ventilator] status: Secondary | ICD-10-CM

## 2018-10-02 LAB — BLOOD GAS, ARTERIAL
Acid-Base Excess: 5.3 mmol/L — ABNORMAL HIGH (ref 0.0–2.0)
Bicarbonate: 28.8 mmol/L — ABNORMAL HIGH (ref 20.0–28.0)
Drawn by: 22407
FIO2: 70
MECHVT: 520 mL
O2 Saturation: 98.1 %
PCO2 ART: 38.7 mmHg (ref 32.0–48.0)
PEEP: 8 cmH2O
Patient temperature: 98.6
RATE: 16 resp/min
pH, Arterial: 7.484 — ABNORMAL HIGH (ref 7.350–7.450)
pO2, Arterial: 108 mmHg (ref 83.0–108.0)

## 2018-10-02 LAB — MAGNESIUM: Magnesium: 2.4 mg/dL (ref 1.7–2.4)

## 2018-10-02 LAB — CBC
HCT: 40.7 % (ref 36.0–46.0)
Hemoglobin: 12.4 g/dL (ref 12.0–15.0)
MCH: 27.9 pg (ref 26.0–34.0)
MCHC: 30.5 g/dL (ref 30.0–36.0)
MCV: 91.5 fL (ref 80.0–100.0)
Platelets: 207 10*3/uL (ref 150–400)
RBC: 4.45 MIL/uL (ref 3.87–5.11)
RDW: 15.3 % (ref 11.5–15.5)
WBC: 15.4 10*3/uL — AB (ref 4.0–10.5)
nRBC: 0 % (ref 0.0–0.2)

## 2018-10-02 LAB — GLUCOSE, CAPILLARY
Glucose-Capillary: 114 mg/dL — ABNORMAL HIGH (ref 70–99)
Glucose-Capillary: 111 mg/dL — ABNORMAL HIGH (ref 70–99)
Glucose-Capillary: 127 mg/dL — ABNORMAL HIGH (ref 70–99)
Glucose-Capillary: 95 mg/dL (ref 70–99)
Glucose-Capillary: 103 mg/dL — ABNORMAL HIGH (ref 70–99)

## 2018-10-02 LAB — BASIC METABOLIC PANEL
Anion gap: 12 (ref 5–15)
BUN: 35 mg/dL — ABNORMAL HIGH (ref 8–23)
CO2: 28 mmol/L (ref 22–32)
Calcium: 8.8 mg/dL — ABNORMAL LOW (ref 8.9–10.3)
Chloride: 102 mmol/L (ref 98–111)
Creatinine, Ser: 1.21 mg/dL — ABNORMAL HIGH (ref 0.44–1.00)
GFR calc Af Amer: 47 mL/min — ABNORMAL LOW (ref >60)
GFR calc non Af Amer: 41 mL/min — ABNORMAL LOW (ref >60)
Glucose, Bld: 124 mg/dL — ABNORMAL HIGH (ref 70–99)
POTASSIUM: 3.5 mmol/L (ref 3.5–5.1)
Sodium: 142 mmol/L (ref 135–145)

## 2018-10-02 LAB — PHOSPHORUS: PHOSPHORUS: 3.3 mg/dL (ref 2.5–4.6)

## 2018-10-02 MED ORDER — LACTATED RINGERS IV BOLUS
500.0000 mL | Freq: Once | INTRAVENOUS | Status: AC
  Administered 2018-10-02: 500 mL via INTRAVENOUS

## 2018-10-02 MED ORDER — FENTANYL CITRATE (PF) 100 MCG/2ML IJ SOLN
100.0000 ug | Freq: Once | INTRAMUSCULAR | Status: AC
  Administered 2018-10-02: 100 ug via INTRAVENOUS
  Filled 2018-10-02: qty 2

## 2018-10-02 MED ORDER — MIDAZOLAM HCL 2 MG/2ML IJ SOLN
2.0000 mg | Freq: Once | INTRAMUSCULAR | Status: AC
  Administered 2018-10-02: 2 mg via INTRAVENOUS

## 2018-10-02 MED ORDER — MIDAZOLAM HCL 2 MG/2ML IJ SOLN
2.0000 mg | Freq: Once | INTRAMUSCULAR | Status: AC
  Administered 2018-10-02: 2 mg via INTRAVENOUS
  Filled 2018-10-02: qty 2

## 2018-10-02 MED ORDER — PIPERACILLIN-TAZOBACTAM 3.375 G IVPB
3.3750 g | Freq: Three times a day (TID) | INTRAVENOUS | Status: DC
  Administered 2018-10-02 – 2018-10-08 (×18): 3.375 g via INTRAVENOUS
  Filled 2018-10-02 (×19): qty 50

## 2018-10-02 MED ORDER — MIDAZOLAM HCL 2 MG/2ML IJ SOLN
INTRAMUSCULAR | Status: AC
  Filled 2018-10-02: qty 2

## 2018-10-02 MED ORDER — FENTANYL CITRATE (PF) 100 MCG/2ML IJ SOLN
INTRAMUSCULAR | Status: AC
  Filled 2018-10-02: qty 2

## 2018-10-02 NOTE — Procedures (Signed)
PCCM Video Bronchoscopy Procedure Note  The patient was informed of the risks (including but not limited to bleeding, infection, respiratory failure, lung injury, tooth/oral injury) and benefits of the procedure and gave consent, see chart.  Indication: Mucus plugging airways, acute respiratory failure   Post Procedure Diagnosis: severe tracheobronchomalacia  Location: 70[facility] Medical ICU  Condition pre procedure: critically ill, on vent  Medications for procedure: versed 4mg  IV, fentanyl 100mcg IV  Procedure description: The bronchoscope was introduced through the endotracheal tube and passed to the bilateral lungs to the level of the subsegmental bronchi throughout the tracheobronchial tree.  Airway exam revealed severe tracheobronchomalacia causing complete collapse of the right trachea at the level of the endotracheal tube extending into both lungs bilaterally.  There was thick mucus which was cleared with suctioning.  Despite IV sedation the patient bit down on the endotracheal tube with such force that it broke the outer casing of the bronchoscope.  The camera was removed with some difficulty.  At this point we administered more sedation and we placed a bite block and I passed another bronchoscope to ensure there were no retained pieces of the initial bronchoscope.  Procedures performed: none  Specimens sent: none  Condition post procedure: stable  EBL: none  Complications: none immediate  Heber CarolinaBrent Farhad Burleson, MD [place] [facility] Pager: [phone] Cell: [phone] If no response, call [phone]

## 2018-10-02 NOTE — Progress Notes (Addendum)
Ventilator alarming upon entering pt room, pt self extubated while in restraints. Daughter at bedside asleep. RT called. Precedex gtt stopped.  Pt placed on NRB sats 99-100%. Elink MD called. Will continue to closely monitor patient at this time.

## 2018-10-02 NOTE — Progress Notes (Signed)
NAME:  Cindy Briggs, MRN:  [id number], DOB:  September 11, 1931, LOS: 1 ADMISSION DATE:  10/01/2018, CONSULTATION DATE:  10/01/2018 REFERRING MD:  Duke Salviaandolph, CHIEF COMPLAINT:  Respiratory Failure    History of present illness   84 year old [facility] with shortness of breath with exertion and cough. On arrival to ED CXR with low lung volumes with bibasilar atelectasis, no fever or WBC. Oxygen saturation 90% on 2L [place]. On 12/28 patient required BiPAP for hypoxia. Diuresis 10lbs with Lasix. CT Chest with persistent opacification of the bronchus intermedius, right middle and right lowe lobe concerning for mucoid impaction vs neoplasm.  Given persistent hypoxia with BiPAP patient was intubated and [facility].    Reported recent extensive hospital stay [facility] for [facility], bronchoscopy performed, cultures negative, believed to be mucous plugging. Since discharge [facility].   Past Medical History  CVA with residual left sided weakness, COPD, GERD, HTN, HLD  Significant Hospital Events   12/27 > [facility]  12/29 > Intubated, [facility]  Consults:  PCCM  Procedures:  ETT 12/29 >   Significant Diagnostic Tests:  CXR 12/29 > Mild Bibasilar Atelectasis  Micro Data:  Tracheal Asp 12/29 >> RVP 12/29 > neg  Antimicrobials:  Zosyn 12/30 >   Interim history/subjective:  No acute events. On 50% FiO2, 8 PEEP.  Objective   Blood pressure 128/66, pulse 68, temperature 99.4 F (37.4 C), temperature source Oral, resp. rate 16, height 5\' 6"  (1.676 m), weight 82.2 kg, SpO2 96 %.    Vent Mode: PRVC FiO2 (%):  [50 %-85 %] 50 % Set Rate:  [16 bmp] 16 bmp Vt Set:  [520 mL] 520 mL PEEP:  [5 cmH20-8 cmH20] 8 cmH20 Plateau Pressure:  [21 cmH20-31 cmH20] 21 cmH20   Intake/Output Summary (Last 24 hours) at 10/02/2018 1103 Last data filed at 10/02/2018 1000 Gross per 24 hour  Intake 596.66 ml    Output 650 ml  Net -53.34 ml   Filed Weights   10/01/18 1908 10/02/18 0500  Weight: 81 kg 82.2 kg    Examination: General: Elderly Female on vent, in NAD HENT: Dry MM, ETT in place  Lungs: Faint basilar crackles, otherwise clear Cardiovascular: RRR, no MRG Abdomen: Soft, non-distended, active bowel sounds  Extremities: -edema  Neuro: sedated, follows commands, pupils intact  GU: foley in place    Assessment & Plan:   Acute Hypoxic Respiratory Failure with mucous plugging with suspected Chronic Bronchitis, Family reports Chronic Cough.  I suspect that she has been aspirating all along; likely due to underlying dysphagia from previous CVA. H/O Tobacco Use Plan - Continue full vent support. - Wean FiO2 / PEEP. - SBT trials once more awake (has required sedation due to agitation). - Continue chest PT. - Add zosyn for probable aspiration (D/c azithromycin). - Continue BD's. - Follow CXR. - SLP eval once extubated.  HTN, HLD ECHO 12/28 OSH > EF 60-65 with no regional wall abnormalities with impaired diastolic relaxation.   Plan  - Cardiac Monitoring. - Maintain MAP >65 (Currently holding home HTN meds). - Continue Lipitor, ASA.  Chronic Renal Insuffiencey (Base Crt 1.1-1.3) Volume Overload > Diuresis 10lbs at OSH  Plan - Trend BMP. - Replace electrolytes as indicated. - Hold Further Diuresis at this time.  GERD ?Dyshagia > Per Family patient has swallow evaluation [facility] one [facility], was discharged on Regular diet with thin  liquids; however, she does intermittently cough while eating and tends to have trouble with foods such as rice. Plan  - PPI. - Swallow Evaluation once extubated; ? Barium swallow.  Sedation Needs  H/O Neuropathy  Plan  - Continue Precedex with PRN Fentanyl.  - RASS goal 0 to -1. - Daily WUA.   Best practice:  Diet: NPO Pain/Anxiety/Delirium protocol: Precedex gtt / Fentanyl PRN.  RASS goal 0 to -1. VAP protocol:  In  place DVT prophylaxis: Heparin SQ / SCD's. GI prophylaxis: PPI. Glucose control: SSI. Mobility: Bedrest. Code Status: FC. Family Communication: Updated daughter Elita Quickam at bedside 12/30 and daughter Clydie BraunKaren over the phone.  Clydie BraunKaren is HCPOA [place].   Critical care time: 30 min    Rutherford Guysahul Amandalee Lacap, [place]PA - [facility] [place] [facility] Pager: [phone]  or [phone] 10/02/2018, 12:29 PM

## 2018-10-03 ENCOUNTER — Inpatient Hospital Stay (HOSPITAL_COMMUNITY): Payer: Medicare Other

## 2018-10-03 DIAGNOSIS — J398 Other specified diseases of upper respiratory tract: Secondary | ICD-10-CM

## 2018-10-03 LAB — POCT I-STAT 3, ART BLOOD GAS (G3+)
Acid-Base Excess: 4 mmol/L — ABNORMAL HIGH (ref 0.0–2.0)
Bicarbonate: 29 mmol/L — ABNORMAL HIGH (ref 20.0–28.0)
O2 Saturation: 94 %
Patient temperature: 98.6
TCO2: 30 mmol/L (ref 22–32)
pCO2 arterial: 42.2 mmHg (ref 32.0–48.0)
pH, Arterial: 7.445 (ref 7.350–7.450)
pO2, Arterial: 67 mmHg — ABNORMAL LOW (ref 83.0–108.0)

## 2018-10-03 LAB — GLUCOSE, CAPILLARY
Glucose-Capillary: 113 mg/dL — ABNORMAL HIGH (ref 70–99)
Glucose-Capillary: 126 mg/dL — ABNORMAL HIGH (ref 70–99)
Glucose-Capillary: 85 mg/dL (ref 70–99)
Glucose-Capillary: 88 mg/dL (ref 70–99)
Glucose-Capillary: 90 mg/dL (ref 70–99)

## 2018-10-03 LAB — BASIC METABOLIC PANEL
Anion gap: 8 (ref 5–15)
BUN: 30 mg/dL — ABNORMAL HIGH (ref 8–23)
CO2: 29 mmol/L (ref 22–32)
CREATININE: 1.31 mg/dL — AB (ref 0.44–1.00)
Calcium: 8.6 mg/dL — ABNORMAL LOW (ref 8.9–10.3)
Chloride: 107 mmol/L (ref 98–111)
GFR calc Af Amer: 43 mL/min — ABNORMAL LOW (ref >60)
GFR calc non Af Amer: 37 mL/min — ABNORMAL LOW (ref >60)
Glucose, Bld: 90 mg/dL (ref 70–99)
Potassium: 3.1 mmol/L — ABNORMAL LOW (ref 3.5–5.1)
Sodium: 144 mmol/L (ref 135–145)

## 2018-10-03 LAB — CBC
HCT: 36.4 % (ref 36.0–46.0)
Hemoglobin: 11.3 g/dL — ABNORMAL LOW (ref 12.0–15.0)
MCH: 29 pg (ref 26.0–34.0)
MCHC: 31 g/dL (ref 30.0–36.0)
MCV: 93.3 fL (ref 80.0–100.0)
Platelets: 210 10*3/uL (ref 150–400)
RBC: 3.9 MIL/uL (ref 3.87–5.11)
RDW: 15.8 % — ABNORMAL HIGH (ref 11.5–15.5)
WBC: 12 10*3/uL — ABNORMAL HIGH (ref 4.0–10.5)
nRBC: 0 % (ref 0.0–0.2)

## 2018-10-03 LAB — MAGNESIUM: MAGNESIUM: 2.4 mg/dL (ref 1.7–2.4)

## 2018-10-03 LAB — PHOSPHORUS: Phosphorus: 3.5 mg/dL (ref 2.5–4.6)

## 2018-10-03 MED ORDER — FLUTICASONE FUROATE-VILANTEROL 200-25 MCG/INH IN AEPB
1.0000 | INHALATION_SPRAY | Freq: Every day | RESPIRATORY_TRACT | Status: DC
  Administered 2018-10-04 – 2018-10-09 (×6): 1 via RESPIRATORY_TRACT
  Filled 2018-10-03: qty 28

## 2018-10-03 MED ORDER — LISINOPRIL 10 MG PO TABS
10.0000 mg | ORAL_TABLET | Freq: Every day | ORAL | Status: DC
  Administered 2018-10-03 – 2018-10-09 (×7): 10 mg via ORAL
  Filled 2018-10-03 (×7): qty 1

## 2018-10-03 MED ORDER — PREGABALIN 100 MG PO CAPS
300.0000 mg | ORAL_CAPSULE | Freq: Two times a day (BID) | ORAL | Status: DC
  Administered 2018-10-03 – 2018-10-09 (×12): 300 mg via ORAL
  Filled 2018-10-03 (×12): qty 3

## 2018-10-03 MED ORDER — FLUTICASONE FUROATE-VILANTEROL 200-25 MCG/INH IN AEPB: 1.0000 | INHALATION_SPRAY | Freq: Every day | RESPIRATORY_TRACT | Status: DC

## 2018-10-03 MED ORDER — GABAPENTIN 300 MG PO CAPS
300.0000 mg | ORAL_CAPSULE | Freq: Two times a day (BID) | ORAL | Status: DC
  Filled 2018-10-03: qty 1

## 2018-10-03 MED ORDER — POTASSIUM CHLORIDE 10 MEQ/100ML IV SOLN
10.0000 meq | INTRAVENOUS | Status: AC
  Administered 2018-10-03 (×4): 10 meq via INTRAVENOUS
  Filled 2018-10-03 (×4): qty 100

## 2018-10-03 MED ORDER — DULOXETINE HCL 60 MG PO CPEP
60.0000 mg | ORAL_CAPSULE | Freq: Every day | ORAL | Status: DC
  Administered 2018-10-03 – 2018-10-09 (×7): 60 mg via ORAL
  Filled 2018-10-03 (×7): qty 1

## 2018-10-03 MED ORDER — ORAL CARE MOUTH RINSE
15.0000 mL | Freq: Two times a day (BID) | OROMUCOSAL | Status: DC
  Administered 2018-10-03 (×2): 15 mL via OROMUCOSAL

## 2018-10-03 NOTE — Consult Note (Signed)
   [facility] CM Inpatient Consult   10/03/2018  Cindy BushyJean O Schley Nov 17, 1930 [id number]  [facility].  Chart reviewed and [facility].  No community follow up needs assessed.  For questions, please contact:  Charlesetta ShanksVictoria Sonji Starkes, RN BSN CCM [facility] Liaison  [phone] business mobile phone Toll free office [phone]

## 2018-10-03 NOTE — Progress Notes (Addendum)
Modified Barium Swallow Progress Note  Patient Details  Name: Cindy BushyJean O Bobby MRN: [id number] Date of Birth: [birth year]  Today's Date: 10/03/2018  Modified Barium Swallow completed.  Full report located under Chart Review in the Imaging Section.  Brief recommendations include the following:  Clinical Impression  Pt has a mild oropharyngeal dysphagia with suspected esophageal component as well. She has very mild oral residue and L buccal pocketing, which she self-regulates. Premature spillage occurs primarily to the valleculae with thin liquids, although in one instance, when trying to swallow the pill, the thin liquids spilled into the airway before the swallow. Aspiration was silent and could not be cleared despite cued cough. No other aspiration was observed, but it should be noted that pt primarily took very small, single boluses even with Max cues and encouragement from SLP for more challenging. Pt does appear to have a protruding pharyngeal wall (unclear if these are soft tissue changes? Reviewed later with PCCM MD) but this does not seem to significantly impact swallow function. During brief esophageal scan, there did appear to be some barium that remained in the esophagus/was slow to clear (MD not present to confirm). Suspect that pt may have been having some silent aspiration since CVA over the summer, although daughter reports that she did not begin to develop respiratory issues until she discharged from SNF to ALF, where she became more sedentary and would often lie down after eating. Suspect that a prandial and post-prandial component could be contributing. Discussed precautions like small bites/sips, remaining upright during and after meals, and avoiding mixed consistencies. We also discussed the importance of oral hygiene and an active lifestyle. Will start a Dys 3 diet and thin liquids with these precautions. Pt would also likely benefit from EMST to strengthen her muscles of respiration and  cough to maximize airway protection.   Swallow Evaluation Recommendations   Recommended Consults: Consider esophageal assessment   SLP Diet Recommendations: Dysphagia 3 (Mech soft) solids;Thin liquid   Liquid Administration via: Cup;Straw   Medication Administration: Whole meds with puree   Supervision: Staff to assist with self feeding   Compensations: Slow rate;Small sips/bites   Postural Changes: Remain semi-upright after after feeds/meals (Comment);Seated upright at 90 degrees   Oral Care Recommendations: Oral care BID        Maxcine Hamaiewonsky, Leilan Bochenek 10/03/2018,3:19 PM   Maxcine HamLaura Paiewonsky, M.A. CCC-SLP Acute Herbalistehabilitation Services Pager [phone] Office [phone]

## 2018-10-03 NOTE — Evaluation (Signed)
Clinical/Bedside Swallow Evaluation Patient Details  Name: Grace BushyJean O Vides MRN: [id number] Date of Birth: Mar 08, 1931  Today's Date: 10/03/2018 Time: SLP Start Time (ACUTE ONLY): 1123 SLP Stop Time (ACUTE ONLY): 1145 SLP Time Calculation (min) (ACUTE ONLY): 22 min  Past Medical History:  Past Medical History:  Diagnosis Date  . Anxiety   . Arthritis   . Asthma    as a child  . Bronchial asthma    at present time started 3 days ago  . Depression   . GERD (gastroesophageal reflux disease)   . Heart murmur    hx  . Hepatitis    hepB 30 years ago  . Hypertension   . Pneumonia 17   hx  . UTI (lower urinary tract infection)    hx   Past Surgical History:  Past Surgical History:  Procedure Laterality Date  . EYE SURGERY Bilateral   . hammer toes     10-15 years ago both feet  . JOINT REPLACEMENT     right knee  . TOTAL KNEE ARTHROPLASTY Left 02/07/2015   Procedure: TOTAL KNEE ARTHROPLASTY;  Surgeon: Jodi GeraldsJohn Graves, MD;  Location: [facility] OR;  Service: Orthopedics;  Laterality: Left;   HPI:  Pt is an [age] year old female who [facility] with shortness of breath on exertion and cough. ETT 12/29, self-extubated 12/30. Bronch 12/30 showed severe tracheobronchomalacia causing complete collapse of the right trachea at the level of the ETT; thick mucus noted and suctioned. Per MD note, family reported that pt had a swallow [facility] and d/c on regular diet and thin liquids; however, they report intermittent coughing and trouble swallowing. PMH also includes: PNA, HTN, hepatitis, GERD, depression, asthma, anxiety   Assessment / Plan / Recommendation Clinical Impression  Pt's subjective symptoms suggest a primary esophageal component (feeling like solids get "stuck", heartburn sensation, needing liquids to wash down solids, regurgitation, etc.) although she does have immediate throat clearing and coughing with thin liquids that may be an acute change  given low vocal intensity s/p brief intubation but with self-extubation. Recommend proceeding with MBS today. Pt can have a few single ice chips after oral care and meds in puree pending completion. SLP Visit Diagnosis: Dysphagia, unspecified (R13.10)    Aspiration Risk  Moderate aspiration risk    Diet Recommendation NPO except meds;Ice chips PRN after oral care   Medication Administration: Whole meds with puree    Other  Recommendations Oral Care Recommendations: Oral care QID   Follow up Recommendations        Frequency and Duration            Prognosis Prognosis for Safe Diet Advancement: Good      Swallow Study   General HPI: Pt is an 84 year old female who [facility] with shortness of breath on exertion and cough. ETT 12/29, self-extubated 12/30. Bronch 12/30 showed severe tracheobronchomalacia causing complete collapse of the right trachea at the level of the ETT; thick mucus noted and suctioned. Per MD note, family reported that pt had a swallow evaluation [facility]1 month ago [facility] and d/c on regular diet and thin liquids; however, they report intermittent coughing and trouble swallowing. PMH also includes: PNA, HTN, hepatitis, GERD, depression, asthma, anxiety Type of Study: Bedside Swallow Evaluation Previous Swallow Assessment: see HPI Diet Prior to this Study: NPO Temperature Spikes Noted: No Respiratory Status: Nasal cannula History of Recent Intubation: Yes Length of Intubations (days):  1 days Date extubated: 10/02/18 Behavior/Cognition: Alert;Cooperative;Other (Comment)(mildly delayed responses) Oral Cavity Assessment: Within Functional Limits Vision: Functional for self-feeding Self-Feeding Abilities: Able to feed self Patient Positioning: Upright in bed Baseline Vocal Quality: Low vocal intensity Volitional Cough: Weak Volitional Swallow: Able to elicit    Oral/Motor/Sensory Function Overall Oral Motor/Sensory Function: Mild  impairment(L facial droop at baseline)   Ice Chips Ice chips: Within functional limits Presentation: Spoon   Thin Liquid Thin Liquid: Impaired Presentation: Cup;Self Fed Pharyngeal  Phase Impairments: Throat Clearing - Immediate;Cough - Immediate    Nectar Thick Nectar Thick Liquid: Not tested   Honey Thick Honey Thick Liquid: Not tested   Puree Puree: Within functional limits Presentation: Spoon   Solid     Solid: Not tested      Maxcine Hamaiewonsky, Layonna Dobie 10/03/2018,11:58 AM   Maxcine HamLaura Paiewonsky, M.A. CCC-SLP Acute Herbalistehabilitation Services Pager [phone] Office [phone]

## 2018-10-03 NOTE — Progress Notes (Signed)
NAME:  Cindy Briggs, MRN:  [id number], DOB:  May 03, 1931, LOS: 2 ADMISSION DATE:  10/01/2018, CONSULTATION DATE:  10/01/2018 REFERRING MD:  Duke [facility], CHIEF COMPLAINT:  Respiratory Failure    History of present illness   84 year old female [facility] with shortness of breath with exertion and cough. On arrival to ED CXR with low lung volumes with bibasilar atelectasis, no fever or WBC. Oxygen saturation 90% on 2L [place]. On 12/28 patient required BiPAP for hypoxia. Diuresis 10lbs with Lasix. CT Chest with persistent opacification of the bronchus intermedius, right middle and right lowe lobe concerning for mucoid impaction vs neoplasm.  Given persistent hypoxia with BiPAP patient was intubated and [facility].    Reported recent extensive hospital stay [facility] for suspected [facility], bronchoscopy performed, cultures negative, believed to be mucous plugging. Since discharge [facility] been staying at [facility].   Past Medical History  CVA with residual left sided weakness, COPD, GERD, HTN, HLD  Significant Hospital Events   12/27 > [facility]  12/29 > Intubated, [facility] 12[facility] > self extubated  Consults:  PCCM  Procedures:  ETT 12/29 > 12/30  Significant Diagnostic Tests:  CXR 12/29 > Mild Bibasilar Atelectasis Bronch 12/30 > severe tracheobronchomalacia causing complete collapse of the right trachea at the level of the ETT, thick mucus noted and suctioned.  Micro Data:  Tracheal Asp 12/29 >> RVP 12/29 > neg  Antimicrobials:  Zosyn 12/30 >   Interim history/subjective:  Self extubated overnight. Tolerated OK. This AM, is comfortable on 4L O2 via [place].  Objective   Blood pressure 119/63, pulse 79, temperature 98.3 F (36.8 C), temperature source Oral, resp. rate 10, height 5\' 6"  (1.676 m), weight 80.2 kg, SpO2 94 %.    Vent Mode: PRVC FiO2 (%):  [40 %-100 %] 40 % Set Rate:  [16 bmp] 16 bmp Vt Set:  [520 mL] 520  mL PEEP:  [8 cmH20] 8 cmH20 Plateau Pressure:  [18 cmH20-20 cmH20] 18 cmH20   Intake/Output Summary (Last 24 hours) at 10/03/2018 [id number] Last data filed at 10/03/2018 0800 Gross per 24 hour  Intake 849.53 ml  Output 703 ml  Net 146.53 ml   Filed Weights   10/01/18 1908 10/02/18 0500 10/03/18 0500  Weight: 81 kg 82.2 kg 80.2 kg    Examination:  General: Elderly [place], in NAD HENT: Dry MM, EOMI Lungs: CTAB Cardiovascular: RRR, no MRG Abdomen: Soft, non-distended, active bowel sounds  Extremities: -edema  Neuro: A&O x 3, no deficits, follows commands GU: foley in place    Assessment & Plan:   Acute Hypoxic Respiratory Failure with mucous plugging - I suspect that she has been aspirating all along; likely due to underlying dysphagia from previous CVA.  Had bronchoscopy performed 12/30 that also demonstrated severe tracheobronchomalacia. H/O Tobacco Use. - Continue supplemental O2 as needed to maintain SpO2 > 92%. - Nocturnal CPAP to help stent airways open. - SLP eval. - Continue chest PT. - Continue zosyn for probable aspiration (if passes swallow, can change to PO agent). - Continue BD's. - Follow CXR.  HTN, HLD. - Cardiac Monitoring. - Maintain MAP >65 (Currently holding home HTN meds). - Hold Lipitor, ASA until swallow eval has been completed.  Chronic Renal Insuffiencey (Base Crt 1.1-1.3). Volume Overload > Diuresis 10lbs at OSH. - Trend BMP. - Replace electrolytes as indicated. - Hold Further Diuresis at this time.  Hypokalemia. - 4 runs K. - Follow  BMP.  GERD. ?Dyshagia > Per Family patient has swallow [facility], was discharged on Regular diet with thin liquids; however, she does intermittently cough while eating and tends to have trouble with foods such as rice. - PPI. - SLP eval (? Barium swallow needed).   Best practice:  Diet: NPO until after SLP eval. Pain/Anxiety/Delirium protocol: None. VAP protocol:  None. DVT  prophylaxis: Heparin SQ / SCD's. GI prophylaxis: PPI. Glucose control: SSI. Mobility: Bedrest. Code Status: FC. Family Communication: Updated daughter Elita Quickam at bedside 12/30 and daughter Clydie BraunKaren over the phone.  Clydie BraunKaren is HCPOA who lives in [place].  Transfer out of ICU today to Med-Surg.  Will ask TRH to assume care in AM 1/1 with PCCM off at that time.   Critical care time: 30 min    Rutherford Guysahul Desai, [place] - [facility] [place] [facility] Pager: [phone]  or [phone] 10/03/2018, 9:22 AM

## 2018-10-04 LAB — CULTURE, RESPIRATORY W GRAM STAIN: Culture: NORMAL

## 2018-10-04 NOTE — Social Work (Addendum)
CSW acknowledging pt from [facility] in [place]. CSW will be able to support SNF if recommended.   Will follow for therapy recommendations. Please order PT/OT to assess pt level of functioning.   Octavio Graves, MSW, [facility] Clinical Social Work [phone]

## 2018-10-04 NOTE — Care Management Important Message (Signed)
Important Message  Patient Details  Name: Cindy Briggs MRN: [id number] Date of Birth: 08-Jul-1931   Medicare Important Message Given:  Yes    Oralia RudMegan P Ece Cumberland 10/04/2018, 12:49 PM

## 2018-10-04 NOTE — Progress Notes (Signed)
Pt placed on CPAP with the auto settings for the night and 3lpm oxygen bled into the system.  Pt tolerating well at this time with a full face mask.

## 2018-10-05 LAB — BASIC METABOLIC PANEL
Anion gap: 8 (ref 5–15)
BUN: 18 mg/dL (ref 8–23)
CO2: 27 mmol/L (ref 22–32)
Calcium: 8.4 mg/dL — ABNORMAL LOW (ref 8.9–10.3)
Chloride: 109 mmol/L (ref 98–111)
Creatinine, Ser: 1.23 mg/dL — ABNORMAL HIGH (ref 0.44–1.00)
GFR calc non Af Amer: 40 mL/min — ABNORMAL LOW (ref >60)
GFR, EST AFRICAN AMERICAN: 46 mL/min — AB (ref >60)
Glucose, Bld: 107 mg/dL — ABNORMAL HIGH (ref 70–99)
Potassium: 3.5 mmol/L (ref 3.5–5.1)
Sodium: 144 mmol/L (ref 135–145)

## 2018-10-05 LAB — CBC
HCT: 34.9 % — ABNORMAL LOW (ref 36.0–46.0)
HEMOGLOBIN: 10.5 g/dL — AB (ref 12.0–15.0)
MCH: 28.1 pg (ref 26.0–34.0)
MCHC: 30.1 g/dL (ref 30.0–36.0)
MCV: 93.3 fL (ref 80.0–100.0)
Platelets: 196 10*3/uL (ref 150–400)
RBC: 3.74 MIL/uL — ABNORMAL LOW (ref 3.87–5.11)
RDW: 15.6 % — ABNORMAL HIGH (ref 11.5–15.5)
WBC: 10.3 10*3/uL (ref 4.0–10.5)
nRBC: 0 % (ref 0.0–0.2)

## 2018-10-05 MED ORDER — MELATONIN 3 MG PO TABS
3.0000 mg | ORAL_TABLET | Freq: Every evening | ORAL | Status: DC | PRN
  Administered 2018-10-05 – 2018-10-08 (×4): 3 mg via ORAL
  Filled 2018-10-05 (×5): qty 1

## 2018-10-05 MED ORDER — IPRATROPIUM-ALBUTEROL 0.5-2.5 (3) MG/3ML IN SOLN
3.0000 mL | Freq: Four times a day (QID) | RESPIRATORY_TRACT | Status: DC
  Administered 2018-10-05 – 2018-10-09 (×18): 3 mL via RESPIRATORY_TRACT
  Filled 2018-10-05 (×16): qty 3

## 2018-10-05 NOTE — Evaluation (Signed)
Physical Therapy Evaluation Patient Details Name: Cindy Briggs MRN: [id number] DOB: [birth year] Today's Date: 10/05/2018   History of Present Illness  85 year old female [name] on [facility] with shortness of breath , bibasilar atelectasis, worsening  respiratry failure On 12/28, intubated and [facility].  [facility] of [facility]. H/O stroke, COPD.  Clinical Impression  The patient requires extensive assist for transfers to recliner. Patient and daughter are agreeable to SNF for rehab, patient currently will require increased level of care than ALF. Pt admitted with above diagnosis. Pt currently with functional limitations due to the deficits listed below (see PT Problem List).  Pt will benefit from skilled PT to increase their independence and safety with mobility to allow discharge to the venue listed below.       Follow Up Recommendations SNF    Equipment Recommendations  None recommended by PT    Recommendations for Other Services       Precautions / Restrictions Precautions Precautions: Fall Precaution Comments: watch O2      Mobility  Bed Mobility Overal bed mobility: Needs Assistance Bed Mobility: Supine to Sit     Supine to sit: Mod assist;HOB elevated     General bed mobility comments: extra time to move to bed edge, assist with trunk and legs  Transfers Overall transfer level: Needs assistance Equipment used: Rolling walker (2 wheeled) Transfers: Sit to/from BJ'sStand;Stand Pivot Transfers Sit to Stand: Max assist;From elevated surface Stand pivot transfers: Max assist       General transfer comment: assist to rise from bed. Attempted x 2 to transfer. Extra time to step around to recliner. Feet tending to slide/ Stromg posterior lean.  Ambulation/Gait                Stairs            Wheelchair Mobility    Modified Rankin (Stroke Patients Only)       Balance Overall balance  assessment: History of Falls;Needs assistance Sitting-balance support: Feet supported;Bilateral upper extremity supported Sitting balance-Leahy Scale: Fair     Standing balance support: During functional activity;Bilateral upper extremity supported Standing balance-Leahy Scale: Poor                               Pertinent Vitals/Pain Pain Assessment: No/denies pain    Home Living Family/patient expects to be discharged to:: Assisted living               Home Equipment: Walker - 4 wheels Additional Comments: has been in ALF, has had several falls.    Prior Function Level of Independence: Needs assistance   Gait / Transfers Assistance Needed: RW           Hand Dominance        Extremity/Trunk Assessment   Upper Extremity Assessment Upper Extremity Assessment: Generalized weakness    Lower Extremity Assessment Lower Extremity Assessment: Generalized weakness    Cervical / Trunk Assessment Cervical / Trunk Assessment: Kyphotic  Communication      Cognition Arousal/Alertness: Awake/alert Behavior During Therapy: WFL for tasks assessed/performed   Area of Impairment: Memory;Orientation;Safety/judgement;Awareness                 Orientation Level: Time   Memory: Decreased recall of precautions   Safety/Judgement: Decreased awareness of deficits;Decreased awareness of safety     General Comments: patient wanting to ambualte to  BR in spite of difficulty getting to Recliner.      General Comments      Exercises     Assessment/Plan    PT Assessment Patient needs continued PT services  PT Problem List Decreased strength;Decreased activity tolerance;Decreased balance;Decreased mobility;Decreased knowledge of precautions;Decreased safety awareness;Decreased knowledge of use of DME;Decreased cognition       PT Treatment Interventions DME instruction;Therapeutic exercise;Gait training;Functional mobility training;Therapeutic  activities;Patient/family education;Balance training    PT Goals (Current goals can be found in the Care Plan section)  Acute Rehab PT Goals Patient Stated Goal: to get stronger and go home PT Goal Formulation: With patient/family Time For Goal Achievement: 10/19/18 Potential to Achieve Goals: Fair    Frequency Min 2X/week   Barriers to discharge Decreased caregiver support      Co-evaluation               AM-PAC PT "6 Clicks" Mobility  Outcome Measure Help needed turning from your back to your side while in a flat bed without using bedrails?: Total Help needed moving from lying on your back to sitting on the side of a flat bed without using bedrails?: Total Help needed moving to and from a bed to a chair (including a wheelchair)?: Total Help needed standing up from a chair using your arms (e.g., wheelchair or bedside chair)?: Total Help needed to walk in hospital room?: Total Help needed climbing 3-5 steps with a railing? : Total 6 Click Score: 6    End of Session Equipment Utilized During Treatment: Gait belt Activity Tolerance: Patient limited by fatigue Patient left: in chair;with call bell/phone within reach;with chair alarm set;with nursing/sitter in room;with family/visitor present Nurse Communication: Mobility status PT Visit Diagnosis: Unsteadiness on feet (R26.81)    Time: [id number] PT Time Calculation (min) (ACUTE ONLY): 27 min   Charges:   PT Evaluation $PT Eval Low Complexity: 1 Low PT Treatments $Therapeutic Activity: 8-22 mins        Blanchard KelchKaren Emalie Mcwethy PT Acute Rehabilitation Services Pager [phone] Office [phone]   Rada HayHill, Tayshawn Purnell Elizabeth 10/05/2018, 4:55 PM

## 2018-10-05 NOTE — Progress Notes (Addendum)
NAME:  Cindy Briggs, MRN:  [id number], DOB:  1931/05/14, LOS: 4 ADMISSION DATE:  10/01/2018, CONSULTATION DATE:  10/01/2018 REFERRING MD:  Duke Salvia, CHIEF COMPLAINT:  Respiratory Failure    History of present illness   85 year old female presents to [facility] on 12/27 with shortness of breath with exertion and cough. On arrival to ED CXR with low lung volumes with bibasilar atelectasis, no fever or WBC. Oxygen saturation 90% on 2L [place]. On 12/28 patient required BiPAP for hypoxia. Diuresis 10lbs with Lasix. CT Chest with persistent opacification of the bronchus intermedius, right middle and right lowe lobe concerning for mucoid impaction vs neoplasm.  Given persistent hypoxia with BiPAP patient was intubated and transferred to [facility].    Reported recent extensive hospital stay at [facility] for suspected PNA, bronchoscopy performed, cultures negative, believed to be mucous plugging. Since discharge has been staying at [facility].   Past Medical History  CVA with residual left sided weakness, COPD, GERD, HTN, HLD  Significant Hospital Events   12/27  Admitted to [facility]  12/29  Intubated, Transferred to [facility] 12/30  self extubated 12/31  To floor   Consults:  PCCM  Procedures:  ETT 12/29 > 12/30  Significant Diagnostic Tests:  CXR 12/29 > Mild Bibasilar Atelectasis Bronch 12/30 > severe tracheobronchomalacia causing complete collapse of the right trachea at the level of the ETT, thick mucus noted and suctioned.  Micro Data:  Tracheal Asp 12/29 >> RVP 12/29 > neg  Antimicrobials:  Zosyn 12/30 >   Interim history/subjective:  Pt denies acute complaints - no coughing / choking with swallowing.  Denies SOB.    Objective   Blood pressure 122/62, pulse 65, temperature 98.2 F (36.8 C), temperature source Oral, resp. rate 19, height 5\' 6"  (1.676 m), weight 81.7 kg, SpO2 95 %.        Intake/Output Summary (Last 24 hours) at 10/05/2018 1137 Last data filed at  10/04/2018 2201 Gross per 24 hour  Intake -  Output 200 ml  Net -200 ml   Filed Weights   10/03/18 0500 10/03/18 1741 10/04/18 0511  Weight: 80.2 kg 86.6 kg 81.7 kg    Examination:  General: elderly female lying in bed in NAD HEENT: MM pink/moist Neuro: Awakens to voice, answers appropriately, MAE CV: s1s2 rrr, no m/r/g PULM: even/non-labored, lungs bilaterally clear  MG:QQPY, non-tender, bsx4 active  Extremities: warm/dry, no edema  Skin: no rashes or lesions  Assessment & Plan:   Acute Hypoxic Respiratory Failure with mucous plugging - suspect that she has been aspirating all along; likely due to underlying dysphagia from previous CVA.  Had bronchoscopy performed 12/30 that also demonstrated severe tracheobronchomalacia. H/O Tobacco Use P: Wean O2 for sats >92% Pulmonary hygiene - IS, mobilize  Continue nocturnal CPAP to stent airways  Chest PT  Zosyn D4/x  Follow intermittent CXR, repeat in am   HTN, HLD P: Cardiac monitoring  ASA, lipitor  Continue lisinopril   Chronic Renal Insuffiencey (Base Crt 1.1-1.3). Volume Overload > Diuresis 10lbs at OSH. P: Trend BMP / urinary output Replace electrolytes as indicated Avoid nephrotoxic agents, ensure adequate renal perfusion  Hypokalemia. P: Follow up BMP now   GERD. ?Dyshagia > Per Family patient has swallow evaluation about one month ago at [facility], was discharged on Regular diet with thin liquids; however, she does intermittently cough while eating and tends to have trouble with foods such as rice. P: PPI Continue SLP recommendations   Deconditioning  P:  PT consult for possible SNF needs.   Best practice:  Diet: D3 diet DVT prophylaxis: Heparin SQ / SCD's. GI prophylaxis: PPI. Glucose control: SSI. Mobility: Bedrest. Code Status: FC. Family Communication: No family at bedside 1/2 on NP rounds.  Daughter Pam updated 12/30 and daughter Clydie Braun over the phone.  Clydie Braun is HCPOA who lives in [place].  Transfer to  [facility] as of 1/2.  PCCM will sign off.        Canary Brim, NP-C [place] [facility] & Critical Care Pgr: [phone] or if no answer [phone] 10/05/2018, 11:37 AM

## 2018-10-05 NOTE — Plan of Care (Signed)
Patient is making progress, no complications noted, respiratiory even and unlabored. No acute distress noted

## 2018-10-05 NOTE — Progress Notes (Signed)
eLink Physician-Brief Progress Note Patient Name: Grace BushyJean O Macken DOB: 29-Apr-1931 MRN: [id number]   Date of Service  10/05/2018  HPI/Events of Note    eICU Interventions  Insomnia, takes Melatonin 6 mg at home. Discussed with bed side.  Ok to give.      Intervention Category Minor Interventions: Routine modifications to care plan (e.g. PRN medications for pain, fever)  Ranee GosselinKinila T Mumin Denomme 10/05/2018, 10:32 PM

## 2018-10-06 LAB — CBC
HCT: 33.3 % — ABNORMAL LOW (ref 36.0–46.0)
Hemoglobin: 10.3 g/dL — ABNORMAL LOW (ref 12.0–15.0)
MCH: 29.1 pg (ref 26.0–34.0)
MCHC: 30.9 g/dL (ref 30.0–36.0)
MCV: 94.1 fL (ref 80.0–100.0)
Platelets: 191 10*3/uL (ref 150–400)
RBC: 3.54 MIL/uL — ABNORMAL LOW (ref 3.87–5.11)
RDW: 15.7 % — ABNORMAL HIGH (ref 11.5–15.5)
WBC: 10.7 10*3/uL — AB (ref 4.0–10.5)
nRBC: 0 % (ref 0.0–0.2)

## 2018-10-06 LAB — BASIC METABOLIC PANEL
Anion gap: 8 (ref 5–15)
BUN: 15 mg/dL (ref 8–23)
CO2: 26 mmol/L (ref 22–32)
Calcium: 8.2 mg/dL — ABNORMAL LOW (ref 8.9–10.3)
Chloride: 108 mmol/L (ref 98–111)
Creatinine, Ser: 1.18 mg/dL — ABNORMAL HIGH (ref 0.44–1.00)
GFR calc Af Amer: 49 mL/min — ABNORMAL LOW (ref >60)
GFR calc non Af Amer: 42 mL/min — ABNORMAL LOW (ref >60)
Glucose, Bld: 111 mg/dL — ABNORMAL HIGH (ref 70–99)
Potassium: 3.2 mmol/L — ABNORMAL LOW (ref 3.5–5.1)
Sodium: 142 mmol/L (ref 135–145)

## 2018-10-06 MED ORDER — PANTOPRAZOLE SODIUM 40 MG PO TBEC
40.0000 mg | DELAYED_RELEASE_TABLET | Freq: Every day | ORAL | Status: DC
  Administered 2018-10-06 – 2018-10-09 (×4): 40 mg via ORAL
  Filled 2018-10-06 (×4): qty 1

## 2018-10-06 MED ORDER — ASPIRIN 81 MG PO CHEW
81.0000 mg | CHEWABLE_TABLET | Freq: Every day | ORAL | Status: DC
  Administered 2018-10-06 – 2018-10-09 (×4): 81 mg via ORAL
  Filled 2018-10-06 (×4): qty 1

## 2018-10-06 NOTE — Clinical Social Work Note (Signed)
Clinical Social Work Assessment  Patient Details  Name: Cindy Briggs MRN: [id number] Date of Birth: [birth year]  Date of referral:  10/06/18               Reason for consult:  Facility Placement                Permission sought to share information with:  Facility Medical sales representative, Family Supports Permission granted to share information::  Yes, Verbal Permission Granted  Name::     Karen/Crystal  Agency::  SNFs  Relationship::  Daughters  Contact Information:  [phone]  Housing/Transportation Living arrangements for the past 2 months:  Assisted Living Facility Source of Information:  Adult Children Patient Interpreter Needed:  None Criminal Activity/Legal Involvement Pertinent to Current Situation/Hospitalization:  No - Comment as needed Significant Relationships:  Adult Children Lives with:  Facility Resident Do you feel safe going back to the place where you live?  No Need for family participation in patient care:  Yes (Comment)  Care giving concerns:  CSW received consult for possible SNF placement at time of discharge. CSW spoke with patient's Steward Ros, regarding PT recommendation of SNF placement at time of discharge. Patient's daughter reported that patient resides at the [facility] in [place] but does not get enough care. Patient's daughter expressed understanding of PT recommendation and is agreeable to SNF placement at time of discharge. CSW to continue to follow and assist with discharge planning needs.   Social Worker assessment / plan:  CSW spoke with patient's duaghters concerning possibility of rehab at [facility].  Employment status:  Retired Health and safety inspector:  Medicare PT Recommendations:  Skilled Nursing Facility Information / Referral to community resources:  Skilled Nursing Facility  Patient/Family's Response to care:  Patient's daughters recognize need for rehab before returning home and is agreeable to a SNF in [place].  Patient's daughter reported preference for [facility], however they do not have beds this weekend. Clydie Braun asked CSW to contact her other sister, Crystal to determine a second choice. Crystal reports that she will tour the other two [place] facilities. They do not want patient's other daughter, Elita Quick, to make any decisions for patient.   Patient/Family's Understanding of and Emotional Response to Diagnosis, Current Treatment, and Prognosis:  Patient/family is realistic regarding therapy needs and expressed being hopeful for SNF placement. Patient's daughters expressed understanding of CSW role and discharge process as well as medical condition. No questions/concerns about plan or treatment.    Emotional Assessment Appearance:  Appears stated age Attitude/Demeanor/Rapport:  Unable to Assess Affect (typically observed):  Unable to Assess Orientation:  Oriented to Self, Oriented to Place, Oriented to Situation Alcohol / Substance use:  Not Applicable Psych involvement (Current and /or in the community):  No (Comment)  Discharge Needs  Concerns to be addressed:  Care Coordination Readmission within the last 30 days:  No Current discharge risk:  Dependent with Mobility Barriers to Discharge:  Continued Medical Work up   [employer], LCSW 10/06/2018, 5:02 PM

## 2018-10-06 NOTE — Progress Notes (Signed)
  Speech Language Pathology Treatment: Dysphagia  Patient Details Name: Cindy Briggs MRN: [id number] DOB: 31-May-1931 Today's Date: 10/06/2018 Time: 8984-2103 SLP Time Calculation (min) (ACUTE ONLY): 20 min  Assessment / Plan / Recommendation Clinical Impression  Pt consumed thin liquids without overt difficulty, although aspiration on MBS was silent. Upon SLP arrival pt was lying semi-reclined as she was drinking. Min cues needed for better positioning. Pt and her daughter both verbalize that pt has been taking her meds with thin liquids because she does not feel like she can do it with applesauce. I reiterated the risk of silent aspiration with mixed consistencies, and assisted pt in trying to identify additional purees which she would prefer over the applesauce. Pt was agreeable to trying with yogurt - RN made aware. Will continue to assess for tolerance with additional SLP f/u recommended upon d/c.   HPI HPI: Pt is an 85 year old female who presented to [facility] on 12/27 with shortness of breath on exertion and cough. ETT 12/29, self-extubated 12/30. Bronch 12/30 showed severe tracheobronchomalacia causing complete collapse of the right trachea at the level of the ETT; thick mucus noted and suctioned. Per MD note, family reported that pt had a swallow evaluation ~1 month ago at [facility] and d/c on regular diet and thin liquids; however, they report intermittent coughing and trouble swallowing. PMH also includes: PNA, HTN, hepatitis, GERD, depression, asthma, anxiety      SLP Plan  Continue with current plan of care       Recommendations  Diet recommendations: Dysphagia 3 (mechanical soft);Thin liquid(no mixed consistencies) Liquids provided via: Cup;Straw Medication Administration: Whole meds with puree Supervision: Patient able to self feed;Intermittent supervision to cue for compensatory strategies Compensations: Slow rate;Small sips/bites Postural Changes and/or Swallow  Maneuvers: Seated upright 90 degrees;Upright 30-60 min after meal                Oral Care Recommendations: Oral care BID Follow up Recommendations: Skilled Nursing facility SLP Visit Diagnosis: Dysphagia, oropharyngeal phase (R13.12) Plan: Continue with current plan of care       GO                Cindy Briggs 10/06/2018, 4:39 PM  Cindy Briggs, M.A. CCC-SLP Acute Herbalist [phone] Office [phone]

## 2018-10-06 NOTE — NC FL2 (Addendum)
[place] MEDICAID FL2 LEVEL OF CARE SCREENING TOOL     IDENTIFICATION  Patient Name: Cindy Briggs Birthdate: Nov 16, 1930 Sex: female Admission Date (Current Location): 10/01/2018  [facility] and [place] Number:  Producer, television/film/video and Address:  [facility] [place], [address] [place], [place] [address]      Provider Number: [id number]  Attending Physician Name and Address:  Elgergawy, Leana Roe, MD  Relative Name and Phone Number:     Tawnya Crook, Daughter, [phone]  Current Level of Care: SNF Recommended Level of Care: Skilled Nursing Facility Prior Approval Number:  Date Approved/Denied: 02/10/15 PASRR Number:   [id number]  Discharge Plan: SNF    Current Diagnoses: Patient Active Problem List   Diagnosis Date Noted  . Ventilator dependent (HCC)   . Acute respiratory failure (HCC) 10/01/2018  . History of arthroplasty of left knee 02/07/2015  . Diarrhea 01/31/2015  . COPD (chronic obstructive pulmonary disease) (HCC) 12/10/2014  . Lower extremity edema 11/12/2014  . Solitary pulmonary nodule 10/28/2014  . Recurrent UTI 10/28/2014  . Foot pain, bilateral 07/31/2014  . Lumbar spondylosis 04/25/2014  . Osteoarthritis of left knee 04/25/2014  . Preventive measure 04/25/2014    Orientation RESPIRATION BLADDER Height & Weight     Self, Time, Situation, Place  Other (Comment), O2(Has a CPAP; large mask, Full face mask, no equipment at home yes on auto titrate. Sp02 98, nasal cannula, flow rate 3) Continent(Has a urinary catheter) Weight: 180 lb 1.9 oz (81.7 kg) Height:  5\' 6"  (167.6 cm)  BEHAVIORAL SYMPTOMS/MOOD NEUROLOGICAL BOWEL NUTRITION STATUS      Continent Diet(Dys 3, thin fluids, no mixed consistencies meds whole in puree. )  AMBULATORY STATUS COMMUNICATION OF NEEDS Skin   Limited Assist Verbally Skin abrasions, Bruising(Skin abrasion on lip and brusing on arm)                       Personal Care Assistance Level of Assistance   Bathing, Feeding, Dressing, Total care Bathing Assistance: Limited assistance Feeding assistance: Independent Dressing Assistance: Limited assistance Total Care Assistance: Limited assistance   Functional Limitations Info  Sight, Hearing, Speech Sight Info: Adequate Hearing Info: Adequate Speech Info: Adequate(Had a speech consult for swallowing issues. )    SPECIAL CARE FACTORS FREQUENCY  PT (By licensed PT), OT (By licensed OT), Speech therapy     PT Frequency: 5x/wk OT Frequency: 5x/wk     Speech Therapy Frequency: 5x/wk      Contractures Contractures Info: Not present    Additional Factors Info  Code Status, Allergies Code Status Info: Full Code Allergies Info: Latex           Current Medications (10/06/2018):  This is the current hospital active medication list Current Facility-Administered Medications  Medication Dose Route Frequency Provider Last Rate Last Dose  . albuterol (PROVENTIL) (2.5 MG/3ML) 0.083% nebulizer solution 2.5 mg  2.5 mg Nebulization Q4H PRN Tobey Grim, NP      . aspirin chewable tablet 81 mg  81 mg Oral Daily Elgergawy, Leana Roe, MD   81 mg at 10/06/18 1004  . atorvastatin (LIPITOR) tablet 40 mg  40 mg Oral q1800 Tobey Grim, NP   40 mg at 10/05/18 1853  . DULoxetine (CYMBALTA) DR capsule 60 mg  60 mg Oral Daily Baldemar Friday, RPH   60 mg at 10/06/18 1004  . fluticasone furoate-vilanterol (BREO ELLIPTA) 200-25 MCG/INH 1 puff  1 puff Inhalation Daily Max Fickle  B, MD   1 puff at 10/06/18 0850  . heparin injection 5,000 Units  5,000 Units Subcutaneous Q8H Tobey Grim, NP   5,000 Units at 10/06/18 0600  . ipratropium-albuterol (DUONEB) 0.5-2.5 (3) MG/3ML nebulizer solution 3 mL  3 mL Nebulization QID Max Fickle B, MD   3 mL at 10/06/18 1144  . lisinopril (PRINIVIL,ZESTRIL) tablet 10 mg  10 mg Oral Daily Baldemar Friday, RPH   10 mg at 10/06/18 1004  . Melatonin TABS 3 mg  3 mg Oral QHS PRN Ranee Gosselin,  MD   3 mg at 10/05/18 2315  . pantoprazole (PROTONIX) EC tablet 40 mg  40 mg Oral Daily Elgergawy, Leana Roe, MD   40 mg at 10/06/18 1004  . piperacillin-tazobactam (ZOSYN) IVPB 3.375 g  3.375 g Intravenous [address], RPH 12.5 mL/hr at 10/06/18 0600 3.375 g at 10/06/18 0600  . pregabalin (LYRICA) capsule 300 mg  300 mg Oral BID Baldemar Friday, RPH   300 mg at 10/06/18 1003     Discharge Medications: Please see discharge summary for a list of discharge medications.  Relevant Imaging Results:  Relevant Lab Results:   Additional Information SSN: [id number];    Has new CPAP, all notes under respiration to be ordered by SNF   Caitlin B Pinion, LCSWA

## 2018-10-06 NOTE — Progress Notes (Signed)
PROGRESS NOTE                                                                                                                                                                                                             Patient Demographics:    Cindy Briggs, is a 85 y.o. female, DOB - Feb 09, 1931, ZOX:[id number]  Admit date - 10/01/2018   Admitting Physician Coralyn HellingVineet Sood, MD  Outpatient Primary MD for the patient is System, Pcp Not In  LOS - 5   No chief complaint on file.      Brief Narrative   85 year old female [facility] with shortness of breath with exertion and cough. On arrival to ED CXR with low lung volumes with bibasilar atelectasis, no fever or WBC. Oxygen saturation 90% on 2L [place]. On 12/28 patient required BiPAP for hypoxia. Diuresis 10lbs with Lasix. CT Chest with persistent opacification of the bronchus intermedius, right middle and right lowe lobe concerning for mucoid impaction vs neoplasm.  Given persistent hypoxia with BiPAP patient was intubated and [facility].    Reported recent extensive hospital [facility] for suspected PNA, bronchoscopy performed, cultures negative, believed to be mucous plugging. Since discharge has [facility]  12/27  [facility]  12/29  Intubated, Transfer[facility] 12/30  Bronch,    severe tracheobronchomalacia causing complete collapse of the right trachea at the level of the ETT, thick mucus noted and suctioned. 12/30  self extubated 12/31  To floor     Subjective:    Cindy RegalJean Briggs today has, No headache, No chest pain, No abdominal pain , ports some dyspnea, cough.   Assessment  & Plan :    Active Problems:   Acute respiratory failure (HCC)   Ventilator dependent (HCC)   Acute Hypoxic Respiratory Failure with mucous plugging  -This is most likely in the setting of aspiration, underlying dysphagia from her previous  CVA, -Bronchoscopy 10/02/2018 as well showing severe tracheobronchomalacia -Intubated on admission at [facility], self extubated 10/02/2018. -Proving, currently on 3 L nasal cannula, encourage chest PT, flutter valve, incentive spirometry. -Continue with CPAP nightly distant airway for tracheal bronchomalacia  Aspiration pneumonia -Continue with IV Zosyn, seen by SLP, on dysphagia 3 with thin liquid diet  Hypertension -Continue with lisinopril  Hyperlipidemia  -Continue with aspirin, Lipitor   Chronic Renal Insuffiencey (Base Crt 1.1-1.3). -Continue at baseline, avoid nephrotoxic medication Volume Overload > Diuresis 10lbs at OSH.  Hypokalemia. - repleting  GERD. ?Dyshagia > Per Family patient has swallow [facility] about one [facility], was discharged on Regular diet with thin liquids; however, she does intermittently cough while eating and tends to have trouble with foods such as rice. P: PPI Continue SLP recommendations   Deconditioning  P: PT consult for possible SNF needs.     Code Status : Full  Family Communication  : D/W daughter at bedside  Disposition Plan  : will need SNF   Consults  :  PCCM  Procedures  : none  DVT Prophylaxis  :  [place] heparin  Lab Results  Component Value Date   PLT 191 10/06/2018    Antibiotics  :    Anti-infectives (From admission, onward)   Start     Dose/Rate Route Frequency Ordered Stop   10/02/18 1400  piperacillin-tazobactam (ZOSYN) IVPB 3.375 g     3.375 g 12.5 mL/hr over 240 Minutes Intravenous Every 8 hours 10/02/18 1148     10/02/18 0000  azithromycin (ZITHROMAX) 500 mg in sodium chloride 0.9 % 250 mL IVPB  Status:  Discontinued     500 mg 250 mL/hr over 60 Minutes Intravenous Every 24 hours 10/01/18 2333 10/02/18 1227        Objective:   Vitals:   10/06/18 0656 10/06/18 0850 10/06/18 1145 10/06/18 1342  BP: (!) 114/56   (!) 97/48  Pulse: 62 65 71 73  Resp: 19 18 18 16   Temp: 97.6 F (36.4  C)   98.4 F (36.9 C)  TempSrc: Oral   Oral  SpO2: 96% 98% 98% 98%  Weight:      Height:        Wt Readings from Last 3 Encounters:  10/04/18 81.7 kg  03/13/15 83.5 kg  02/07/15 79.4 kg     Intake/Output Summary (Last 24 hours) at 10/06/2018 1444 Last data filed at 10/05/2018 1850 Gross per 24 hour  Intake -  Output 400 ml  Net -400 ml     Physical Exam  Awake Alert, Oriented X 3, No new F.N deficits, Normal affect Symmetrical Chest wall movement, Minister entry at the bases, scattered rhonchi RRR,No Gallops,Rubs or new Murmurs, No Parasternal Heave +ve B.Sounds, Abd Soft, No tenderness, NNo rebound - guarding or rigidity. No Cyanosis, Clubbing or edema, No new Rash or bruise      Data Review:    CBC Recent Labs  Lab 10/01/18 2006 10/02/18 0722 10/03/18 0710 10/05/18 1141 10/06/18 0503  WBC 13.2* 15.4* 12.0* 10.3 10.7*  HGB 11.6* 12.4 11.3* 10.5* 10.3*  HCT 37.7 40.7 36.4 34.9* 33.3*  PLT 243 207 210 196 191  MCV 91.7 91.5 93.3 93.3 94.1  MCH 28.2 27.9 29.0 28.1 29.1  MCHC 30.8 30.5 31.0 30.1 30.9  RDW 15.5 15.3 15.8* 15.6* 15.7*    Chemistries  Recent Labs  Lab 10/01/18 2006 10/02/18 0722 10/03/18 0710 10/05/18 1141 10/06/18 0503  NA 141 142 144 144 142  K 3.9 3.5 3.1* 3.5 3.2*  CL 101 102 107 109 108  CO2 30 28 29 27 26   GLUCOSE 134* 124* 90 107* 111*  BUN 32* 35* 30* 18 15  CREATININE 1.29* 1.21* 1.31* 1.23* 1.18*  CALCIUM 8.8* 8.8* 8.6* 8.4* 8.2*  MG 2.3 2.4 2.4  --   --   AST 35  --   --   --   --  ALT 33  --   --   --   --   ALKPHOS 84  --   --   --   --   BILITOT 0.7  --   --   --   --    ------------------------------------------------------------------------------------------------------------------ No results for input(s): CHOL, HDL, LDLCALC, TRIG, CHOLHDL, LDLDIRECT in the last 72 hours.  No results found for:  HGBA1C ------------------------------------------------------------------------------------------------------------------ No results for input(s): TSH, T4TOTAL, T3FREE, THYROIDAB in the last 72 hours.  Invalid input(s): FREET3 ------------------------------------------------------------------------------------------------------------------ No results for input(s): VITAMINB12, FOLATE, FERRITIN, TIBC, IRON, RETICCTPCT in the last 72 hours.  Coagulation profile No results for input(s): INR, PROTIME in the last 168 hours.  No results for input(s): DDIMER in the last 72 hours.  Cardiac Enzymes No results for input(s): CKMB, TROPONINI, MYOGLOBIN in the last 168 hours.  Invalid input(s): CK ------------------------------------------------------------------------------------------------------------------ No results found for: BNP  Inpatient Medications  Scheduled Meds: . aspirin  81 mg Oral Daily  . atorvastatin  40 mg Oral q1800  . DULoxetine  60 mg Oral Daily  . fluticasone furoate-vilanterol  1 puff Inhalation Daily  . heparin  5,000 Units Subcutaneous Q8H  . ipratropium-albuterol  3 mL Nebulization QID  . lisinopril  10 mg Oral Daily  . pantoprazole  40 mg Oral Daily  . pregabalin  300 mg Oral BID   Continuous Infusions: . piperacillin-tazobactam (ZOSYN)  IV 3.375 g (10/06/18 1346)   PRN Meds:.albuterol, Melatonin  Micro Results Recent Results (from the past 240 hour(s))  Respiratory Panel by PCR     Status: None   Collection Time: 10/01/18  7:47 PM  Result Value Ref Range Status   Adenovirus NOT DETECTED NOT DETECTED Final   Coronavirus 229E NOT DETECTED NOT DETECTED Final   Coronavirus HKU1 NOT DETECTED NOT DETECTED Final   Coronavirus NL63 NOT DETECTED NOT DETECTED Final   Coronavirus OC43 NOT DETECTED NOT DETECTED Final   Metapneumovirus NOT DETECTED NOT DETECTED Final   Rhinovirus / Enterovirus NOT DETECTED NOT DETECTED Final   Influenza A NOT DETECTED NOT DETECTED  Final   Influenza B NOT DETECTED NOT DETECTED Final   Parainfluenza Virus 1 NOT DETECTED NOT DETECTED Final   Parainfluenza Virus 2 NOT DETECTED NOT DETECTED Final   Parainfluenza Virus 3 NOT DETECTED NOT DETECTED Final   Parainfluenza Virus 4 NOT DETECTED NOT DETECTED Final   Respiratory Syncytial Virus NOT DETECTED NOT DETECTED Final   Bordetella pertussis NOT DETECTED NOT DETECTED Final   Chlamydophila pneumoniae NOT DETECTED NOT DETECTED Final   Mycoplasma pneumoniae NOT DETECTED NOT DETECTED Final    Comment: Performed at [facility], [address] [place], [place] [address]  MRSA PCR Screening     Status: None   Collection Time: 10/01/18  7:47 PM  Result Value Ref Range Status   MRSA by PCR NEGATIVE NEGATIVE Final    Comment:        The GeneXpert MRSA Assay (FDA approved for NASAL specimens only), is one component of a comprehensive MRSA colonization surveillance program. It is not intended to diagnose MRSA infection nor to guide or monitor treatment for MRSA infections. Performed at [facility], [address], [place], [place] [address]   Culture, respiratory (non-expectorated)     Status: None   Collection Time: 10/01/18  9:48 PM  Result Value Ref Range Status   Specimen Description TRACHEAL ASPIRATE  Final   Special Requests NONE  Final   Gram Stain   Final  ABUNDANT WBC PRESENT, PREDOMINANTLY PMN RARE GRAM POSITIVE COCCI Performed at [facility], [address], [place], [place] [address]    Culture FEW Consistent with normal respiratory flora.  Final   Report Status 10/04/2018 FINAL  Final    Radiology Reports Dg Chest Port 1 View  Result Date: 10/03/2018 CLINICAL DATA:  Respiratory failure. EXAM: PORTABLE CHEST 1 VIEW COMPARISON:  10/01/2018. FINDINGS: Interim removal of endotracheal tube and NG tube. What appears to be the ascending aorta is accentuated, this most likely second patient rotation/positioning. Heart size normal.  Bibasilar atelectasis again noted. Mild infiltrate left lung base could not be excluded on today's exam. Small left pleural effusion. No pneumothorax. IMPRESSION: Bibasilar atelectasis again noted. Developing left base infiltrate on today's exam can be excluded. Small left pleural effusion. Electronically Signed   By: Maisie Fus  Register   On: 10/03/2018 05:38   Dg Chest Port 1 View  Result Date: 10/01/2018 CLINICAL DATA:  Ventilator dependent. EXAM: PORTABLE CHEST 1 VIEW COMPARISON:  Radiograph of October 01, 2018. FINDINGS: Stable cardiac size. Endotracheal and nasogastric tubes are unchanged in position. Stable calcified left hilar lymph nodes are noted. No pneumothorax or pleural effusion is noted. Mild bibasilar subsegmental atelectasis is noted. Bony thorax is unremarkable. IMPRESSION: Stable support apparatus.  Mild bibasilar subsegmental atelectasis. Electronically Signed   By: Lupita Raider, M.D.   On: 10/01/2018 20:44   Dg Swallowing Func-speech Pathology  Result Date: 10/03/2018 Objective Swallowing Evaluation: Type of Study: MBS-Modified Barium Swallow Study  Patient Details Name: RONASIA ISOLA MRN: [id number] Date of Birth: 12-May-1931 Today's Date: 10/03/2018 Time: SLP Start Time (ACUTE ONLY): 1317 -SLP Stop Time (ACUTE ONLY): 1354 SLP Time Calculation (min) (ACUTE ONLY): 37 min Past Medical History: Past Medical History: Diagnosis Date . Anxiety  . Arthritis  . Asthma   as a child . Bronchial asthma   at present time started 3 days ago . Depression  . GERD (gastroesophageal reflux disease)  . Heart murmur   hx . Hepatitis   hepB 30 years ago . Hypertension  . Pneumonia 15  hx . UTI (lower urinary tract infection)   hx Past Surgical History: Past Surgical History: Procedure Laterality Date . EYE SURGERY Bilateral  . hammer toes    10-15 years ago both feet . JOINT REPLACEMENT    right knee . TOTAL KNEE ARTHROPLASTY Left 02/07/2015  Procedure: TOTAL KNEE ARTHROPLASTY;  Surgeon: Jodi Geralds, MD;   Location: [facility] OR;  Service: Orthopedics;  Laterality: Left; HPI: Pt is an 85 year old female who presented to [facility] on 12/27 with shortness of breath on exertion and cough. ETT 12/29, self-extubated 12/30. Bronch 12/30 showed severe tracheobronchomalacia causing complete collapse of the right trachea at the level of the ETT; thick mucus noted and suctioned. Per MD note, family reported that pt had a swallow [facility] and d/c on regular diet and thin liquids; however, they report intermittent coughing and trouble swallowing. PMH also includes: PNA, HTN, hepatitis, GERD, depression, asthma, anxiety  Subjective: pt describes trouble with solids going down Assessment / Plan / Recommendation CHL IP CLINICAL IMPRESSIONS 10/03/2018 Clinical Impression Pt has a mild oropharyngeal dysphagia with suspected esophageal component as well. She has very mild oral residue and L buccal pocketing, which she self-regulates. Premature spillage occurs primarily to the valleculae with thin liquids, although in one instance, when trying to swallow the pill, the thin liquids spilled into the airway before the swallow. Aspiration was  silent and could not be cleared despite cued cough. No other aspiration was observed, but it should be noted that pt primarily took very small, single boluses even with Max cues and encouragement from SLP for more challenging. Pt does appear to have a protruding pharyngeal wall (unclear if these are soft tissue changes?) but this does not seem to significantly impact swallow function. During brief esophageal scan, there did appear to be some barium that remained in the esophagus/was slow to clear (MD not present to confirm). Suspect that pt may have been having some silent aspiration since CVA over the summer, although daughter reports that she did not begin to develop respiratory issues until she discharged from SNF to ALF, where she became more sedentary and would often lie  down after eating. Suspect that a prandial and post-prandial component could be contributing. Discussed precautions like small bites/sips, remaining upright during and after meals, and avoiding mixed consistencies. We also discussed the importance of oral hygiene and an active lifestyle. Will start a Dys 3 diet and thin liquids with these precautions. Pt would also likely benefit from EMST to strengthen her muscles of respiration and cough to maximize airway protection. SLP Visit Diagnosis Dysphagia, oropharyngeal phase (R13.12) Attention and concentration deficit following -- Frontal lobe and executive function deficit following -- Impact on safety and function Moderate aspiration risk   CHL IP TREATMENT RECOMMENDATION 10/03/2018 Treatment Recommendations Therapy as outlined in treatment plan below   Prognosis 10/03/2018 Prognosis for Safe Diet Advancement Good Barriers to Reach Goals -- Barriers/Prognosis Comment -- CHL IP DIET RECOMMENDATION 10/03/2018 SLP Diet Recommendations Dysphagia 3 (Mech soft) solids;Thin liquid Liquid Administration via Cup;Straw Medication Administration Whole meds with puree Compensations Slow rate;Small sips/bites Postural Changes Remain semi-upright after after feeds/meals (Comment);Seated upright at 90 degrees   CHL IP OTHER RECOMMENDATIONS 10/03/2018 Recommended Consults Consider esophageal assessment Oral Care Recommendations Oral care BID Other Recommendations --   CHL IP FOLLOW UP RECOMMENDATIONS 10/03/2018 Follow up Recommendations (No Data)   CHL IP FREQUENCY AND DURATION 10/03/2018 Speech Therapy Frequency (ACUTE ONLY) min 2x/week Treatment Duration 2 weeks      CHL IP ORAL PHASE 10/03/2018 Oral Phase Impaired Oral - Pudding Teaspoon -- Oral - Pudding Cup -- Oral - Honey Teaspoon -- Oral - Honey Cup -- Oral - Nectar Teaspoon -- Oral - Nectar Cup -- Oral - Nectar Straw -- Oral - Thin Teaspoon -- Oral - Thin Cup Lingual/palatal residue Oral - Thin Straw Lingual/palatal  residue;Premature spillage Oral - Puree Left pocketing in lateral sulci Oral - Mech Soft Left pocketing in lateral sulci Oral - Regular -- Oral - Multi-Consistency -- Oral - Pill Delayed oral transit Oral Phase - Comment --  CHL IP PHARYNGEAL PHASE 10/03/2018 Pharyngeal Phase Impaired Pharyngeal- Pudding Teaspoon -- Pharyngeal -- Pharyngeal- Pudding Cup -- Pharyngeal -- Pharyngeal- Honey Teaspoon -- Pharyngeal -- Pharyngeal- Honey Cup -- Pharyngeal -- Pharyngeal- Nectar Teaspoon -- Pharyngeal -- Pharyngeal- Nectar Cup -- Pharyngeal -- Pharyngeal- Nectar Straw -- Pharyngeal -- Pharyngeal- Thin Teaspoon -- Pharyngeal -- Pharyngeal- Thin Cup Delayed swallow initiation-vallecula Pharyngeal -- Pharyngeal- Thin Straw Delayed swallow initiation-pyriform sinuses;Penetration/Aspiration before swallow Pharyngeal Material enters airway, passes BELOW cords without attempt by patient to eject out (silent aspiration) Pharyngeal- Puree WFL Pharyngeal -- Pharyngeal- Mechanical Soft WFL Pharyngeal -- Pharyngeal- Regular -- Pharyngeal -- Pharyngeal- Multi-consistency -- Pharyngeal -- Pharyngeal- Pill WFL Pharyngeal -- Pharyngeal Comment --  CHL IP CERVICAL ESOPHAGEAL PHASE 10/03/2018 Cervical Esophageal Phase WFL Pudding Teaspoon -- Pudding Cup -- Honey Teaspoon --  Honey Cup -- Nectar Teaspoon -- Nectar Cup -- Nectar Straw -- Thin Teaspoon -- Thin Cup -- Thin Straw -- Puree -- Mechanical Soft -- Regular -- Multi-consistency -- Pill -- Cervical Esophageal Comment -- Maxcine Ham 10/03/2018, 3:21 PM  Maxcine Ham, M.A. CCC-SLP Acute Rehabilitation Services Pager [phone] Office [phone]               Huey Bienenstock M.D on 10/06/2018 at 2:44 PM  Between 7am to 7pm - Pager - [phone]  After 7pm go to [email or web address] - password [facility]  [facility] -  Office  [phone]

## 2018-10-07 DIAGNOSIS — J690 Pneumonitis due to inhalation of food and vomit: Secondary | ICD-10-CM

## 2018-10-07 LAB — GLUCOSE, CAPILLARY: Glucose-Capillary: 126 mg/dL — ABNORMAL HIGH (ref 70–99)

## 2018-10-07 MED ORDER — POLYETHYLENE GLYCOL 3350 17 G PO PACK
17.0000 g | PACK | Freq: Two times a day (BID) | ORAL | Status: DC
  Administered 2018-10-07 – 2018-10-08 (×3): 17 g via ORAL
  Filled 2018-10-07 (×4): qty 1

## 2018-10-07 MED ORDER — BISACODYL 5 MG PO TBEC
5.0000 mg | DELAYED_RELEASE_TABLET | Freq: Once | ORAL | Status: AC
  Administered 2018-10-07: 5 mg via ORAL
  Filled 2018-10-07: qty 1

## 2018-10-07 MED ORDER — POTASSIUM CHLORIDE CRYS ER 20 MEQ PO TBCR
40.0000 meq | EXTENDED_RELEASE_TABLET | Freq: Once | ORAL | Status: AC
  Administered 2018-10-07: 40 meq via ORAL
  Filled 2018-10-07: qty 2

## 2018-10-07 MED ORDER — SENNOSIDES-DOCUSATE SODIUM 8.6-50 MG PO TABS
3.0000 | ORAL_TABLET | Freq: Two times a day (BID) | ORAL | Status: DC
  Administered 2018-10-07 – 2018-10-09 (×4): 3 via ORAL
  Filled 2018-10-07 (×4): qty 3

## 2018-10-07 NOTE — Progress Notes (Signed)
PROGRESS NOTE                                                                                                                                                                                                             Patient Demographics:    Cindy Briggs, is a 85 y.o. female, DOB - [birth year], ZOX:[id number]  Admit date - 10/01/2018   Admitting Physician Coralyn Helling, MD  Outpatient Primary MD for the patient is System, Pcp Not In  LOS - 6   No chief complaint on file.      Brief Narrative   85 year old female presents to [facility] on 12/27 with shortness of breath with exertion and cough. On arrival to ED CXR with low lung volumes with bibasilar atelectasis, no fever or WBC. Oxygen saturation 90% on 2L [place]. On 12/28 patient required BiPAP for hypoxia. Diuresis 10lbs with Lasix. CT Chest with persistent opacification of the bronchus intermedius, right middle and right lowe lobe concerning for mucoid impaction vs neoplasm.  Given persistent hypoxia with BiPAP patient was intubated and transferred to [facility].    Reported recent extensive hospital stay at [facility] for suspected PNA, bronchoscopy performed, cultures negative, believed to be mucous plugging. Since discharge [facility]  12/27  Admitted to [facility]  12/29  Intubated, Transferred to [facility] 12/30  Bronch,    severe tracheobronchomalacia causing complete collapse of the right trachea at the level of the ETT, thick mucus noted and suctioned. 12/30  self extubated 12/31  To floor     Subjective:    Cindy Briggs today has, No headache, No chest pain, No abdominal pain , dyspnea has improved after using flutter valve, still reports cough, still productive.   Assessment  & Plan :    Active Problems:   Acute respiratory failure (HCC)   Ventilator dependent (HCC)   Acute Hypoxic Respiratory Failure with mucous plugging  -This is most likely in the  setting of aspiration, underlying dysphagia from her previous CVA, -Bronchoscopy 10/02/2018 as well showing severe tracheobronchomalacia -[facility], self extubated 10/02/2018. -Encouraged again to use a walker flutter valve, 20 spirometry, she remains on 3 L nasal cannula, encouraged her to sit on the chair . -Continue CPAP nightly, stented airways, in the setting of tracheobronchomalacia..  Aspiration  pneumonia -Seen by SLP, currently on dysphagia 3 with thin liquid  -She is on IV Zosyn, total for 1 week, today is day 6 out of 7  Hypertension -Continue with lisinopril   Hyperlipidemia  -Continue with aspirin, Lipitor   Chronic Renal Insuffiencey (Base Crt 1.1-1.3). -Continue at baseline, avoid nephrotoxic medication Volume Overload > Diuresis 10lbs at OSH.  Currently appears to be euvolemic, no further diuresis  Hypokalemia. - repleting  GERD. ?Dyshagia > Per Family patient has swallow [facility], was discharged on Regular diet with thin liquids; however, she does intermittently cough while eating and tends to have trouble with foods such as rice. P: PPI Continue SLP recommendations   Deconditioning  P: PT consult for possible SNF needs.     Code Status : Full  Family Communication  : D/W daughter at bedside  Disposition Plan  : SNF, hopefully in 1 to 2 days  Consults  :  PCCM  Procedures  : none  DVT Prophylaxis  :  [place] heparin  Lab Results  Component Value Date   PLT 191 10/06/2018    Antibiotics  :    Anti-infectives (From admission, onward)   Start     Dose/Rate Route Frequency Ordered Stop   10/02/18 1400  piperacillin-tazobactam (ZOSYN) IVPB 3.375 g     3.375 g 12.5 mL/hr over 240 Minutes Intravenous Every 8 hours 10/02/18 1148     10/02/18 0000  azithromycin (ZITHROMAX) 500 mg in sodium chloride 0.9 % 250 mL IVPB  Status:  Discontinued     500 mg 250 mL/hr over 60 Minutes Intravenous  Every 24 hours 10/01/18 2333 10/02/18 1227        Objective:   Vitals:   10/07/18 0500 10/07/18 0520 10/07/18 0830 10/07/18 1109  BP:  106/60    Pulse:  70 77 75  Resp:   16 18  Temp:  98.3 F (36.8 C)    TempSrc:      SpO2:  98% 97% 97%  Weight: 87.5 kg     Height:        Wt Readings from Last 3 Encounters:  10/07/18 87.5 kg  03/13/15 83.5 kg  02/07/15 79.4 kg    No intake or output data in the 24 hours ending 10/07/18 1315   Physical Exam  Awake Alert, Oriented X 3, No new F.N deficits, Normal affect Symmetrical Chest wall movement, air entry at the bases, still with some rhonchi. RRR,No Gallops,Rubs or new Murmurs, No Parasternal Heave +ve B.Sounds, Abd Soft, No tenderness, No rebound - guarding or rigidity. No Cyanosis, Clubbing or edema, No new Rash or bruise      Data Review:    CBC Recent Labs  Lab 10/01/18 2006 10/02/18 0722 10/03/18 0710 10/05/18 1141 10/06/18 0503  WBC 13.2* 15.4* 12.0* 10.3 10.7*  HGB 11.6* 12.4 11.3* 10.5* 10.3*  HCT 37.7 40.7 36.4 34.9* 33.3*  PLT 243 207 210 196 191  MCV 91.7 91.5 93.3 93.3 94.1  MCH 28.2 27.9 29.0 28.1 29.1  MCHC 30.8 30.5 31.0 30.1 30.9  RDW 15.5 15.3 15.8* 15.6* 15.7*    Chemistries  Recent Labs  Lab 10/01/18 2006 10/02/18 0722 10/03/18 0710 10/05/18 1141 10/06/18 0503  NA 141 142 144 144 142  K 3.9 3.5 3.1* 3.5 3.2*  CL 101 102 107 109 108  CO2 30 28 29 27 26   GLUCOSE 134* 124* 90 107* 111*  BUN 32* 35* 30* 18 15  CREATININE 1.29*  1.21* 1.31* 1.23* 1.18*  CALCIUM 8.8* 8.8* 8.6* 8.4* 8.2*  MG 2.3 2.4 2.4  --   --   AST 35  --   --   --   --   ALT 33  --   --   --   --   ALKPHOS 84  --   --   --   --   BILITOT 0.7  --   --   --   --    ------------------------------------------------------------------------------------------------------------------ No results for input(s): CHOL, HDL, LDLCALC, TRIG, CHOLHDL, LDLDIRECT in the last 72 hours.  No results found for:  HGBA1C ------------------------------------------------------------------------------------------------------------------ No results for input(s): TSH, T4TOTAL, T3FREE, THYROIDAB in the last 72 hours.  Invalid input(s): FREET3 ------------------------------------------------------------------------------------------------------------------ No results for input(s): VITAMINB12, FOLATE, FERRITIN, TIBC, IRON, RETICCTPCT in the last 72 hours.  Coagulation profile No results for input(s): INR, PROTIME in the last 168 hours.  No results for input(s): DDIMER in the last 72 hours.  Cardiac Enzymes No results for input(s): CKMB, TROPONINI, MYOGLOBIN in the last 168 hours.  Invalid input(s): CK ------------------------------------------------------------------------------------------------------------------ No results found for: BNP  Inpatient Medications  Scheduled Meds: . aspirin  81 mg Oral Daily  . atorvastatin  40 mg Oral q1800  . bisacodyl  5 mg Oral Once  . DULoxetine  60 mg Oral Daily  . fluticasone furoate-vilanterol  1 puff Inhalation Daily  . heparin  5,000 Units Subcutaneous Q8H  . ipratropium-albuterol  3 mL Nebulization QID  . lisinopril  10 mg Oral Daily  . pantoprazole  40 mg Oral Daily  . polyethylene glycol  17 g Oral BID  . potassium chloride  40 mEq Oral Once  . pregabalin  300 mg Oral BID  . senna-docusate  3 tablet Oral BID   Continuous Infusions: . piperacillin-tazobactam (ZOSYN)  IV 3.375 g (10/07/18 0515)   PRN Meds:.albuterol, Melatonin  Micro Results Recent Results (from the past 240 hour(s))  Respiratory Panel by PCR     Status: None   Collection Time: 10/01/18  7:47 PM  Result Value Ref Range Status   Adenovirus NOT DETECTED NOT DETECTED Final   Coronavirus 229E NOT DETECTED NOT DETECTED Final   Coronavirus HKU1 NOT DETECTED NOT DETECTED Final   Coronavirus NL63 NOT DETECTED NOT DETECTED Final   Coronavirus OC43 NOT DETECTED NOT DETECTED Final    Metapneumovirus NOT DETECTED NOT DETECTED Final   Rhinovirus / Enterovirus NOT DETECTED NOT DETECTED Final   Influenza A NOT DETECTED NOT DETECTED Final   Influenza B NOT DETECTED NOT DETECTED Final   Parainfluenza Virus 1 NOT DETECTED NOT DETECTED Final   Parainfluenza Virus 2 NOT DETECTED NOT DETECTED Final   Parainfluenza Virus 3 NOT DETECTED NOT DETECTED Final   Parainfluenza Virus 4 NOT DETECTED NOT DETECTED Final   Respiratory Syncytial Virus NOT DETECTED NOT DETECTED Final   Bordetella pertussis NOT DETECTED NOT DETECTED Final   Chlamydophila pneumoniae NOT DETECTED NOT DETECTED Final   Mycoplasma pneumoniae NOT DETECTED NOT DETECTED Final    Comment: Performed at [facility], [address], [place], [place] [address]  MRSA PCR Screening     Status: None   Collection Time: 10/01/18  7:47 PM  Result Value Ref Range Status   MRSA by PCR NEGATIVE NEGATIVE Final    Comment:        The GeneXpert MRSA Assay (FDA approved for NASAL specimens only), is one component of a comprehensive MRSA colonization surveillance program. It is not intended to diagnose  MRSA infection nor to guide or monitor treatment for MRSA infections. Performed at [facility], [address], [place], [place] [address]   Culture, respiratory (non-expectorated)     Status: None   Collection Time: 10/01/18  9:48 PM  Result Value Ref Range Status   Specimen Description TRACHEAL ASPIRATE  Final   Special Requests NONE  Final   Gram Stain   Final    ABUNDANT WBC PRESENT, PREDOMINANTLY PMN RARE GRAM POSITIVE COCCI Performed at [facility], [address] [place], [place] [address]    Culture FEW Consistent with normal respiratory flora.  Final   Report Status 10/04/2018 FINAL  Final    Radiology Reports Dg Chest Port 1 View  Result Date: 10/03/2018 CLINICAL DATA:  Respiratory failure. EXAM: PORTABLE CHEST 1 VIEW COMPARISON:  10/01/2018. FINDINGS: Interim removal of endotracheal tube  and NG tube. What appears to be the ascending aorta is accentuated, this most likely second patient rotation/positioning. Heart size normal. Bibasilar atelectasis again noted. Mild infiltrate left lung base could not be excluded on today's exam. Small left pleural effusion. No pneumothorax. IMPRESSION: Bibasilar atelectasis again noted. Developing left base infiltrate on today's exam can be excluded. Small left pleural effusion. Electronically Signed   By: Maisie Fus  Register   On: 10/03/2018 05:38   Dg Chest Port 1 View  Result Date: 10/01/2018 CLINICAL DATA:  Ventilator dependent. EXAM: PORTABLE CHEST 1 VIEW COMPARISON:  Radiograph of October 01, 2018. FINDINGS: Stable cardiac size. Endotracheal and nasogastric tubes are unchanged in position. Stable calcified left hilar lymph nodes are noted. No pneumothorax or pleural effusion is noted. Mild bibasilar subsegmental atelectasis is noted. Bony thorax is unremarkable. IMPRESSION: Stable support apparatus.  Mild bibasilar subsegmental atelectasis. Electronically Signed   By: Lupita Raider, M.D.   On: 10/01/2018 20:44   Dg Swallowing Func-speech Pathology  Result Date: 10/03/2018 Objective Swallowing Evaluation: Type of Study: MBS-Modified Barium Swallow Study  Patient Details Name: KEARY HANAK MRN: [id number] Date of Birth: 1931-06-20 Today's Date: 10/03/2018 Time: SLP Start Time (ACUTE ONLY): 1317 -SLP Stop Time (ACUTE ONLY): 1354 SLP Time Calculation (min) (ACUTE ONLY): 37 min Past Medical History: Past Medical History: Diagnosis Date . Anxiety  . Arthritis  . Asthma   as a child . Bronchial asthma   at present time started 3 days ago . Depression  . GERD (gastroesophageal reflux disease)  . Heart murmur   hx . Hepatitis   hepB 30 years ago . Hypertension  . Pneumonia 15  hx . UTI (lower urinary tract infection)   hx Past Surgical History: Past Surgical History: Procedure Laterality Date . EYE SURGERY Bilateral  . hammer toes    10-15 years ago both feet .  JOINT REPLACEMENT    right knee . TOTAL KNEE ARTHROPLASTY Left 02/07/2015  Procedure: TOTAL KNEE ARTHROPLASTY;  Surgeon: Jodi Geralds, MD;  Location: [facility] OR;  Service: Orthopedics;  Laterality: Left; HPI: Pt is an 85 year old female who presented to [facility] on 12/27 with shortness of breath on exertion and cough. ETT 12/29, self-extubated 12/30. Bronch 12/30 showed severe tracheobronchomalacia causing complete collapse of the right trachea at the level of the ETT; thick mucus noted and suctioned. Per MD note, family reported that pt had a swallow evaluation ~1 month ago at [facility] and d/c on regular diet and thin liquids; however, they report intermittent coughing and trouble swallowing. PMH also includes: PNA, HTN, hepatitis, GERD, depression, asthma, anxiety  Subjective: pt describes  trouble with solids going down Assessment / Plan / Recommendation CHL IP CLINICAL IMPRESSIONS 10/03/2018 Clinical Impression Pt has a mild oropharyngeal dysphagia with suspected esophageal component as well. She has very mild oral residue and L buccal pocketing, which she self-regulates. Premature spillage occurs primarily to the valleculae with thin liquids, although in one instance, when trying to swallow the pill, the thin liquids spilled into the airway before the swallow. Aspiration was silent and could not be cleared despite cued cough. No other aspiration was observed, but it should be noted that pt primarily took very small, single boluses even with Max cues and encouragement from SLP for more challenging. Pt does appear to have a protruding pharyngeal wall (unclear if these are soft tissue changes?) but this does not seem to significantly impact swallow function. During brief esophageal scan, there did appear to be some barium that remained in the esophagus/was slow to clear (MD not present to confirm). Suspect that pt may have been having some silent aspiration since CVA over the summer, although daughter reports that  she did not begin to develop respiratory issues until she discharged from SNF to ALF, where she became more sedentary and would often lie down after eating. Suspect that a prandial and post-prandial component could be contributing. Discussed precautions like small bites/sips, remaining upright during and after meals, and avoiding mixed consistencies. We also discussed the importance of oral hygiene and an active lifestyle. Will start a Dys 3 diet and thin liquids with these precautions. Pt would also likely benefit from EMST to strengthen her muscles of respiration and cough to maximize airway protection. SLP Visit Diagnosis Dysphagia, oropharyngeal phase (R13.12) Attention and concentration deficit following -- Frontal lobe and executive function deficit following -- Impact on safety and function Moderate aspiration risk   CHL IP TREATMENT RECOMMENDATION 10/03/2018 Treatment Recommendations Therapy as outlined in treatment plan below   Prognosis 10/03/2018 Prognosis for Safe Diet Advancement Good Barriers to Reach Goals -- Barriers/Prognosis Comment -- CHL IP DIET RECOMMENDATION 10/03/2018 SLP Diet Recommendations Dysphagia 3 (Mech soft) solids;Thin liquid Liquid Administration via Cup;Straw Medication Administration Whole meds with puree Compensations Slow rate;Small sips/bites Postural Changes Remain semi-upright after after feeds/meals (Comment);Seated upright at 90 degrees   CHL IP OTHER RECOMMENDATIONS 10/03/2018 Recommended Consults Consider esophageal assessment Oral Care Recommendations Oral care BID Other Recommendations --   CHL IP FOLLOW UP RECOMMENDATIONS 10/03/2018 Follow up Recommendations (No Data)   CHL IP FREQUENCY AND DURATION 10/03/2018 Speech Therapy Frequency (ACUTE ONLY) min 2x/week Treatment Duration 2 weeks      CHL IP ORAL PHASE 10/03/2018 Oral Phase Impaired Oral - Pudding Teaspoon -- Oral - Pudding Cup -- Oral - Honey Teaspoon -- Oral - Honey Cup -- Oral - Nectar Teaspoon -- Oral - Nectar  Cup -- Oral - Nectar Straw -- Oral - Thin Teaspoon -- Oral - Thin Cup Lingual/palatal residue Oral - Thin Straw Lingual/palatal residue;Premature spillage Oral - Puree Left pocketing in lateral sulci Oral - Mech Soft Left pocketing in lateral sulci Oral - Regular -- Oral - Multi-Consistency -- Oral - Pill Delayed oral transit Oral Phase - Comment --  CHL IP PHARYNGEAL PHASE 10/03/2018 Pharyngeal Phase Impaired Pharyngeal- Pudding Teaspoon -- Pharyngeal -- Pharyngeal- Pudding Cup -- Pharyngeal -- Pharyngeal- Honey Teaspoon -- Pharyngeal -- Pharyngeal- Honey Cup -- Pharyngeal -- Pharyngeal- Nectar Teaspoon -- Pharyngeal -- Pharyngeal- Nectar Cup -- Pharyngeal -- Pharyngeal- Nectar Straw -- Pharyngeal -- Pharyngeal- Thin Teaspoon -- Pharyngeal -- Pharyngeal- Thin Cup Delayed swallow initiation-vallecula Pharyngeal --  Pharyngeal- Thin Straw Delayed swallow initiation-pyriform sinuses;Penetration/Aspiration before swallow Pharyngeal Material enters airway, passes BELOW cords without attempt by patient to eject out (silent aspiration) Pharyngeal- Puree WFL Pharyngeal -- Pharyngeal- Mechanical Soft WFL Pharyngeal -- Pharyngeal- Regular -- Pharyngeal -- Pharyngeal- Multi-consistency -- Pharyngeal -- Pharyngeal- Pill WFL Pharyngeal -- Pharyngeal Comment --  CHL IP CERVICAL ESOPHAGEAL PHASE 10/03/2018 Cervical Esophageal Phase WFL Pudding Teaspoon -- Pudding Cup -- Honey Teaspoon -- Honey Cup -- Nectar Teaspoon -- Nectar Cup -- Nectar Straw -- Thin Teaspoon -- Thin Cup -- Thin Straw -- Puree -- Mechanical Soft -- Regular -- Multi-consistency -- Pill -- Cervical Esophageal Comment -- Maxcine Hamaiewonsky, Laura 10/03/2018, 3:21 PM  Maxcine HamLaura Paiewonsky, M.A. CCC-SLP Acute Rehabilitation Services Pager [phone] Office [phone]               Huey Bienenstockawood Lille Karim M.D on 10/07/2018 at 1:15 PM  Between 7am to 7pm - Pager - [phone]  After 7pm go to [email or web address] - password [email or web address]  [facility] -  Office  [phone]

## 2018-10-07 NOTE — Progress Notes (Signed)
Patient requested her CPAP to be taken off. Placed on 3 /[place]. Rested well this shift with no issues. Will continue to monitor.

## 2018-10-08 LAB — CBC
HCT: 35.9 % — ABNORMAL LOW (ref 36.0–46.0)
HEMOGLOBIN: 11 g/dL — AB (ref 12.0–15.0)
MCH: 29.3 pg (ref 26.0–34.0)
MCHC: 30.6 g/dL (ref 30.0–36.0)
MCV: 95.5 fL (ref 80.0–100.0)
Platelets: 215 10*3/uL (ref 150–400)
RBC: 3.76 MIL/uL — ABNORMAL LOW (ref 3.87–5.11)
RDW: 15.9 % — ABNORMAL HIGH (ref 11.5–15.5)
WBC: 10.5 10*3/uL (ref 4.0–10.5)
nRBC: 0 % (ref 0.0–0.2)

## 2018-10-08 LAB — BASIC METABOLIC PANEL
ANION GAP: 8 (ref 5–15)
BUN: 11 mg/dL (ref 8–23)
CO2: 24 mmol/L (ref 22–32)
Calcium: 8.7 mg/dL — ABNORMAL LOW (ref 8.9–10.3)
Chloride: 108 mmol/L (ref 98–111)
Creatinine, Ser: 1.16 mg/dL — ABNORMAL HIGH (ref 0.44–1.00)
GFR calc Af Amer: 50 mL/min — ABNORMAL LOW (ref >60)
GFR calc non Af Amer: 43 mL/min — ABNORMAL LOW (ref >60)
Glucose, Bld: 100 mg/dL — ABNORMAL HIGH (ref 70–99)
POTASSIUM: 4.7 mmol/L (ref 3.5–5.1)
Sodium: 140 mmol/L (ref 135–145)

## 2018-10-08 LAB — GLUCOSE, CAPILLARY
GLUCOSE-CAPILLARY: 87 mg/dL (ref 70–99)
Glucose-Capillary: 93 mg/dL (ref 70–99)

## 2018-10-08 NOTE — Progress Notes (Signed)
PROGRESS NOTE                                                                                                                                                                                                             Patient Demographics:    Cindy Briggs, is a 85 y.o. female, DOB - [birth year], WJX:[id number]  Admit date - 10/01/2018   Admitting Physician Coralyn HellingVineet Sood, MD  Outpatient Primary MD for the patient is System, Pcp Not In  LOS - 7   No chief complaint on file.      Brief Narrative   85 year old female presents to [facility] on 12/[facility] with shortness of breath with exertion and cough. On arrival to ED CXR with low lung volumes with bibasilar atelectasis, no fever or WBC. Oxygen saturation 90% on 2L [place]. On 12/28 patient required BiPAP for hypoxia. Diuresis 10lbs with Lasix. CT Chest with persistent opacification of the bronchus intermedius, right middle and right lowe lobe concerning for mucoid impaction vs neoplasm.  Given persistent hypoxia with BiPAP patient was intubated and [facility].    Reported recent extensive hospital stay [facility] for suspected [facility], bronchoscopy performed, cultures negative, believed to be mucous plugging. Since discharge has been staying at [facility]  12/27  [facility]  12/29  Intubated, Transfer[facility] 12/30  Bronch,    severe tracheobronchomalacia causing complete collapse of the right trachea at the level of the ETT, thick mucus noted and suctioned. 12/30  self extubated 12/31  To floor     Subjective:    Cindy RegalJean Briggs today has, No headache, No chest pain, No abdominal pain , dyspnea has improved after using flutter valve, scab has subsided, currently nonproductive.   Assessment  & Plan :    Active Problems:   Acute respiratory failure (HCC)   Ventilator dependent (HCC)   Acute Hypoxic Respiratory Failure with mucous plugging  -This is most likely in  the setting of aspiration, underlying dysphagia from her previous CVA, -Bronchoscopy 10/02/2018 as well showing severe tracheobronchomalacia -Intubated on admission at [facility], self extubated 10/02/2018. -Encouraged again to use a walker flutter valve, incentive spirometry, today she is on 2 L nasal cannula-Continue CPAP nightly, to stent airways, in the setting of tracheobronchomalacia..  Aspiration pneumonia -Seen by SLP, currently on dysphagia  3 with thin liquid  -She is on IV Zosyn, with a total of 7 days, will DC IV antibiotics today  Hypertension -Continue with lisinopril   Hyperlipidemia  -Continue with aspirin, Lipitor   Chronic Renal Insuffiencey (Base Crt 1.1-1.3). -Continue at baseline, avoid nephrotoxic medication Volume Overload > Diuresis 10lbs at OSH.  Currently appears to be euvolemic, no further diuresis  Hypokalemia. - repleting  GERD. ?Dyshagia > Per Family patient has swallow evaluation about one month ago at [facility], was discharged on Regular diet with thin liquids; however, she does intermittently cough while eating and tends to have trouble with foods such as rice. P: PPI Continue SLP recommendations   Deconditioning  P: PT consult for possible SNF needs.     Code Status : Full  Family Communication  : None at bedside  Disposition Plan  : SNF,  tomorrow  Consults  :  PCCM  Procedures  : none  DVT Prophylaxis  :  [place] heparin  Lab Results  Component Value Date   PLT 215 10/08/2018    Antibiotics  :    Anti-infectives (From admission, onward)   Start     Dose/Rate Route Frequency Ordered Stop   10/02/18 1400  piperacillin-tazobactam (ZOSYN) IVPB 3.375 g     3.375 g 12.5 mL/hr over 240 Minutes Intravenous Every 8 hours 10/02/18 1148     10/02/18 0000  azithromycin (ZITHROMAX) 500 mg in sodium chloride 0.9 % 250 mL IVPB  Status:  Discontinued     500 mg 250 mL/hr over 60 Minutes Intravenous Every 24 hours 10/01/18 2333 10/02/18 1227         Objective:   Vitals:   10/07/18 2326 10/08/18 0431 10/08/18 0801 10/08/18 1221  BP:  120/66    Pulse: 78 77 77   Resp: 18 20 18    Temp:  97.7 F (36.5 C)    TempSrc:  Oral    SpO2: 92% 96% 99% 94%  Weight:  88.8 kg    Height:        Wt Readings from Last 3 Encounters:  10/08/18 88.8 kg  03/13/15 83.5 kg  02/07/15 79.4 kg     Intake/Output Summary (Last 24 hours) at 10/08/2018 1422 Last data filed at 10/08/2018 1359 Gross per 24 hour  Intake 400 ml  Output 850 ml  Net -450 ml     Physical Exam  Awake Alert, Oriented X 3, No new F.N deficits, Normal affect Symmetrical Chest wall movement, Good air movement bilaterally, CTAB RRR,No Gallops,Rubs or new Murmurs, No Parasternal Heave +ve B.Sounds, Abd Soft, No tenderness, No rebound - guarding or rigidity. No Cyanosis, Clubbing or edema, No new Rash or bruise      Data Review:    CBC Recent Labs  Lab 10/02/18 0722 10/03/18 0710 10/05/18 1141 10/06/18 0503 10/08/18 0311  WBC 15.4* 12.0* 10.3 10.7* 10.5  HGB 12.4 11.3* 10.5* 10.3* 11.0*  HCT 40.7 36.4 34.9* 33.3* 35.9*  PLT 207 210 196 191 215  MCV 91.5 93.3 93.3 94.1 95.5  MCH 27.9 29.0 28.1 29.1 29.3  MCHC 30.5 31.0 30.1 30.9 30.6  RDW 15.3 15.8* 15.6* 15.7* 15.9*    Chemistries  Recent Labs  Lab 10/01/18 2006 10/02/18 0722 10/03/18 0710 10/05/18 1141 10/06/18 0503 10/08/18 0311  NA 141 142 144 144 142 140  K 3.9 3.5 3.1* 3.5 3.2* 4.7  CL 101 102 107 109 108 108  CO2 30 28 29 27 26 24   GLUCOSE 134* 124* 90 107*  111* 100*  BUN 32* 35* 30* 18 15 11   CREATININE 1.29* 1.21* 1.31* 1.23* 1.18* 1.16*  CALCIUM 8.8* 8.8* 8.6* 8.4* 8.2* 8.7*  MG 2.3 2.4 2.4  --   --   --   AST 35  --   --   --   --   --   ALT 33  --   --   --   --   --   ALKPHOS 84  --   --   --   --   --   BILITOT 0.7  --   --   --   --   --    ------------------------------------------------------------------------------------------------------------------ No results for  input(s): CHOL, HDL, LDLCALC, TRIG, CHOLHDL, LDLDIRECT in the last 72 hours.  No results found for: HGBA1C ------------------------------------------------------------------------------------------------------------------ No results for input(s): TSH, T4TOTAL, T3FREE, THYROIDAB in the last 72 hours.  Invalid input(s): FREET3 ------------------------------------------------------------------------------------------------------------------ No results for input(s): VITAMINB12, FOLATE, FERRITIN, TIBC, IRON, RETICCTPCT in the last 72 hours.  Coagulation profile No results for input(s): INR, PROTIME in the last 168 hours.  No results for input(s): DDIMER in the last 72 hours.  Cardiac Enzymes No results for input(s): CKMB, TROPONINI, MYOGLOBIN in the last 168 hours.  Invalid input(s): CK ------------------------------------------------------------------------------------------------------------------ No results found for: BNP  Inpatient Medications  Scheduled Meds: . aspirin  81 mg Oral Daily  . atorvastatin  40 mg Oral q1800  . DULoxetine  60 mg Oral Daily  . fluticasone furoate-vilanterol  1 puff Inhalation Daily  . heparin  5,000 Units Subcutaneous Q8H  . ipratropium-albuterol  3 mL Nebulization QID  . lisinopril  10 mg Oral Daily  . pantoprazole  40 mg Oral Daily  . polyethylene glycol  17 g Oral BID  . pregabalin  300 mg Oral BID  . senna-docusate  3 tablet Oral BID   Continuous Infusions: . piperacillin-tazobactam (ZOSYN)  IV 3.375 g (10/08/18 0521)   PRN Meds:.albuterol, Melatonin  Micro Results Recent Results (from the past 240 hour(s))  Respiratory Panel by PCR     Status: None   Collection Time: 10/01/18  7:47 PM  Result Value Ref Range Status   Adenovirus NOT DETECTED NOT DETECTED Final   Coronavirus 229E NOT DETECTED NOT DETECTED Final   Coronavirus HKU1 NOT DETECTED NOT DETECTED Final   Coronavirus NL63 NOT DETECTED NOT DETECTED Final   Coronavirus OC43 NOT  DETECTED NOT DETECTED Final   Metapneumovirus NOT DETECTED NOT DETECTED Final   Rhinovirus / Enterovirus NOT DETECTED NOT DETECTED Final   Influenza A NOT DETECTED NOT DETECTED Final   Influenza B NOT DETECTED NOT DETECTED Final   Parainfluenza Virus 1 NOT DETECTED NOT DETECTED Final   Parainfluenza Virus 2 NOT DETECTED NOT DETECTED Final   Parainfluenza Virus 3 NOT DETECTED NOT DETECTED Final   Parainfluenza Virus 4 NOT DETECTED NOT DETECTED Final   Respiratory Syncytial Virus NOT DETECTED NOT DETECTED Final   Bordetella pertussis NOT DETECTED NOT DETECTED Final   Chlamydophila pneumoniae NOT DETECTED NOT DETECTED Final   Mycoplasma pneumoniae NOT DETECTED NOT DETECTED Final    Comment: Performed at [facility], [address] [place], [place] [address]  MRSA PCR Screening     Status: None   Collection Time: 10/01/18  7:47 PM  Result Value Ref Range Status   MRSA by PCR NEGATIVE NEGATIVE Final    Comment:        The GeneXpert MRSA Assay (FDA approved for NASAL specimens only), is one component  of a comprehensive MRSA colonization surveillance program. It is not intended to diagnose MRSA infection nor to guide or monitor treatment for MRSA infections. Performed at [facility], [address], [place], [place] [address]   Culture, respiratory (non-expectorated)     Status: None   Collection Time: 10/01/18  9:48 PM  Result Value Ref Range Status   Specimen Description TRACHEAL ASPIRATE  Final   Special Requests NONE  Final   Gram Stain   Final    ABUNDANT WBC PRESENT, PREDOMINANTLY PMN RARE GRAM POSITIVE COCCI Performed at [facility], [address], [place], [place] [address]    Culture FEW Consistent with normal respiratory flora.  Final   Report Status 10/04/2018 FINAL  Final    Radiology Reports Dg Chest Port 1 View  Result Date: 10/03/2018 CLINICAL DATA:  Respiratory failure. EXAM: PORTABLE CHEST 1 VIEW COMPARISON:  10/01/2018. FINDINGS:  Interim removal of endotracheal tube and NG tube. What appears to be the ascending aorta is accentuated, this most likely second patient rotation/positioning. Heart size normal. Bibasilar atelectasis again noted. Mild infiltrate left lung base could not be excluded on today's exam. Small left pleural effusion. No pneumothorax. IMPRESSION: Bibasilar atelectasis again noted. Developing left base infiltrate on today's exam can be excluded. Small left pleural effusion. Electronically Signed   By: Maisie Fus  Register   On: 10/03/2018 05:38   Dg Chest Port 1 View  Result Date: 10/01/2018 CLINICAL DATA:  Ventilator dependent. EXAM: PORTABLE CHEST 1 VIEW COMPARISON:  Radiograph of October 01, 2018. FINDINGS: Stable cardiac size. Endotracheal and nasogastric tubes are unchanged in position. Stable calcified left hilar lymph nodes are noted. No pneumothorax or pleural effusion is noted. Mild bibasilar subsegmental atelectasis is noted. Bony thorax is unremarkable. IMPRESSION: Stable support apparatus.  Mild bibasilar subsegmental atelectasis. Electronically Signed   By: Lupita Raider, M.D.   On: 10/01/2018 20:44   Dg Swallowing Func-speech Pathology  Result Date: 10/03/2018 Objective Swallowing Evaluation: Type of Study: MBS-Modified Barium Swallow Study  Patient Details Name: ISADORE BOKHARI MRN: [id number] Date of Birth: [birth year] Today's Date: 10/03/2018 Time: SLP Start Time (ACUTE ONLY): 1317 -SLP Stop Time (ACUTE ONLY): 1354 SLP Time Calculation (min) (ACUTE ONLY): 37 min Past Medical History: Past Medical History: Diagnosis Date . Anxiety  . Arthritis  . Asthma   as a child . Bronchial asthma   at present time started 3 days ago . Depression  . GERD (gastroesophageal reflux disease)  . Heart murmur   hx . Hepatitis   hepB 30 years ago . Hypertension  . Pneumonia 15  hx . UTI (lower urinary tract infection)   hx Past Surgical History: Past Surgical History: Procedure Laterality Date . EYE SURGERY Bilateral  . hammer  toes    10-15 years ago both feet . JOINT REPLACEMENT    right knee . TOTAL KNEE ARTHROPLASTY Left 02/07/2015  Procedure: TOTAL KNEE ARTHROPLASTY;  Surgeon: Jodi Geralds, MD;  Location: [facility] OR;  Service: Orthopedics;  Laterality: Left; HPI: Pt is an 85 year old female who presented to [facility] on 12/27 with shortness of breath on exertion and cough. ETT 12/29, self-extubated 12/30. Bronch 12/30 showed severe tracheobronchomalacia causing complete collapse of the right trachea at the level of the ETT; thick mucus noted and suctioned. Per MD note, family reported that pt had a swallow evaluation ~1 month ago at [facility] and d/c on regular diet and thin liquids; however, they report intermittent coughing and trouble swallowing. PMH  also includes: PNA, HTN, hepatitis, GERD, depression, asthma, anxiety  Subjective: pt describes trouble with solids going down Assessment / Plan / Recommendation CHL IP CLINICAL IMPRESSIONS 10/03/2018 Clinical Impression Pt has a mild oropharyngeal dysphagia with suspected esophageal component as well. She has very mild oral residue and L buccal pocketing, which she self-regulates. Premature spillage occurs primarily to the valleculae with thin liquids, although in one instance, when trying to swallow the pill, the thin liquids spilled into the airway before the swallow. Aspiration was silent and could not be cleared despite cued cough. No other aspiration was observed, but it should be noted that pt primarily took very small, single boluses even with Max cues and encouragement from SLP for more challenging. Pt does appear to have a protruding pharyngeal wall (unclear if these are soft tissue changes?) but this does not seem to significantly impact swallow function. During brief esophageal scan, there did appear to be some barium that remained in the esophagus/was slow to clear (MD not present to confirm). Suspect that pt may have been having some silent aspiration since CVA over the  summer, although daughter reports that she did not begin to develop respiratory issues until she discharged from SNF to ALF, where she became more sedentary and would often lie down after eating. Suspect that a prandial and post-prandial component could be contributing. Discussed precautions like small bites/sips, remaining upright during and after meals, and avoiding mixed consistencies. We also discussed the importance of oral hygiene and an active lifestyle. Will start a Dys 3 diet and thin liquids with these precautions. Pt would also likely benefit from EMST to strengthen her muscles of respiration and cough to maximize airway protection. SLP Visit Diagnosis Dysphagia, oropharyngeal phase (R13.12) Attention and concentration deficit following -- Frontal lobe and executive function deficit following -- Impact on safety and function Moderate aspiration risk   CHL IP TREATMENT RECOMMENDATION 10/03/2018 Treatment Recommendations Therapy as outlined in treatment plan below   Prognosis 10/03/2018 Prognosis for Safe Diet Advancement Good Barriers to Reach Goals -- Barriers/Prognosis Comment -- CHL IP DIET RECOMMENDATION 10/03/2018 SLP Diet Recommendations Dysphagia 3 (Mech soft) solids;Thin liquid Liquid Administration via Cup;Straw Medication Administration Whole meds with puree Compensations Slow rate;Small sips/bites Postural Changes Remain semi-upright after after feeds/meals (Comment);Seated upright at 90 degrees   CHL IP OTHER RECOMMENDATIONS 10/03/2018 Recommended Consults Consider esophageal assessment Oral Care Recommendations Oral care BID Other Recommendations --   CHL IP FOLLOW UP RECOMMENDATIONS 10/03/2018 Follow up Recommendations (No Data)   CHL IP FREQUENCY AND DURATION 10/03/2018 Speech Therapy Frequency (ACUTE ONLY) min 2x/week Treatment Duration 2 weeks      CHL IP ORAL PHASE 10/03/2018 Oral Phase Impaired Oral - Pudding Teaspoon -- Oral - Pudding Cup -- Oral - Honey Teaspoon -- Oral - Honey Cup --  Oral - Nectar Teaspoon -- Oral - Nectar Cup -- Oral - Nectar Straw -- Oral - Thin Teaspoon -- Oral - Thin Cup Lingual/palatal residue Oral - Thin Straw Lingual/palatal residue;Premature spillage Oral - Puree Left pocketing in lateral sulci Oral - Mech Soft Left pocketing in lateral sulci Oral - Regular -- Oral - Multi-Consistency -- Oral - Pill Delayed oral transit Oral Phase - Comment --  CHL IP PHARYNGEAL PHASE 10/03/2018 Pharyngeal Phase Impaired Pharyngeal- Pudding Teaspoon -- Pharyngeal -- Pharyngeal- Pudding Cup -- Pharyngeal -- Pharyngeal- Honey Teaspoon -- Pharyngeal -- Pharyngeal- Honey Cup -- Pharyngeal -- Pharyngeal- Nectar Teaspoon -- Pharyngeal -- Pharyngeal- Nectar Cup -- Pharyngeal -- Pharyngeal- Nectar Straw -- Pharyngeal --  Pharyngeal- Thin Teaspoon -- Pharyngeal -- Pharyngeal- Thin Cup Delayed swallow initiation-vallecula Pharyngeal -- Pharyngeal- Thin Straw Delayed swallow initiation-pyriform sinuses;Penetration/Aspiration before swallow Pharyngeal Material enters airway, passes BELOW cords without attempt by patient to eject out (silent aspiration) Pharyngeal- Puree WFL Pharyngeal -- Pharyngeal- Mechanical Soft WFL Pharyngeal -- Pharyngeal- Regular -- Pharyngeal -- Pharyngeal- Multi-consistency -- Pharyngeal -- Pharyngeal- Pill WFL Pharyngeal -- Pharyngeal Comment --  CHL IP CERVICAL ESOPHAGEAL PHASE 10/03/2018 Cervical Esophageal Phase WFL Pudding Teaspoon -- Pudding Cup -- Honey Teaspoon -- Honey Cup -- Nectar Teaspoon -- Nectar Cup -- Nectar Straw -- Thin Teaspoon -- Thin Cup -- Thin Straw -- Puree -- Mechanical Soft -- Regular -- Multi-consistency -- Pill -- Cervical Esophageal Comment -- Maxcine Ham 10/03/2018, 3:21 PM  Maxcine Ham, M.A. CCC-SLP Acute Rehabilitation Services Pager [phone] Office [phone]               Huey Bienenstock M.D on 10/08/2018 at 2:22 PM  Between 7am to 7pm - Pager - [phone]  After 7pm go to [email or web address] - password [facility]  [facility] -  Office  [phone]

## 2018-10-09 DIAGNOSIS — N189 Chronic kidney disease, unspecified: Secondary | ICD-10-CM | POA: Diagnosis not present

## 2018-10-09 DIAGNOSIS — J440 Chronic obstructive pulmonary disease with acute lower respiratory infection: Secondary | ICD-10-CM | POA: Diagnosis not present

## 2018-10-09 DIAGNOSIS — R1312 Dysphagia, oropharyngeal phase: Secondary | ICD-10-CM | POA: Diagnosis not present

## 2018-10-09 DIAGNOSIS — Z7401 Bed confinement status: Secondary | ICD-10-CM | POA: Diagnosis not present

## 2018-10-09 DIAGNOSIS — Z7409 Other reduced mobility: Secondary | ICD-10-CM | POA: Diagnosis not present

## 2018-10-09 DIAGNOSIS — K219 Gastro-esophageal reflux disease without esophagitis: Secondary | ICD-10-CM | POA: Diagnosis not present

## 2018-10-09 DIAGNOSIS — R278 Other lack of coordination: Secondary | ICD-10-CM | POA: Diagnosis not present

## 2018-10-09 DIAGNOSIS — I959 Hypotension, unspecified: Secondary | ICD-10-CM | POA: Diagnosis not present

## 2018-10-09 DIAGNOSIS — J9600 Acute respiratory failure, unspecified whether with hypoxia or hypercapnia: Secondary | ICD-10-CM | POA: Diagnosis not present

## 2018-10-09 DIAGNOSIS — M2550 Pain in unspecified joint: Secondary | ICD-10-CM | POA: Diagnosis not present

## 2018-10-09 DIAGNOSIS — M6281 Muscle weakness (generalized): Secondary | ICD-10-CM | POA: Diagnosis not present

## 2018-10-09 DIAGNOSIS — R0602 Shortness of breath: Secondary | ICD-10-CM | POA: Diagnosis not present

## 2018-10-09 DIAGNOSIS — R2681 Unsteadiness on feet: Secondary | ICD-10-CM | POA: Diagnosis not present

## 2018-10-09 DIAGNOSIS — E785 Hyperlipidemia, unspecified: Secondary | ICD-10-CM | POA: Diagnosis not present

## 2018-10-09 DIAGNOSIS — I10 Essential (primary) hypertension: Secondary | ICD-10-CM | POA: Diagnosis not present

## 2018-10-09 DIAGNOSIS — J189 Pneumonia, unspecified organism: Secondary | ICD-10-CM | POA: Diagnosis not present

## 2018-10-09 DIAGNOSIS — R0989 Other specified symptoms and signs involving the circulatory and respiratory systems: Secondary | ICD-10-CM | POA: Diagnosis not present

## 2018-10-09 DIAGNOSIS — J9601 Acute respiratory failure with hypoxia: Secondary | ICD-10-CM | POA: Diagnosis not present

## 2018-10-09 DIAGNOSIS — R262 Difficulty in walking, not elsewhere classified: Secondary | ICD-10-CM | POA: Diagnosis not present

## 2018-10-09 MED ORDER — ACETAMINOPHEN 500 MG PO TABS: 500.0000 mg | ORAL_TABLET | ORAL | 0 refills | Status: AC | PRN

## 2018-10-09 MED ORDER — ENSURE ENLIVE PO LIQD: 237.0000 mL | Freq: Two times a day (BID) | ORAL | 12 refills | Status: AC

## 2018-10-09 MED ORDER — POLYETHYLENE GLYCOL 3350 17 G PO PACK: 17.0000 g | PACK | Freq: Every day | ORAL | 0 refills | Status: AC | PRN

## 2018-10-09 MED ORDER — SENNOSIDES-DOCUSATE SODIUM 8.6-50 MG PO TABS: 3.0000 | ORAL_TABLET | Freq: Two times a day (BID) | ORAL | Status: AC

## 2018-10-09 MED ORDER — ALBUTEROL SULFATE (2.5 MG/3ML) 0.083% IN NEBU: 2.5000 mg | INHALATION_SOLUTION | RESPIRATORY_TRACT | 12 refills | Status: AC | PRN

## 2018-10-09 MED ORDER — ENSURE ENLIVE PO LIQD
237.0000 mL | Freq: Two times a day (BID) | ORAL | Status: DC
  Administered 2018-10-09: 237 mL via ORAL

## 2018-10-09 NOTE — Progress Notes (Signed)
Pt has been on a CPAP at night, auto titrate min P 6 and max P 20 with 3lpm O2 bleed in.  Pt placed on 3lpm [place] during the day.

## 2018-10-09 NOTE — Discharge Summary (Signed)
Grace BushyJean O Schwenn, is a 85 y.o. female  DOB 12-13-1930  MRN [id number].  Admission date:  10/01/2018  Admitting Physician  Coralyn HellingVineet Sood, MD  Discharge Date:  10/09/2018   Primary MD  System, Pcp Not In  Recommendations for primary care physician for things to follow:  -Please check CBC, BMP in 3 days -wean Oxygen as tolerated -Patient will need sleep study as an outpatient. - Pt needs CPAP at night, auto titrate min P 6 and max P 20 with 3lpm O2 bleed in. -Patient full  aspiration precautions, continue SLP evaluation at facility  Admission Diagnosis  Respiratory Failure   Discharge Diagnosis  Respiratory Failure    Active Problems:   Acute respiratory failure (HCC)   Ventilator dependent (HCC)      Past Medical History:  Diagnosis Date  . Anxiety   . Arthritis   . Asthma    as a child  . Bronchial asthma    at present time started 3 days ago  . Depression   . GERD (gastroesophageal reflux disease)   . Heart murmur    hx  . Hepatitis    hepB 30 years ago  . Hypertension   . Pneumonia 15   hx  . UTI (lower urinary tract infection)    hx    Past Surgical History:  Procedure Laterality Date  . EYE SURGERY Bilateral   . hammer toes     10-15 years ago both feet  . JOINT REPLACEMENT     right knee  . TOTAL KNEE ARTHROPLASTY Left 02/07/2015   Procedure: TOTAL KNEE ARTHROPLASTY;  Surgeon: Jodi GeraldsJohn Graves, MD;  Location: [facility] OR;  Service: Orthopedics;  Laterality: Left;       History of present illness and  Hospital Course:     Kindly see H&P for history of present illness and admission details, please review complete Labs, Consult reports and Test reports for all details in brief  HPI  from the history and physical done on the day of admission 10/01/2018  85 year old female [facility] on [facility] with shortness of breath with exertion and cough. On arrival to ED CXR with low  lung volumes with bibasilar atelectasis, no fever or WBC. Oxygen saturation 90% on 2L [place]. On 12/28 patient required BiPAP for hypoxia. Diuresis 10lbs with Lasix. CT Chest with persistent opacification of the bronchus intermedius, right middle and right lowe lobe concerning for mucoid impaction vs neoplasm. Given persistent hypoxia with BiPAP patient was intubated and [facility] to [facility].    Reported recent extensive hospital stay [facility] for suspected P[facility], bronchoscopy performed, cultures negative, believed to be mucous plugging. Since discharge [facility].   Hospital Course    85 year old female [facility] on [facility]/27 with shortness of breath with exertion and cough. On arrival to ED CXR with low lung volumes with bibasilar atelectasis, no fever or WBC. Oxygen saturation 90% on 2L [place]. On 12/28 patient required BiPAP  for hypoxia. Diuresis 10lbs with Lasix. CT Chest with persistent opacification of the bronchus intermedius, right middle and right lowe lobe concerning for mucoid impaction vs neoplasm. Given persistent hypoxia with BiPAP patient was intubated and transferred to [facility].   Reported recent extensive hospital stay at [facility] for suspected PNA, bronchoscopy performed, cultures negative, believed to be mucous plugging. Since discharge has been staying at [facility]  12/27 Admitted to [facility]  12/29 Intubated, Transferred to [facility] 12/30  Bronch,    severe tracheobronchomalacia causing complete collapse of the right trachea at the level of the ETT, thick mucus noted and suctioned. 12/30 self extubated 12/31 To floor   Acute Hypoxic Respiratory Failure with mucous plugging  -This is most likely in the setting of aspiration, underlying dysphagia from her previous CVA, -Bronchoscopy 10/02/2018 as well showing severe tracheobronchomalacia -Intubated on [facility] at [facility], self extubated  10/02/2018. -Encouraged again to use a walker flutter valve, incentive spirometry, while she was encouraged to take him and continue he was at facility -She is on 2 to 3 L nasal cannula, continue to wean as tolerated  Severe tracheobronchomalacia -Continue CPAP nightly, to stent airways, in the setting of tracheobronchomalacia..  Aspiration pneumonia -Seen by SLP, currently on dysphagia 3 with thin liquid  -She is on IV Zosyn,  treated with  total of 7 days of IV Zosyn  Hypertension -Continue with lisinopril   Hyperlipidemia  -Continue with aspirin, Lipitor   Chronic Renal Insuffiencey (Base Crt 1.1-1.3). -Continue at baseline, avoid nephrotoxic medication -Doing home dose Lasix on discharge  Hypokalemia. - repleting  GERD. -  PPI  Dyshagia -  Per Family patient has swallow evaluation about one month ago at [facility], was discharged on Regular diet with thin liquids; however, she does intermittently cough while eating and tends to have trouble with foods such as rice. - Continue SLP recommendations, stage III, with thin liquid diet, follow aspiration precaution, continued SLP follow-up at facility  Deconditioning  P: PT consult for possible SNF needs.    Discharge Condition:  stable       Discharge Instructions  and  Discharge Medications    Discharge Instructions    Discharge instructions   Complete by:  As directed    Follow with Primary MD System, Pcp Not In in 7 days   Get CBC, CMP, 2 view Chest X ray checked  by Primary MD next visit.    Activity: As tolerated with Full fall precautions use walker/cane & assistance as needed   Disposition SNF   Diet: Dysphagia 3, with thin liquid, heart Healthy , with feeding assistance and aspiration precautions.   On your next visit with your primary care physician please Get Medicines reviewed and adjusted.   Please request your Prim.MD to go over all Hospital Tests and Procedure/Radiological results  at the follow up, please get all Hospital records sent to your Prim MD by signing hospital release before you go home.   If you experience worsening of your admission symptoms, develop shortness of breath, life threatening emergency, suicidal or homicidal thoughts you must seek medical attention immediately by calling 911 or calling your MD immediately  if symptoms less severe.  You Must read complete instructions/literature along with all the possible adverse reactions/side effects for all the Medicines you take and that have been prescribed to you. Take any new Medicines after you have completely understood and accpet all the possible adverse reactions/side effects.   Do not drive, operating heavy machinery,  perform activities at heights, swimming or participation in water activities or provide baby sitting services if your were admitted for syncope or siezures until you have seen by Primary MD or a Neurologist and advised to do so again.  Do not drive when taking Pain medications.    Do not take more than prescribed Pain, Sleep and Anxiety Medications  Special Instructions: If you have smoked or chewed Tobacco  in the last 2 yrs please stop smoking, stop any regular Alcohol  and or any Recreational drug use.  Wear Seat belts while driving.   Please note  You were cared for by a hospitalist during your hospital stay. If you have any questions about your discharge medications or the care you received while you were in the hospital after you are discharged, you can call the unit and asked to speak with the hospitalist on call if the hospitalist that took care of you is not available. Once you are discharged, your primary care physician will handle any further medical issues. Please note that NO REFILLS for any discharge medications will be authorized once you are discharged, as it is imperative that you return to your primary care physician (or establish a relationship with a primary care  physician if you do not have one) for your aftercare needs so that they can reassess your need for medications and monitor your lab values.   Increase activity slowly   Complete by:  As directed      Allergies as of 10/09/2018      Reactions   Latex Dermatitis, Rash      Medication List    STOP taking these medications   budesonide-formoterol 160-4.5 MCG/ACT inhaler Commonly known as:  SYMBICORT   cephALEXin 250 MG capsule Commonly known as:  KEFLEX   cephALEXin 500 MG capsule Commonly known as:  KEFLEX   chlorthalidone 25 MG tablet Commonly known as:  HYGROTON   diphenoxylate-atropine 2.5-0.025 MG tablet Commonly known as:  LOMOTIL   gabapentin 300 MG capsule Commonly known as:  NEURONTIN   meloxicam 15 MG tablet Commonly known as:  MOBIC   methocarbamol 500 MG tablet Commonly known as:  ROBAXIN   oxyCODONE-acetaminophen 5-325 MG tablet Commonly known as:  PERCOCET/ROXICET   potassium chloride 10 MEQ tablet Commonly known as:  K-DUR   pravastatin 20 MG tablet Commonly known as:  PRAVACHOL     TAKE these medications   acetaminophen 500 MG tablet Commonly known as:  TYLENOL Take 1 tablet (500 mg total) by mouth every 4 (four) hours as needed. What changed:  how much to take   albuterol (2.5 MG/3ML) 0.083% nebulizer solution Commonly known as:  PROVENTIL Take 3 mLs (2.5 mg total) by nebulization every 4 (four) hours as needed for wheezing.   aspirin EC 325 MG tablet Take 1 tablet (325 mg total) by mouth 2 (two) times daily after a meal. Take x 1 month post op to decrease risk of blood clots.   atorvastatin 40 MG tablet Commonly known as:  LIPITOR Take 40 mg by mouth daily.   BREO ELLIPTA 100-25 MCG/INH Aepb Generic drug:  fluticasone furoate-vilanterol Inhale 1 puff into the lungs at bedtime.   calcium-vitamin D 500-200 MG-UNIT tablet Commonly known as:  OSCAL WITH D Take 1 tablet by mouth 2 (two) times daily.   DULoxetine 60 MG capsule Commonly  known as:  CYMBALTA TAKE 1 CAPSULE BY MOUTH EVERY DAY What changed:  how much to take   feeding supplement (ENSURE ENLIVE)  Liqd Take 237 mLs by mouth 2 (two) times daily between meals.   fluticasone 50 MCG/ACT nasal spray Commonly known as:  FLONASE Place 2 sprays into both nostrils daily.   furosemide 40 MG tablet Commonly known as:  LASIX TAKE 1 TABLET BY MOUTH EVERY DAY   guaifenesin 100 MG/5ML syrup Commonly known as:  ROBITUSSIN Take 200 mg by mouth every 6 (six) hours as needed for cough (for 3 days).   ipratropium-albuterol 0.5-2.5 (3) MG/3ML Soln Commonly known as:  DUONEB Take 3 mLs by nebulization 4 (four) times daily. What changed:  Another medication with the same name was removed. Continue taking this medication, and follow the directions you see here.   lisinopril 5 MG tablet Commonly known as:  PRINIVIL,ZESTRIL Take 5 mg by mouth daily. What changed:  Another medication with the same name was removed. Continue taking this medication, and follow the directions you see here.   loperamide 2 MG tablet Commonly known as:  IMODIUM A-D Take 2 mg by mouth 4 (four) times daily as needed for diarrhea or loose stools.   magnesium oxide 400 MG tablet Commonly known as:  MAG-OX Take 400 mg by mouth 2 (two) times daily.   Melatonin 3 MG Tabs Take 6 mg by mouth at bedtime.   MILK OF MAGNESIA PO Take 30 mLs by mouth daily as needed (constipation).   MYLANTA PO Take 30 mLs by mouth as needed (heartburn/ indigestion).   neomycin-bacitracin-polymyxin ointment Commonly known as:  NEOSPORIN Apply 1 application topically every 12 (twelve) hours.   nystatin cream Commonly known as:  MYCOSTATIN Apply 1 application topically See admin instructions. Spread topically to vaginal area every 6 hours as needed for irritation   pantoprazole 40 MG tablet Commonly known as:  PROTONIX Take 40 mg by mouth daily.   polyethylene glycol packet Commonly known as:  MIRALAX /  GLYCOLAX Take 17 g by mouth daily as needed.   pregabalin 300 MG capsule Commonly known as:  LYRICA Take 300 mg by mouth 2 (two) times daily.   senna-docusate 8.6-50 MG tablet Commonly known as:  Senokot-S Take 3 tablets by mouth 2 (two) times daily.   solifenacin 5 MG tablet Commonly known as:  VESICARE Take 5 mg by mouth at bedtime.   Vitamin D3 250 MCG (10000 UT) Tabs Take 2,000 Units by mouth daily.         Diet and Activity recommendation: See Discharge Instructions above   Consults obtained -    PCCM  Major procedures and Radiology Reports - PLEASE review detailed and final reports for all details, in brief -     Dg Chest Port 1 View  Result Date: 10/03/2018 CLINICAL DATA:  Respiratory failure. EXAM: PORTABLE CHEST 1 VIEW COMPARISON:  10/01/2018. FINDINGS: Interim removal of endotracheal tube and NG tube. What appears to be the ascending aorta is accentuated, this most likely second patient rotation/positioning. Heart size normal. Bibasilar atelectasis again noted. Mild infiltrate left lung base could not be excluded on today's exam. Small left pleural effusion. No pneumothorax. IMPRESSION: Bibasilar atelectasis again noted. Developing left base infiltrate on today's exam can be excluded. Small left pleural effusion. Electronically Signed   By: Maisie Fus  Register   On: 10/03/2018 05:38   Dg Chest Port 1 View  Result Date: 10/01/2018 CLINICAL DATA:  Ventilator dependent. EXAM: PORTABLE CHEST 1 VIEW COMPARISON:  Radiograph of October 01, 2018. FINDINGS: Stable cardiac size. Endotracheal and nasogastric tubes are unchanged in position. Stable calcified left hilar lymph  nodes are noted. No pneumothorax or pleural effusion is noted. Mild bibasilar subsegmental atelectasis is noted. Bony thorax is unremarkable. IMPRESSION: Stable support apparatus.  Mild bibasilar subsegmental atelectasis. Electronically Signed   By: Lupita Raider, M.D.   On: 10/01/2018 20:44   Dg  Swallowing Func-speech Pathology  Result Date: 10/03/2018 Objective Swallowing Evaluation: Type of Study: MBS-Modified Barium Swallow Study  Patient Details Name: Cindy Briggs MRN: [id number] Date of Birth: August 14, 1931 Today's Date: 10/03/2018 Time: SLP Start Time (ACUTE ONLY): 1317 -SLP Stop Time (ACUTE ONLY): 1354 SLP Time Calculation (min) (ACUTE ONLY): 37 min Past Medical History: Past Medical History: Diagnosis Date . Anxiety  . Arthritis  . Asthma   as a child . Bronchial asthma   at present time started 3 days ago . Depression  . GERD (gastroesophageal reflux disease)  . Heart murmur   hx . Hepatitis   hepB 30 years ago . Hypertension  . Pneumonia 15  hx . UTI (lower urinary tract infection)   hx Past Surgical History: Past Surgical History: Procedure Laterality Date . EYE SURGERY Bilateral  . hammer toes    10-15 years ago both feet . JOINT REPLACEMENT    right knee . TOTAL KNEE ARTHROPLASTY Left 02/07/2015  Procedure: TOTAL KNEE ARTHROPLASTY;  Surgeon: Jodi Geralds, MD;  Location: [facility] OR;  Service: Orthopedics;  Laterality: Left; HPI: Pt is an 85 year old female who [facility] to [facility] on [facility] with shortness of breath on exertion and cough. ETT 12/29, self-extubated 12/30. Bronch 12/30 showed severe tracheobronchomalacia causing complete collapse of the right trachea at the level of the ETT; thick mucus noted and suctioned. Per MD note, family reported that pt had a swallow evaluation ~1 month ago at [facility] and d/c on regular diet and thin liquids; however, they report intermittent coughing and trouble swallowing. PMH also includes: PNA, HTN, hepatitis, GERD, depression, asthma, anxiety  Subjective: pt describes trouble with solids going down Assessment / Plan / Recommendation CHL IP CLINICAL IMPRESSIONS 10/03/2018 Clinical Impression Pt has a mild oropharyngeal dysphagia with suspected esophageal component as well. She has very mild oral residue and L buccal pocketing, which she self-regulates.  Premature spillage occurs primarily to the valleculae with thin liquids, although in one instance, when trying to swallow the pill, the thin liquids spilled into the airway before the swallow. Aspiration was silent and could not be cleared despite cued cough. No other aspiration was observed, but it should be noted that pt primarily took very small, single boluses even with Max cues and encouragement from SLP for more challenging. Pt does appear to have a protruding pharyngeal wall (unclear if these are soft tissue changes?) but this does not seem to significantly impact swallow function. During brief esophageal scan, there did appear to be some barium that remained in the esophagus/was slow to clear (MD not present to confirm). Suspect that pt may have been having some silent aspiration since CVA over the summer, although daughter reports that she did not begin to develop respiratory issues until she discharged from SNF to ALF, where she became more sedentary and would often lie down after eating. Suspect that a prandial and post-prandial component could be contributing. Discussed precautions like small bites/sips, remaining upright during and after meals, and avoiding mixed consistencies. We also discussed the importance of oral hygiene and an active lifestyle. Will start a Dys 3 diet and thin liquids with these precautions. Pt would also likely benefit from EMST to strengthen her muscles of  respiration and cough to maximize airway protection. SLP Visit Diagnosis Dysphagia, oropharyngeal phase (R13.12) Attention and concentration deficit following -- Frontal lobe and executive function deficit following -- Impact on safety and function Moderate aspiration risk   CHL IP TREATMENT RECOMMENDATION 10/03/2018 Treatment Recommendations Therapy as outlined in treatment plan below   Prognosis 10/03/2018 Prognosis for Safe Diet Advancement Good Barriers to Reach Goals -- Barriers/Prognosis Comment -- CHL IP DIET  RECOMMENDATION 10/03/2018 SLP Diet Recommendations Dysphagia 3 (Mech soft) solids;Thin liquid Liquid Administration via Cup;Straw Medication Administration Whole meds with puree Compensations Slow rate;Small sips/bites Postural Changes Remain semi-upright after after feeds/meals (Comment);Seated upright at 90 degrees   CHL IP OTHER RECOMMENDATIONS 10/03/2018 Recommended Consults Consider esophageal assessment Oral Care Recommendations Oral care BID Other Recommendations --   CHL IP FOLLOW UP RECOMMENDATIONS 10/03/2018 Follow up Recommendations (No Data)   CHL IP FREQUENCY AND DURATION 10/03/2018 Speech Therapy Frequency (ACUTE ONLY) min 2x/week Treatment Duration 2 weeks      CHL IP ORAL PHASE 10/03/2018 Oral Phase Impaired Oral - Pudding Teaspoon -- Oral - Pudding Cup -- Oral - Honey Teaspoon -- Oral - Honey Cup -- Oral - Nectar Teaspoon -- Oral - Nectar Cup -- Oral - Nectar Straw -- Oral - Thin Teaspoon -- Oral - Thin Cup Lingual/palatal residue Oral - Thin Straw Lingual/palatal residue;Premature spillage Oral - Puree Left pocketing in lateral sulci Oral - Mech Soft Left pocketing in lateral sulci Oral - Regular -- Oral - Multi-Consistency -- Oral - Pill Delayed oral transit Oral Phase - Comment --  CHL IP PHARYNGEAL PHASE 10/03/2018 Pharyngeal Phase Impaired Pharyngeal- Pudding Teaspoon -- Pharyngeal -- Pharyngeal- Pudding Cup -- Pharyngeal -- Pharyngeal- Honey Teaspoon -- Pharyngeal -- Pharyngeal- Honey Cup -- Pharyngeal -- Pharyngeal- Nectar Teaspoon -- Pharyngeal -- Pharyngeal- Nectar Cup -- Pharyngeal -- Pharyngeal- Nectar Straw -- Pharyngeal -- Pharyngeal- Thin Teaspoon -- Pharyngeal -- Pharyngeal- Thin Cup Delayed swallow initiation-vallecula Pharyngeal -- Pharyngeal- Thin Straw Delayed swallow initiation-pyriform sinuses;Penetration/Aspiration before swallow Pharyngeal Material enters airway, passes BELOW cords without attempt by patient to eject out (silent aspiration) Pharyngeal- Puree WFL Pharyngeal --  Pharyngeal- Mechanical Soft WFL Pharyngeal -- Pharyngeal- Regular -- Pharyngeal -- Pharyngeal- Multi-consistency -- Pharyngeal -- Pharyngeal- Pill WFL Pharyngeal -- Pharyngeal Comment --  CHL IP CERVICAL ESOPHAGEAL PHASE 10/03/2018 Cervical Esophageal Phase WFL Pudding Teaspoon -- Pudding Cup -- Honey Teaspoon -- Honey Cup -- Nectar Teaspoon -- Nectar Cup -- Nectar Straw -- Thin Teaspoon -- Thin Cup -- Thin Straw -- Puree -- Mechanical Soft -- Regular -- Multi-consistency -- Pill -- Cervical Esophageal Comment -- Maxcine Hamaiewonsky, Laura 10/03/2018, 3:21 PM  Maxcine HamLaura Paiewonsky, M.A. CCC-SLP Acute Rehabilitation Services Pager [phone] Office [phone]              Micro Results    Recent Results (from the past 240 hour(s))  Respiratory Panel by PCR     Status: None   Collection Time: 10/01/18  7:47 PM  Result Value Ref Range Status   Adenovirus NOT DETECTED NOT DETECTED Final   Coronavirus 229E NOT DETECTED NOT DETECTED Final   Coronavirus HKU1 NOT DETECTED NOT DETECTED Final   Coronavirus NL63 NOT DETECTED NOT DETECTED Final   Coronavirus OC43 NOT DETECTED NOT DETECTED Final   Metapneumovirus NOT DETECTED NOT DETECTED Final   Rhinovirus / Enterovirus NOT DETECTED NOT DETECTED Final   Influenza A NOT DETECTED NOT DETECTED Final   Influenza B NOT DETECTED NOT DETECTED Final   Parainfluenza Virus 1 NOT DETECTED NOT DETECTED Final  Parainfluenza Virus 2 NOT DETECTED NOT DETECTED Final   Parainfluenza Virus 3 NOT DETECTED NOT DETECTED Final   Parainfluenza Virus 4 NOT DETECTED NOT DETECTED Final   Respiratory Syncytial Virus NOT DETECTED NOT DETECTED Final   Bordetella pertussis NOT DETECTED NOT DETECTED Final   Chlamydophila pneumoniae NOT DETECTED NOT DETECTED Final   Mycoplasma pneumoniae NOT DETECTED NOT DETECTED Final    Comment: Performed at [facility], [address] [place], [place] [address]  MRSA PCR Screening     Status: None   Collection Time: 10/01/18  7:47 PM  Result  Value Ref Range Status   MRSA by PCR NEGATIVE NEGATIVE Final    Comment:        The GeneXpert MRSA Assay (FDA approved for NASAL specimens only), is one component of a comprehensive MRSA colonization surveillance program. It is not intended to diagnose MRSA infection nor to guide or monitor treatment for MRSA infections. Performed at [facility], [address], [place], [place] [address]   Culture, respiratory (non-expectorated)     Status: None   Collection Time: 10/01/18  9:48 PM  Result Value Ref Range Status   Specimen Description TRACHEAL ASPIRATE  Final   Special Requests NONE  Final   Gram Stain   Final    ABUNDANT WBC PRESENT, PREDOMINANTLY PMN RARE GRAM POSITIVE COCCI Performed at [facility], [address], [place], [place] [address]    Culture FEW Consistent with normal respiratory flora.  Final   Report Status 10/04/2018 FINAL  Final       Today   Subjective:   Wyllow Hemmer today has no headache,no chest OR  abdominal pain,no new weakness tingling or numbness, feels much better  today.   Objective:   Blood pressure 111/62, pulse 72, temperature 98.2 F (36.8 C), temperature source Oral, resp. rate 18, height 5\' 6"  (1.676 m), weight 86 kg, SpO2 94 %.   Intake/Output Summary (Last 24 hours) at 10/09/2018 0946 Last data filed at 10/08/2018 1359 Gross per 24 hour  Intake -  Output 850 ml  Net -850 ml    Exam Awake Alert, Oriented x 3, No new F.N deficits, Normal affect Symmetrical Chest wall movement, Good air movement bilaterally, CTAB RRR,No Gallops,Rubs or new Murmurs, No Parasternal Heave +ve B.Sounds, Abd Soft, Non tender,  No rebound -guarding or rigidity. No Cyanosis, Clubbing or edema, No new Rash or bruise  Data Review   CBC w Diff:  Lab Results  Component Value Date   WBC 10.5 10/08/2018   HGB 11.0 (L) 10/08/2018   HCT 35.9 (L) 10/08/2018   PLT 215 10/08/2018   LYMPHOPCT 30 01/29/2015   MONOPCT 6 01/29/2015   EOSPCT 3  01/29/2015   BASOPCT 1 01/29/2015    CMP:  Lab Results  Component Value Date   NA 140 10/08/2018   K 4.7 10/08/2018   CL 108 10/08/2018   CO2 24 10/08/2018   BUN 11 10/08/2018   CREATININE 1.16 (H) 10/08/2018   PROT 6.4 (L) 10/01/2018   ALBUMIN 3.7 10/01/2018   BILITOT 0.7 10/01/2018   ALKPHOS 84 10/01/2018   AST 35 10/01/2018   ALT 33 10/01/2018  .   Total Time in preparing paper work, data evaluation and todays exam - 35 minutes  Huey Bienenstock M.D on 10/09/2018 at 9:46 AM  [facility]   Office  [phone]

## 2018-10-09 NOTE — Discharge Instructions (Signed)
Follow with Primary MD System, Pcp Not In in 7 days   Get CBC, CMP, 2 view Chest X ray checked  by Primary MD next visit.    Activity: As tolerated with Full fall precautions use walker/cane & assistance as needed   Disposition SNF   Diet: Dysphagia 3, with thin liquid, heart Healthy , with feeding assistance and aspiration precautions.   On your next visit with your primary care physician please Get Medicines reviewed and adjusted.   Please request your Prim.MD to go over all Hospital Tests and Procedure/Radiological results at the follow up, please get all Hospital records sent to your Prim MD by signing hospital release before you go home.   If you experience worsening of your admission symptoms, develop shortness of breath, life threatening emergency, suicidal or homicidal thoughts you must seek medical attention immediately by calling 911 or calling your MD immediately  if symptoms less severe.  You Must read complete instructions/literature along with all the possible adverse reactions/side effects for all the Medicines you take and that have been prescribed to you. Take any new Medicines after you have completely understood and accpet all the possible adverse reactions/side effects.   Do not drive, operating heavy machinery, perform activities at heights, swimming or participation in water activities or provide baby sitting services if your were admitted for syncope or siezures until you have seen by Primary MD or a Neurologist and advised to do so again.  Do not drive when taking Pain medications.    Do not take more than prescribed Pain, Sleep and Anxiety Medications  Special Instructions: If you have smoked or chewed Tobacco  in the last 2 yrs please stop smoking, stop any regular Alcohol  and or any Recreational drug use.  Wear Seat belts while driving.   Please note  You were cared for by a hospitalist during your hospital stay. If you have any questions about your  discharge medications or the care you received while you were in the hospital after you are discharged, you can call the unit and asked to speak with the hospitalist on call if the hospitalist that took care of you is not available. Once you are discharged, your primary care physician will handle any further medical issues. Please note that NO REFILLS for any discharge medications will be authorized once you are discharged, as it is imperative that you return to your primary care physician (or establish a relationship with a primary care physician if you do not have one) for your aftercare needs so that they can reassess your need for medications and monitor your lab values.

## 2018-10-09 NOTE — Progress Notes (Signed)
Patient will DC to: [facility] Anticipated DC date: 10/09/17 Family notified: Daughters-Karen, Aggie Cosier, and Pam Transport by: Sharin Mons   Per MD patient ready for DC to [facility]. RN, patient, patient's family, and facility notified of DC. Discharge Summary and FL2 sent to facility. RN to call report prior to discharge [id number] Room 114). DC packet on chart. Ambulance transport requested for patient.   CSW will sign off for now as social work intervention is no longer needed. Please consult [place] again if new needs arise.  Cristobal Goldmann, LCSW Clinical Social Worker [phone]

## 2018-10-09 NOTE — Clinical Social Work Placement (Signed)
   CLINICAL SOCIAL WORK PLACEMENT  NOTE  Date:  10/09/2018  Patient Details  Name: Cindy Briggs MRN: [id number] Date of Birth: [birth year]  Clinical Social Work is seeking post-discharge placement for this patient at the Skilled  Nursing Facility level of care (*CSW will initial, date and re-position this form in  chart as items are completed):  Yes   Patient/family provided with [place] Clinical Social Work Department's list of facilities offering this level of care within the geographic area requested by the patient (or if unable, by the patient's family).  Yes   Patient/family informed of their freedom to choose among providers that offer the needed level of care, that participate in Medicare, Medicaid or managed care program needed by the patient, have an available bed and are willing to accept the patient.  Yes   Patient/family informed of [facility]'s ownership interest in [facility] and [facility], as well as of the fact that they are under no obligation to receive care at these facilities.  PASRR submitted to EDS on       PASRR number received on       Existing PASRR number confirmed on 10/09/18     FL2 transmitted to all facilities in geographic area requested by pt/family on 10/09/18     FL2 transmitted to all facilities within larger geographic area on       Patient informed that his/her managed care company has contracts with or will negotiate with certain facilities, including the following:        Yes   Patient/family informed of bed offers received.  Patient chooses bed at [facility]     Physician recommends and patient chooses bed at      Patient to be transferred to [facility] on 10/09/18.  Patient to be transferred to facility by [facility]     Patient family notified on 10/09/18 of transfer.  Name of family member notified:  Clydie Braun, [place]     PHYSICIAN       Additional Comment:     _______________________________________________ Mearl Latin, LCSW 10/09/2018, 12:06 PM

## 2018-10-09 NOTE — Care Management Important Message (Signed)
Important Message  Patient Details  Name: Cindy Briggs MRN: [id number] Date of Birth: [birth year]   Medicare Important Message Given:  Yes    Dorena Bodo 10/09/2018, 4:15 PM

## 2018-10-09 NOTE — Plan of Care (Signed)

## 2018-10-16 DIAGNOSIS — Z95818 Presence of other cardiac implants and grafts: Secondary | ICD-10-CM | POA: Diagnosis not present

## 2018-10-16 DIAGNOSIS — M549 Dorsalgia, unspecified: Secondary | ICD-10-CM | POA: Diagnosis not present

## 2018-10-16 DIAGNOSIS — R2990 Unspecified symptoms and signs involving the nervous system: Secondary | ICD-10-CM | POA: Diagnosis not present

## 2018-10-16 DIAGNOSIS — H3530 Unspecified macular degeneration: Secondary | ICD-10-CM | POA: Diagnosis not present

## 2018-10-16 DIAGNOSIS — Z79899 Other long term (current) drug therapy: Secondary | ICD-10-CM | POA: Diagnosis not present

## 2018-10-16 DIAGNOSIS — Z955 Presence of coronary angioplasty implant and graft: Secondary | ICD-10-CM | POA: Diagnosis not present

## 2018-10-16 DIAGNOSIS — R1310 Dysphagia, unspecified: Secondary | ICD-10-CM | POA: Diagnosis not present

## 2018-10-16 DIAGNOSIS — R0689 Other abnormalities of breathing: Secondary | ICD-10-CM | POA: Diagnosis not present

## 2018-10-16 DIAGNOSIS — Z87891 Personal history of nicotine dependence: Secondary | ICD-10-CM | POA: Diagnosis not present

## 2018-10-16 DIAGNOSIS — I6521 Occlusion and stenosis of right carotid artery: Secondary | ICD-10-CM | POA: Diagnosis not present

## 2018-10-16 DIAGNOSIS — I10 Essential (primary) hypertension: Secondary | ICD-10-CM | POA: Diagnosis not present

## 2018-10-16 DIAGNOSIS — I6350 Cerebral infarction due to unspecified occlusion or stenosis of unspecified cerebral artery: Secondary | ICD-10-CM | POA: Diagnosis not present

## 2018-10-16 DIAGNOSIS — G8929 Other chronic pain: Secondary | ICD-10-CM | POA: Diagnosis not present

## 2018-10-16 DIAGNOSIS — I63231 Cerebral infarction due to unspecified occlusion or stenosis of right carotid arteries: Secondary | ICD-10-CM | POA: Diagnosis not present

## 2018-10-16 DIAGNOSIS — F418 Other specified anxiety disorders: Secondary | ICD-10-CM | POA: Diagnosis not present

## 2018-10-16 DIAGNOSIS — I63411 Cerebral infarction due to embolism of right middle cerebral artery: Secondary | ICD-10-CM | POA: Diagnosis not present

## 2018-10-16 DIAGNOSIS — Z7982 Long term (current) use of aspirin: Secondary | ICD-10-CM | POA: Diagnosis not present

## 2018-10-16 DIAGNOSIS — N183 Chronic kidney disease, stage 3 (moderate): Secondary | ICD-10-CM | POA: Diagnosis not present

## 2018-10-16 DIAGNOSIS — E781 Pure hyperglyceridemia: Secondary | ICD-10-CM | POA: Diagnosis not present

## 2018-10-16 DIAGNOSIS — I129 Hypertensive chronic kidney disease with stage 1 through stage 4 chronic kidney disease, or unspecified chronic kidney disease: Secondary | ICD-10-CM | POA: Diagnosis not present

## 2018-10-16 DIAGNOSIS — I69391 Dysphagia following cerebral infarction: Secondary | ICD-10-CM | POA: Diagnosis not present

## 2018-10-16 DIAGNOSIS — Z95828 Presence of other vascular implants and grafts: Secondary | ICD-10-CM | POA: Diagnosis not present

## 2018-10-20 DIAGNOSIS — M5430 Sciatica, unspecified side: Secondary | ICD-10-CM | POA: Diagnosis not present

## 2018-10-20 DIAGNOSIS — N390 Urinary tract infection, site not specified: Secondary | ICD-10-CM | POA: Diagnosis not present

## 2018-10-20 DIAGNOSIS — Z79899 Other long term (current) drug therapy: Secondary | ICD-10-CM | POA: Diagnosis not present

## 2018-10-20 DIAGNOSIS — E559 Vitamin D deficiency, unspecified: Secondary | ICD-10-CM | POA: Diagnosis not present

## 2018-10-20 DIAGNOSIS — J449 Chronic obstructive pulmonary disease, unspecified: Secondary | ICD-10-CM | POA: Diagnosis not present

## 2018-10-20 DIAGNOSIS — E785 Hyperlipidemia, unspecified: Secondary | ICD-10-CM | POA: Diagnosis not present

## 2018-10-20 DIAGNOSIS — I639 Cerebral infarction, unspecified: Secondary | ICD-10-CM | POA: Diagnosis not present

## 2018-11-15 DIAGNOSIS — J449 Chronic obstructive pulmonary disease, unspecified: Secondary | ICD-10-CM | POA: Diagnosis not present

## 2018-11-15 DIAGNOSIS — J398 Other specified diseases of upper respiratory tract: Secondary | ICD-10-CM | POA: Diagnosis not present

## 2019-02-12 DIAGNOSIS — N390 Urinary tract infection, site not specified: Secondary | ICD-10-CM | POA: Diagnosis not present

## 2019-02-13 DIAGNOSIS — N390 Urinary tract infection, site not specified: Secondary | ICD-10-CM | POA: Diagnosis not present

## 2019-02-13 DIAGNOSIS — N3946 Mixed incontinence: Secondary | ICD-10-CM | POA: Diagnosis not present

## 2019-02-13 DIAGNOSIS — N952 Postmenopausal atrophic vaginitis: Secondary | ICD-10-CM | POA: Diagnosis not present

## 2019-03-01 DIAGNOSIS — J449 Chronic obstructive pulmonary disease, unspecified: Secondary | ICD-10-CM | POA: Diagnosis not present

## 2019-03-01 DIAGNOSIS — E785 Hyperlipidemia, unspecified: Secondary | ICD-10-CM | POA: Diagnosis not present

## 2019-03-01 DIAGNOSIS — M5430 Sciatica, unspecified side: Secondary | ICD-10-CM | POA: Diagnosis not present

## 2019-03-01 DIAGNOSIS — F339 Major depressive disorder, recurrent, unspecified: Secondary | ICD-10-CM | POA: Diagnosis not present

## 2019-04-16 DIAGNOSIS — M5430 Sciatica, unspecified side: Secondary | ICD-10-CM | POA: Diagnosis not present

## 2019-04-16 DIAGNOSIS — K219 Gastro-esophageal reflux disease without esophagitis: Secondary | ICD-10-CM | POA: Diagnosis not present

## 2019-04-16 DIAGNOSIS — E785 Hyperlipidemia, unspecified: Secondary | ICD-10-CM | POA: Diagnosis not present

## 2019-04-16 DIAGNOSIS — J449 Chronic obstructive pulmonary disease, unspecified: Secondary | ICD-10-CM | POA: Diagnosis not present

## 2019-06-26 DIAGNOSIS — E559 Vitamin D deficiency, unspecified: Secondary | ICD-10-CM | POA: Diagnosis not present

## 2019-06-26 DIAGNOSIS — Z79899 Other long term (current) drug therapy: Secondary | ICD-10-CM | POA: Diagnosis not present

## 2019-06-26 DIAGNOSIS — I129 Hypertensive chronic kidney disease with stage 1 through stage 4 chronic kidney disease, or unspecified chronic kidney disease: Secondary | ICD-10-CM | POA: Diagnosis not present

## 2019-06-26 DIAGNOSIS — M5430 Sciatica, unspecified side: Secondary | ICD-10-CM | POA: Diagnosis not present

## 2019-06-26 DIAGNOSIS — J449 Chronic obstructive pulmonary disease, unspecified: Secondary | ICD-10-CM | POA: Diagnosis not present

## 2019-06-26 DIAGNOSIS — E785 Hyperlipidemia, unspecified: Secondary | ICD-10-CM | POA: Diagnosis not present

## 2019-06-26 DIAGNOSIS — N183 Chronic kidney disease, stage 3 (moderate): Secondary | ICD-10-CM | POA: Diagnosis not present

## 2019-08-24 DIAGNOSIS — Z8673 Personal history of transient ischemic attack (TIA), and cerebral infarction without residual deficits: Secondary | ICD-10-CM | POA: Diagnosis not present

## 2019-08-24 DIAGNOSIS — M25561 Pain in right knee: Secondary | ICD-10-CM | POA: Diagnosis not present

## 2019-08-24 DIAGNOSIS — N390 Urinary tract infection, site not specified: Secondary | ICD-10-CM | POA: Diagnosis not present

## 2019-09-12 DIAGNOSIS — E669 Obesity, unspecified: Secondary | ICD-10-CM | POA: Diagnosis not present

## 2019-09-12 DIAGNOSIS — G8929 Other chronic pain: Secondary | ICD-10-CM | POA: Diagnosis not present

## 2019-09-12 DIAGNOSIS — M25561 Pain in right knee: Secondary | ICD-10-CM | POA: Diagnosis not present

## 2019-10-17 DIAGNOSIS — Z23 Encounter for immunization: Secondary | ICD-10-CM | POA: Diagnosis not present

## 2019-12-17 DIAGNOSIS — N390 Urinary tract infection, site not specified: Secondary | ICD-10-CM | POA: Diagnosis not present

## 2019-12-17 DIAGNOSIS — J449 Chronic obstructive pulmonary disease, unspecified: Secondary | ICD-10-CM | POA: Diagnosis not present

## 2019-12-17 DIAGNOSIS — J069 Acute upper respiratory infection, unspecified: Secondary | ICD-10-CM | POA: Diagnosis not present

## 2020-01-09 DIAGNOSIS — Z79899 Other long term (current) drug therapy: Secondary | ICD-10-CM | POA: Diagnosis not present

## 2020-01-09 DIAGNOSIS — E559 Vitamin D deficiency, unspecified: Secondary | ICD-10-CM | POA: Diagnosis not present

## 2020-01-09 DIAGNOSIS — Z9181 History of falling: Secondary | ICD-10-CM | POA: Diagnosis not present

## 2020-01-09 DIAGNOSIS — Z0000 Encounter for general adult medical examination without abnormal findings: Secondary | ICD-10-CM | POA: Diagnosis not present

## 2020-01-09 DIAGNOSIS — E785 Hyperlipidemia, unspecified: Secondary | ICD-10-CM | POA: Diagnosis not present

## 2020-01-09 DIAGNOSIS — Z1331 Encounter for screening for depression: Secondary | ICD-10-CM | POA: Diagnosis not present

## 2020-01-09 DIAGNOSIS — M5430 Sciatica, unspecified side: Secondary | ICD-10-CM | POA: Diagnosis not present

## 2020-01-09 DIAGNOSIS — K219 Gastro-esophageal reflux disease without esophagitis: Secondary | ICD-10-CM | POA: Diagnosis not present

## 2020-01-09 DIAGNOSIS — Z6833 Body mass index (BMI) 33.0-33.9, adult: Secondary | ICD-10-CM | POA: Diagnosis not present

## 2020-01-09 DIAGNOSIS — Z139 Encounter for screening, unspecified: Secondary | ICD-10-CM | POA: Diagnosis not present

## 2020-01-10 IMAGING — DX DG CHEST 1V PORT
1 series · 1 of 1 positions shown · non-contrast
Comparison: Radiograph October 01, 2018.

CLINICAL DATA: Ventilator dependent.

EXAM:
PORTABLE CHEST 1 VIEW

[chest ap]
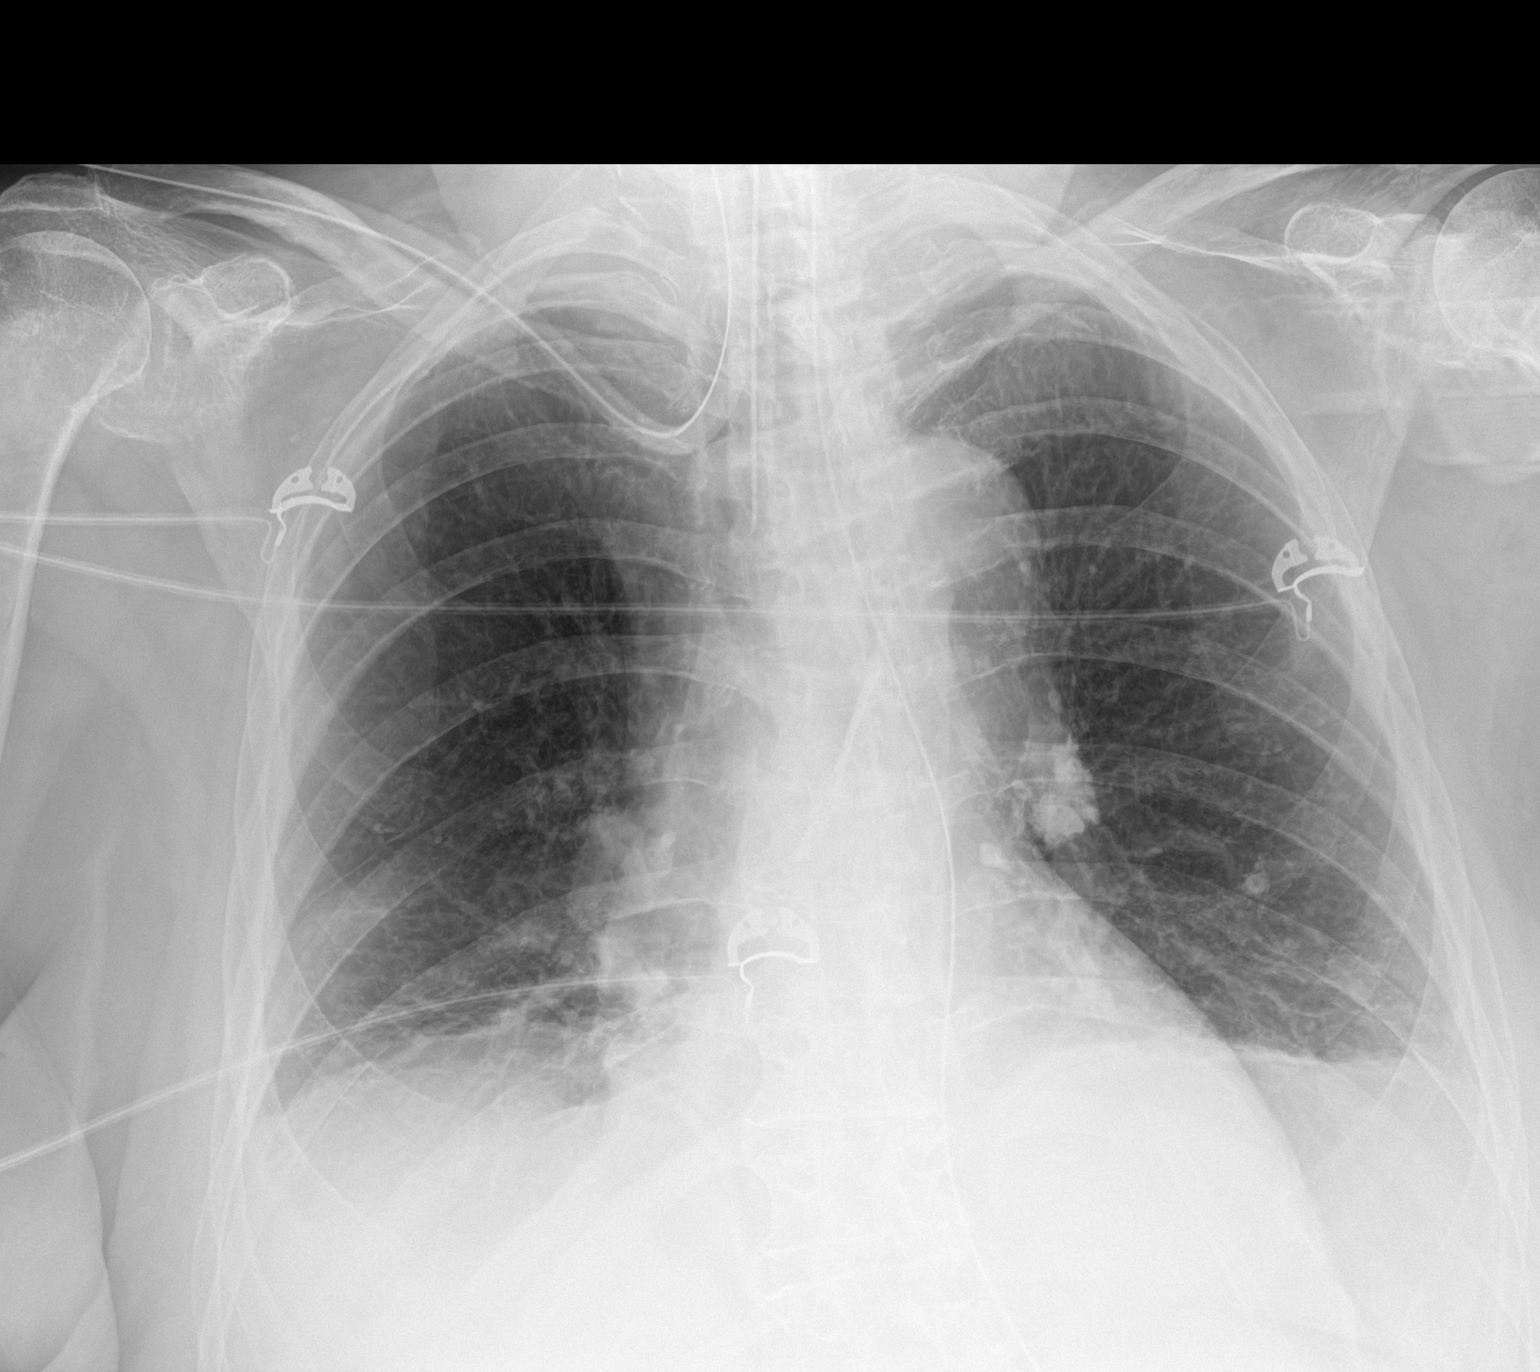

[1 of 1 positions shown; findings below may reference images not displayed]

FINDINGS: Stable cardiac size. Endotracheal and nasogastric tubes are
unchanged in position. Stable calcified left hilar lymph nodes are
noted. No pneumothorax or pleural effusion is noted. Mild bibasilar
subsegmental atelectasis is noted. Bony thorax is unremarkable.
IMPRESSION: Stable support apparatus.  Mild bibasilar subsegmental atelectasis.

## 2020-01-12 IMAGING — DX DG CHEST 1V PORT
1 series · 1 of 1 positions shown · non-contrast
Comparison: 10/01/2018.

CLINICAL DATA: Respiratory failure.

EXAM:
PORTABLE CHEST 1 VIEW

[chest ap]
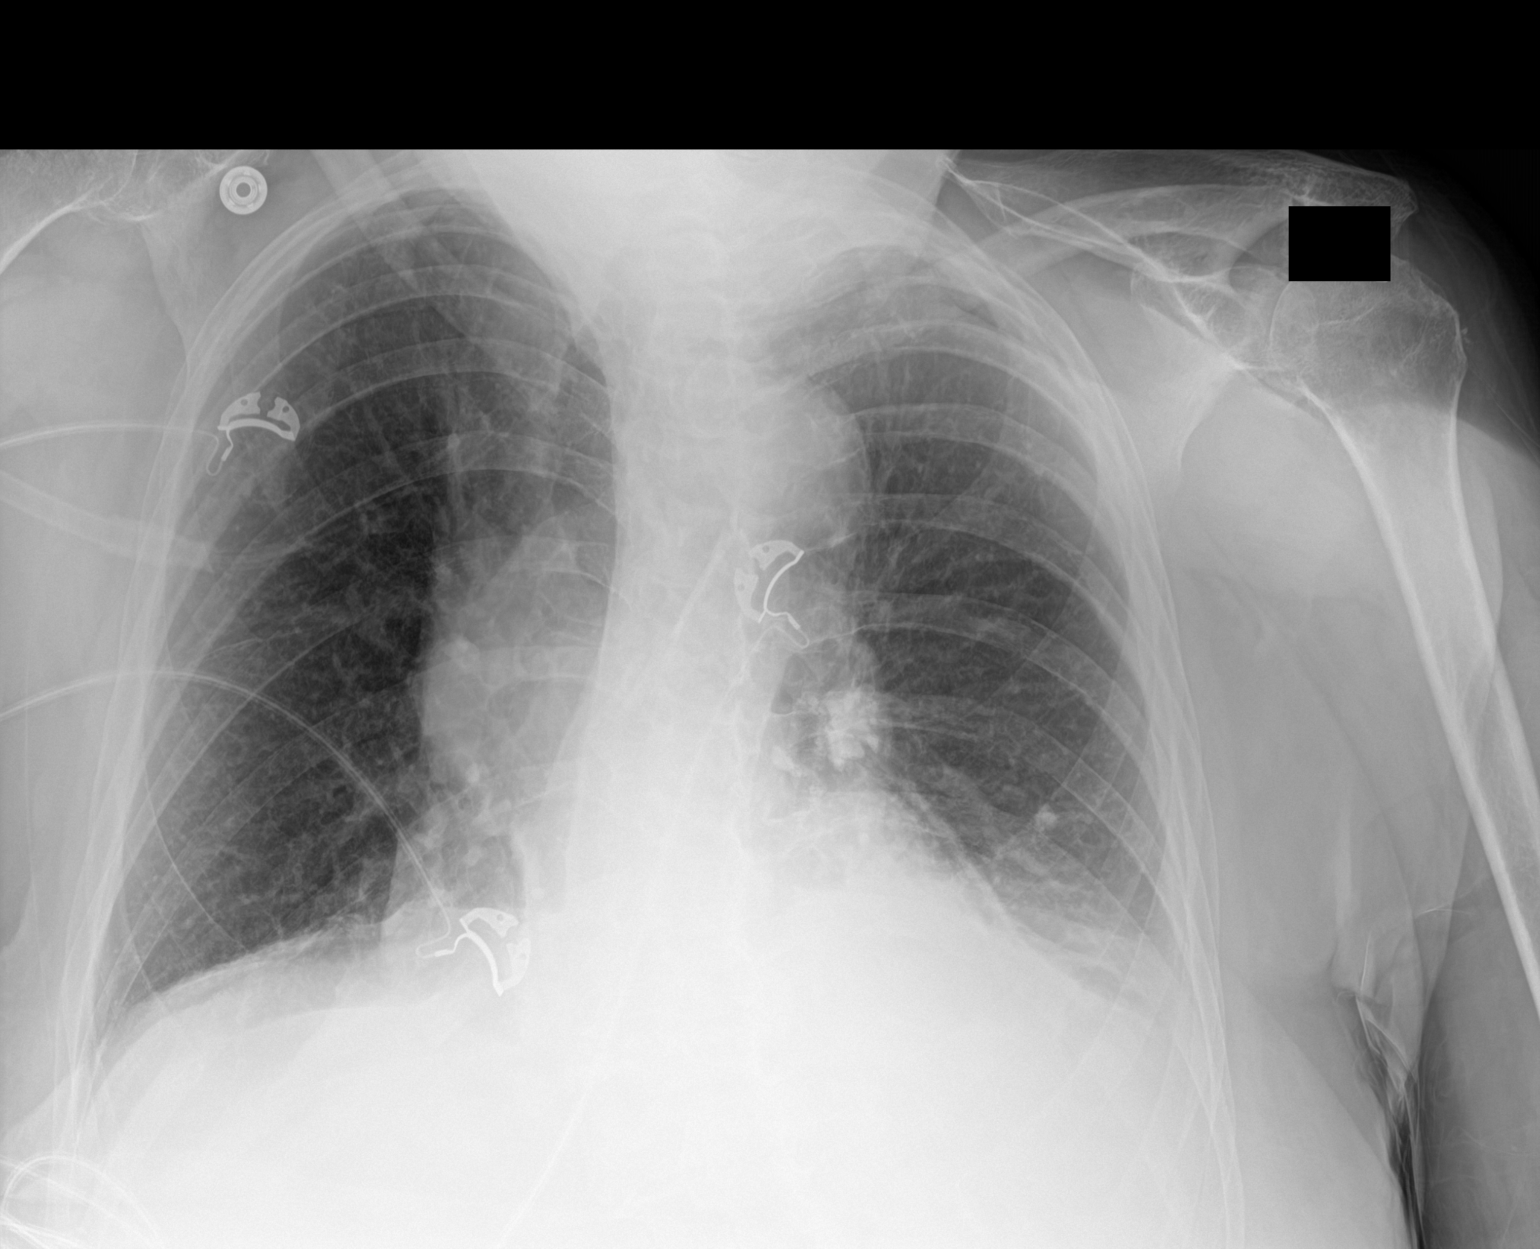

[1 of 1 positions shown; findings below may reference images not displayed]

FINDINGS: Interim removal of endotracheal tube and NG tube. What appears to be
the ascending aorta is accentuated, this most likely second patient
rotation/positioning. Heart size normal. Bibasilar atelectasis again
noted. Mild infiltrate left lung base could not be excluded on
today's exam. Small left pleural effusion. No pneumothorax.
IMPRESSION: Bibasilar atelectasis again noted. Developing left base infiltrate
on today's exam can be excluded. Small left pleural effusion.

## 2020-04-08 DIAGNOSIS — R2689 Other abnormalities of gait and mobility: Secondary | ICD-10-CM | POA: Diagnosis not present

## 2020-04-08 DIAGNOSIS — Z7409 Other reduced mobility: Secondary | ICD-10-CM | POA: Diagnosis not present

## 2020-04-11 DIAGNOSIS — R2689 Other abnormalities of gait and mobility: Secondary | ICD-10-CM | POA: Diagnosis not present

## 2020-04-11 DIAGNOSIS — Z7409 Other reduced mobility: Secondary | ICD-10-CM | POA: Diagnosis not present

## 2020-06-04 DIAGNOSIS — F339 Major depressive disorder, recurrent, unspecified: Secondary | ICD-10-CM | POA: Diagnosis not present

## 2020-06-04 DIAGNOSIS — E785 Hyperlipidemia, unspecified: Secondary | ICD-10-CM | POA: Diagnosis not present

## 2020-06-04 DIAGNOSIS — I10 Essential (primary) hypertension: Secondary | ICD-10-CM | POA: Diagnosis not present

## 2020-06-04 DIAGNOSIS — J449 Chronic obstructive pulmonary disease, unspecified: Secondary | ICD-10-CM | POA: Diagnosis not present

## 2020-06-04 DIAGNOSIS — Z79899 Other long term (current) drug therapy: Secondary | ICD-10-CM | POA: Diagnosis not present

## 2020-06-04 DIAGNOSIS — I77819 Aortic ectasia, unspecified site: Secondary | ICD-10-CM | POA: Diagnosis not present

## 2020-06-04 DIAGNOSIS — Z6833 Body mass index (BMI) 33.0-33.9, adult: Secondary | ICD-10-CM | POA: Diagnosis not present

## 2020-06-04 DIAGNOSIS — R531 Weakness: Secondary | ICD-10-CM | POA: Diagnosis not present

## 2020-06-04 DIAGNOSIS — L299 Pruritus, unspecified: Secondary | ICD-10-CM | POA: Diagnosis not present

## 2020-06-20 DIAGNOSIS — J181 Lobar pneumonia, unspecified organism: Secondary | ICD-10-CM | POA: Diagnosis not present

## 2020-06-20 DIAGNOSIS — G9341 Metabolic encephalopathy: Secondary | ICD-10-CM | POA: Diagnosis present

## 2020-06-20 DIAGNOSIS — R0682 Tachypnea, not elsewhere classified: Secondary | ICD-10-CM | POA: Diagnosis not present

## 2020-06-20 DIAGNOSIS — L03115 Cellulitis of right lower limb: Secondary | ICD-10-CM | POA: Diagnosis not present

## 2020-06-20 DIAGNOSIS — A419 Sepsis, unspecified organism: Secondary | ICD-10-CM | POA: Diagnosis not present

## 2020-06-20 DIAGNOSIS — R6520 Severe sepsis without septic shock: Secondary | ICD-10-CM | POA: Diagnosis not present

## 2020-06-20 DIAGNOSIS — Z9911 Dependence on respirator [ventilator] status: Secondary | ICD-10-CM | POA: Diagnosis not present

## 2020-06-20 DIAGNOSIS — I429 Cardiomyopathy, unspecified: Secondary | ICD-10-CM | POA: Diagnosis present

## 2020-06-20 DIAGNOSIS — J9601 Acute respiratory failure with hypoxia: Secondary | ICD-10-CM | POA: Diagnosis not present

## 2020-06-20 DIAGNOSIS — Z20822 Contact with and (suspected) exposure to covid-19: Secondary | ICD-10-CM | POA: Diagnosis not present

## 2020-06-20 DIAGNOSIS — G9340 Encephalopathy, unspecified: Secondary | ICD-10-CM | POA: Diagnosis not present

## 2020-06-20 DIAGNOSIS — R4182 Altered mental status, unspecified: Secondary | ICD-10-CM | POA: Diagnosis not present

## 2020-06-20 DIAGNOSIS — B372 Candidiasis of skin and nail: Secondary | ICD-10-CM | POA: Diagnosis not present

## 2020-06-20 DIAGNOSIS — J9811 Atelectasis: Secondary | ICD-10-CM | POA: Diagnosis not present

## 2020-06-20 DIAGNOSIS — J9612 Chronic respiratory failure with hypercapnia: Secondary | ICD-10-CM | POA: Diagnosis not present

## 2020-06-20 DIAGNOSIS — L03116 Cellulitis of left lower limb: Secondary | ICD-10-CM | POA: Diagnosis present

## 2020-06-20 DIAGNOSIS — J9690 Respiratory failure, unspecified, unspecified whether with hypoxia or hypercapnia: Secondary | ICD-10-CM | POA: Diagnosis not present

## 2020-06-20 DIAGNOSIS — N179 Acute kidney failure, unspecified: Secondary | ICD-10-CM | POA: Diagnosis not present

## 2020-06-20 DIAGNOSIS — R2243 Localized swelling, mass and lump, lower limb, bilateral: Secondary | ICD-10-CM | POA: Diagnosis not present

## 2020-06-20 DIAGNOSIS — J189 Pneumonia, unspecified organism: Secondary | ICD-10-CM | POA: Diagnosis not present

## 2020-06-20 DIAGNOSIS — E872 Acidosis: Secondary | ICD-10-CM | POA: Diagnosis present

## 2020-06-20 DIAGNOSIS — I491 Atrial premature depolarization: Secondary | ICD-10-CM | POA: Diagnosis not present

## 2020-06-20 DIAGNOSIS — M4800 Spinal stenosis, site unspecified: Secondary | ICD-10-CM | POA: Diagnosis not present

## 2020-06-20 DIAGNOSIS — Z515 Encounter for palliative care: Secondary | ICD-10-CM | POA: Diagnosis not present

## 2020-06-20 DIAGNOSIS — Z781 Physical restraint status: Secondary | ICD-10-CM | POA: Diagnosis not present

## 2020-06-20 DIAGNOSIS — I5022 Chronic systolic (congestive) heart failure: Secondary | ICD-10-CM | POA: Diagnosis present

## 2020-06-20 DIAGNOSIS — I10 Essential (primary) hypertension: Secondary | ICD-10-CM | POA: Diagnosis not present

## 2020-06-20 DIAGNOSIS — I130 Hypertensive heart and chronic kidney disease with heart failure and stage 1 through stage 4 chronic kidney disease, or unspecified chronic kidney disease: Secondary | ICD-10-CM | POA: Diagnosis present

## 2020-06-20 DIAGNOSIS — N183 Chronic kidney disease, stage 3 unspecified: Secondary | ICD-10-CM | POA: Diagnosis present

## 2020-06-20 DIAGNOSIS — J440 Chronic obstructive pulmonary disease with acute lower respiratory infection: Secondary | ICD-10-CM | POA: Diagnosis not present

## 2020-06-20 DIAGNOSIS — A4153 Sepsis due to Serratia: Secondary | ICD-10-CM | POA: Diagnosis present

## 2020-06-20 DIAGNOSIS — R6521 Severe sepsis with septic shock: Secondary | ICD-10-CM | POA: Diagnosis not present

## 2020-06-20 DIAGNOSIS — J9602 Acute respiratory failure with hypercapnia: Secondary | ICD-10-CM | POA: Diagnosis not present

## 2020-06-20 DIAGNOSIS — G894 Chronic pain syndrome: Secondary | ICD-10-CM | POA: Diagnosis not present

## 2020-06-20 DIAGNOSIS — Z66 Do not resuscitate: Secondary | ICD-10-CM | POA: Diagnosis present

## 2020-06-20 DIAGNOSIS — R627 Adult failure to thrive: Secondary | ICD-10-CM | POA: Diagnosis present

## 2020-06-20 DIAGNOSIS — J9600 Acute respiratory failure, unspecified whether with hypoxia or hypercapnia: Secondary | ICD-10-CM | POA: Diagnosis not present

## 2020-06-20 DIAGNOSIS — E870 Hyperosmolality and hypernatremia: Secondary | ICD-10-CM | POA: Diagnosis not present

## 2020-06-20 DIAGNOSIS — B373 Candidiasis of vulva and vagina: Secondary | ICD-10-CM | POA: Diagnosis not present

## 2020-06-20 DIAGNOSIS — Z7401 Bed confinement status: Secondary | ICD-10-CM | POA: Diagnosis not present

## 2020-06-20 DIAGNOSIS — F329 Major depressive disorder, single episode, unspecified: Secondary | ICD-10-CM | POA: Diagnosis not present

## 2020-06-20 DIAGNOSIS — J9621 Acute and chronic respiratory failure with hypoxia: Secondary | ICD-10-CM | POA: Diagnosis not present

## 2020-06-20 DIAGNOSIS — Z9981 Dependence on supplemental oxygen: Secondary | ICD-10-CM | POA: Diagnosis not present

## 2020-06-20 DIAGNOSIS — R52 Pain, unspecified: Secondary | ICD-10-CM | POA: Diagnosis not present

## 2020-06-20 DIAGNOSIS — J690 Pneumonitis due to inhalation of food and vomit: Secondary | ICD-10-CM | POA: Diagnosis not present

## 2020-06-20 DIAGNOSIS — R000 Tachycardia, unspecified: Secondary | ICD-10-CM | POA: Diagnosis not present

## 2020-06-20 DIAGNOSIS — G4733 Obstructive sleep apnea (adult) (pediatric): Secondary | ICD-10-CM | POA: Diagnosis not present

## 2020-06-20 DIAGNOSIS — M2550 Pain in unspecified joint: Secondary | ICD-10-CM | POA: Diagnosis not present

## 2020-06-20 DIAGNOSIS — G8929 Other chronic pain: Secondary | ICD-10-CM | POA: Diagnosis present

## 2020-06-20 DIAGNOSIS — I2510 Atherosclerotic heart disease of native coronary artery without angina pectoris: Secondary | ICD-10-CM | POA: Diagnosis not present

## 2020-06-20 DIAGNOSIS — I493 Ventricular premature depolarization: Secondary | ICD-10-CM | POA: Diagnosis not present

## 2020-06-20 DIAGNOSIS — I4891 Unspecified atrial fibrillation: Secondary | ICD-10-CM | POA: Diagnosis present

## 2020-06-20 DIAGNOSIS — F29 Unspecified psychosis not due to a substance or known physiological condition: Secondary | ICD-10-CM | POA: Diagnosis not present

## 2020-06-20 DIAGNOSIS — J9622 Acute and chronic respiratory failure with hypercapnia: Secondary | ICD-10-CM | POA: Diagnosis present

## 2020-06-20 DIAGNOSIS — R091 Pleurisy: Secondary | ICD-10-CM | POA: Diagnosis not present

## 2020-06-20 DIAGNOSIS — Z4682 Encounter for fitting and adjustment of non-vascular catheter: Secondary | ICD-10-CM | POA: Diagnosis not present

## 2020-06-20 DIAGNOSIS — R918 Other nonspecific abnormal finding of lung field: Secondary | ICD-10-CM | POA: Diagnosis not present

## 2020-06-20 DIAGNOSIS — I4510 Unspecified right bundle-branch block: Secondary | ICD-10-CM | POA: Diagnosis not present

## 2020-06-20 DIAGNOSIS — L03119 Cellulitis of unspecified part of limb: Secondary | ICD-10-CM | POA: Diagnosis not present

## 2020-06-20 DIAGNOSIS — E441 Mild protein-calorie malnutrition: Secondary | ICD-10-CM | POA: Diagnosis present

## 2020-06-20 DIAGNOSIS — M419 Scoliosis, unspecified: Secondary | ICD-10-CM | POA: Diagnosis not present

## 2020-06-20 DIAGNOSIS — J90 Pleural effusion, not elsewhere classified: Secondary | ICD-10-CM | POA: Diagnosis present

## 2020-06-29 DIAGNOSIS — I493 Ventricular premature depolarization: Secondary | ICD-10-CM | POA: Diagnosis not present

## 2020-06-29 DIAGNOSIS — I4510 Unspecified right bundle-branch block: Secondary | ICD-10-CM | POA: Diagnosis not present

## 2020-07-04 DEATH — deceased
